# Patient Record
Sex: Male | Born: 1965 | Race: White | Hispanic: No | Marital: Single | Smoking: Never smoker
Health system: Southern US, Community
[De-identification: ages and names within clinical notes are randomized; demographics above are authoritative.]

## PROBLEM LIST (undated history)

## (undated) DIAGNOSIS — L509 Urticaria, unspecified: Secondary | ICD-10-CM

## (undated) DIAGNOSIS — N200 Calculus of kidney: Secondary | ICD-10-CM

## (undated) DIAGNOSIS — G4730 Sleep apnea, unspecified: Secondary | ICD-10-CM

## (undated) DIAGNOSIS — E785 Hyperlipidemia, unspecified: Secondary | ICD-10-CM

## (undated) DIAGNOSIS — K219 Gastro-esophageal reflux disease without esophagitis: Secondary | ICD-10-CM

## (undated) DIAGNOSIS — Z87442 Personal history of urinary calculi: Secondary | ICD-10-CM

## (undated) DIAGNOSIS — T7840XA Allergy, unspecified, initial encounter: Secondary | ICD-10-CM

## (undated) DIAGNOSIS — M1990 Unspecified osteoarthritis, unspecified site: Secondary | ICD-10-CM

## (undated) DIAGNOSIS — I10 Essential (primary) hypertension: Secondary | ICD-10-CM

## (undated) HISTORY — DX: Allergy, unspecified, initial encounter: T78.40XA

## (undated) HISTORY — DX: Gastro-esophageal reflux disease without esophagitis: K21.9

## (undated) HISTORY — DX: Hyperlipidemia, unspecified: E78.5

## (undated) HISTORY — DX: Calculus of kidney: N20.0

## (undated) HISTORY — DX: Urticaria, unspecified: L50.9

## (undated) HISTORY — PX: LAPAROSCOPIC GASTRIC BANDING: SHX1100

## (undated) HISTORY — PX: SPINE SURGERY: SHX786

## (undated) HISTORY — PX: COLONOSCOPY: SHX174

---

## 1987-09-01 HISTORY — PX: GANGLION CYST EXCISION: SHX1691

## 1998-10-11 ENCOUNTER — Emergency Department (HOSPITAL_COMMUNITY): Admission: EM | Admit: 1998-10-11 | Discharge: 1998-10-11 | Payer: Self-pay | Admitting: Emergency Medicine

## 1999-08-25 ENCOUNTER — Emergency Department (HOSPITAL_COMMUNITY): Admission: EM | Admit: 1999-08-25 | Discharge: 1999-08-25 | Payer: Self-pay | Admitting: Emergency Medicine

## 1999-08-25 ENCOUNTER — Encounter: Payer: Self-pay | Admitting: Emergency Medicine

## 2002-10-22 ENCOUNTER — Ambulatory Visit (HOSPITAL_BASED_OUTPATIENT_CLINIC_OR_DEPARTMENT_OTHER): Admission: RE | Admit: 2002-10-22 | Discharge: 2002-10-22 | Payer: Self-pay | Admitting: Family Medicine

## 2002-12-05 ENCOUNTER — Ambulatory Visit (HOSPITAL_BASED_OUTPATIENT_CLINIC_OR_DEPARTMENT_OTHER): Admission: RE | Admit: 2002-12-05 | Discharge: 2002-12-05 | Payer: Self-pay | Admitting: Family Medicine

## 2005-02-27 ENCOUNTER — Ambulatory Visit: Payer: Self-pay | Admitting: Oncology

## 2005-03-31 ENCOUNTER — Ambulatory Visit (HOSPITAL_COMMUNITY): Admission: RE | Admit: 2005-03-31 | Discharge: 2005-03-31 | Payer: Self-pay | Admitting: Oncology

## 2005-04-16 ENCOUNTER — Ambulatory Visit: Payer: Self-pay | Admitting: Internal Medicine

## 2005-05-18 ENCOUNTER — Ambulatory Visit: Payer: Self-pay | Admitting: Internal Medicine

## 2005-05-19 ENCOUNTER — Ambulatory Visit: Payer: Self-pay | Admitting: Internal Medicine

## 2005-06-01 ENCOUNTER — Encounter: Admission: RE | Admit: 2005-06-01 | Discharge: 2005-08-30 | Payer: Self-pay | Admitting: Internal Medicine

## 2005-07-29 ENCOUNTER — Ambulatory Visit: Payer: Self-pay | Admitting: Oncology

## 2005-08-10 ENCOUNTER — Ambulatory Visit: Payer: Self-pay | Admitting: Internal Medicine

## 2005-11-27 ENCOUNTER — Ambulatory Visit: Payer: Self-pay | Admitting: Oncology

## 2005-12-02 LAB — UIFE/LIGHT CHAINS/TP QN, 24-HR UR
Albumin, U: DETECTED
Free Kappa Lt Chains,Ur: 6.03 mg/dL — ABNORMAL HIGH (ref 0.04–1.51)
Free Kappa/Lambda Ratio: 25.13 ratio — ABNORMAL HIGH (ref 0.46–4.00)
Free Lambda Excretion/Day: 5.04 mg/d
Free Lambda Lt Chains,Ur: 0.24 mg/dL (ref 0.08–1.01)
Free Lt Chn Excr Rate: 126.63 mg/d
Time: 24 hours
Total Protein, Urine-Ur/day: 149 mg/d — ABNORMAL HIGH (ref 10–140)
Total Protein, Urine: 7.1 mg/dL
Volume, Urine: 2100 mL

## 2006-04-13 ENCOUNTER — Ambulatory Visit: Payer: Self-pay | Admitting: Internal Medicine

## 2006-04-15 ENCOUNTER — Ambulatory Visit: Payer: Self-pay | Admitting: Internal Medicine

## 2006-04-20 ENCOUNTER — Emergency Department (HOSPITAL_COMMUNITY): Admission: EM | Admit: 2006-04-20 | Discharge: 2006-04-20 | Payer: Self-pay | Admitting: Emergency Medicine

## 2006-04-27 ENCOUNTER — Emergency Department (HOSPITAL_COMMUNITY): Admission: EM | Admit: 2006-04-27 | Discharge: 2006-04-27 | Payer: Self-pay | Admitting: *Deleted

## 2006-05-18 ENCOUNTER — Ambulatory Visit: Payer: Self-pay | Admitting: Internal Medicine

## 2006-05-31 ENCOUNTER — Ambulatory Visit: Payer: Self-pay | Admitting: Internal Medicine

## 2006-06-10 ENCOUNTER — Ambulatory Visit: Payer: Self-pay | Admitting: Oncology

## 2006-09-09 ENCOUNTER — Ambulatory Visit: Payer: Self-pay | Admitting: Pulmonary Disease

## 2006-12-20 ENCOUNTER — Ambulatory Visit: Payer: Self-pay | Admitting: Oncology

## 2006-12-23 LAB — CBC WITH DIFFERENTIAL/PLATELET
Basophils Absolute: 0 10*3/uL (ref 0.0–0.1)
EOS%: 3 % (ref 0.0–7.0)
HCT: 36.7 % — ABNORMAL LOW (ref 38.7–49.9)
HGB: 13.1 g/dL (ref 13.0–17.1)
LYMPH%: 29.9 % (ref 14.0–48.0)
MCH: 30.8 pg (ref 28.0–33.4)
MCHC: 35.8 g/dL (ref 32.0–35.9)
MONO#: 0.5 10*3/uL (ref 0.1–0.9)
NEUT%: 57.3 % (ref 40.0–75.0)
Platelets: 138 10*3/uL — ABNORMAL LOW (ref 145–400)
lymph#: 1.4 10*3/uL (ref 0.9–3.3)

## 2006-12-27 LAB — IRON AND TIBC
%SAT: 24 % (ref 20–55)
Iron: 88 ug/dL (ref 42–165)
TIBC: 372 ug/dL (ref 215–435)

## 2006-12-27 LAB — SPEP & IFE WITH QIG
Albumin ELP: 62 % (ref 55.8–66.1)
Alpha-1-Globulin: 4 % (ref 2.9–4.9)
Beta 2: 5.4 % (ref 3.2–6.5)
Beta Globulin: 6.8 % (ref 4.7–7.2)
Gamma Globulin: 11.4 % (ref 11.1–18.8)

## 2006-12-27 LAB — FERRITIN: Ferritin: 179 ng/mL (ref 22–322)

## 2006-12-27 LAB — LACTATE DEHYDROGENASE: LDH: 76 U/L — ABNORMAL LOW (ref 94–250)

## 2006-12-27 LAB — KAPPA/LAMBDA LIGHT CHAINS: Kappa:Lambda Ratio: 1.52 (ref 0.26–1.65)

## 2007-09-04 ENCOUNTER — Emergency Department (HOSPITAL_COMMUNITY): Admission: EM | Admit: 2007-09-04 | Discharge: 2007-09-04 | Payer: Self-pay | Admitting: Emergency Medicine

## 2008-03-13 ENCOUNTER — Encounter: Payer: Self-pay | Admitting: Internal Medicine

## 2008-09-06 ENCOUNTER — Encounter: Payer: Self-pay | Admitting: Internal Medicine

## 2009-09-18 ENCOUNTER — Encounter: Admission: RE | Admit: 2009-09-18 | Discharge: 2009-10-16 | Payer: Self-pay | Admitting: Diagnostic Neuroimaging

## 2009-12-19 ENCOUNTER — Encounter: Payer: Self-pay | Admitting: Internal Medicine

## 2010-01-09 ENCOUNTER — Encounter: Admission: RE | Admit: 2010-01-09 | Discharge: 2010-01-09 | Payer: Self-pay | Admitting: Emergency Medicine

## 2010-09-30 NOTE — Consult Note (Signed)
Summary: [facility]  [facility]   Imported By: Sherian Rein 01/28/2010 08:00:46  _____________________________________________________________________  External Attachment:    Type:   Image     Comment:   External Document

## 2011-01-16 NOTE — Assessment & Plan Note (Signed)
[facility]                           GASTROENTEROLOGY OFFICE NOTE   [name], [name]                     MRN:          [id number]  DATE:05/18/2006                            DOB:          02-Feb-1966    CHIEF COMPLAINT:  Followup of abnormal LFTs.   [name] had some sensation of his throat closing up after I started  gemfibrozil.  It turns out that was probably allergies.  He went to the  emergency department and was treated and saw Dr. Lucie Leather and has now been  diagnosed with allergies and gastroesophageal reflux disease.  He is on  Nexium.  His previous workup has indicated a high ferritin and elevated  transaminases.  A hemochromatosis gene test was negative to my recollection.  I do not have that record with me right now.  I have explained this to him.  He is back on the gemfibrozil.  His other medications are listed and  reviewed in the chart.   PHYSICAL EXAMINATION:  VITAL SIGNS:  Weight 293 pounds, pulse 80, blood  pressure 120/76.   ASSESSMENT:  1. Fatty liver with abnormal transaminases, question steatohepatitis.  2. Elevated triglycerides/hypertriglyceridemia.  3. Diabetes mellitus.  4. Obesity.  5. Clinical scenario most compatible with metabolic syndrome and insulin      resistance problems.  I am concerned that he is at high risk of      cirrhosis and significant liver disease just based upon his lab      patterns.   PLAN:  1. Continue current medications.  2. Recheck lipids and hepatic function panel in early October.  3. I am going to make a referral to the [facility] to see what they      can do for his fatty liver.                                   Iva Boop, MD,FACG   CEG/MedQ  DD:  05/19/2006  DT:  05/21/2006  Job #:  [id number]   cc:   Harrel Lemon. Merla Riches, M.D.  Pierce Crane, M.D.

## 2011-01-16 NOTE — Assessment & Plan Note (Signed)
[facility]                             PULMONARY OFFICE NOTE   Juan Nelson, Juan Nelson                     MRN:          [id number]  DATE:09/09/2006                            DOB:          26-May-1966    HISTORY OF PRESENT ILLNESS:  The patient is a 45 year old gentleman who  I have been asked to see for management of obstructive sleep apnea.  The  patient was first diagnosed with sleep apnea in 2004 where he was found  to have respiratory disturbance index of 28 events per hour and O2  saturation as low as 79%.  He returned for a titration study on April  2004 where a pressure of 14 cm appeared to be optimal for him.  The  patient was placed on a CPAP at 12 centimeters and uses a nasal mask.  He does have a heated humidifier and knows how to use it.  The patient  has been keeping up with his mask changes.  The patient typically goes  to bed between 11 and 12, and gets up at 5 a.m. to start his day.  He is  fairly rested in the mornings whenever he arises.  He has worn his CPAP  now consistently for 2 years and feels fairly rested during the day.  The patient has no significant inappropriate daytime sleepiness and  teaches piano.  Note his weight is down about 60 pounds over the last 1  year.   PAST MEDICAL HISTORY:  1. History of hypertension.  2. History of dysrhythmias.  3. History of diabetes.  4. History of dyslipidemia.  5. History of allergic rhinitis.  6. History of sleep apnea as stated above.   CURRENT MEDICATIONS:  1. Phentermine 37.5 mg daily.  2. Toprol XL 50 mg daily.  3. Lasix 20 mg daily.  4. Metformin 1000 mg b.i.d.  5. B 12 daily.  6. Nexium 40 mg daily.  7. Nasonex nasal spray daily.  8. Aspirin 81 mg daily.  9. Gemfibrozil 600 mg daily.  10.Various other vitamins.  11.Lipitor 10 mg daily.  12.Zantac 300 mg nightly.   PATIENT HAS NO KNOWN DRUG ALLERGIES.   SOCIAL HISTORY:  He is single, he has never smoked.   FAMILY HISTORY:  Remarkable for no diseases in first degree relatives.   REVIEW OF SYSTEMS:  As per history of present illness.  Also see patient  intake form documented in the chart.   PHYSICAL EXAMINATION:  GENERAL:  He is a morbidly obese male in no acute  distress.  Blood pressure is 128/74, pulse 73, temperature 97.8, weight  is 282 pounds, his saturation on room air is 97%.  HEENT:  Pupils equal, round, reactive to light and accommodation,  extraocular muscles are intact.  Nares shows deviated septum to the left  with turbinate hypertrophy.  Oropharynx shows elongation of the soft  palate and uvula with side wall narrowing.  NECK:  Supple without JVD or lymphadenopathy.  There is no palpable  thyromegaly.  CHEST:  Totally clear.  CARDIAC:  Reveals regular rate and rhythm, no murmur, rubs, or  gallops.  ABDOMEN:  Soft, nontender, with good bowel sounds.  GENITAL/RECTAL: Was not done and not indicated.  LOWER EXTREMITIES:  Were without edema, pulses were intact distally.  NEUROLOGICAL:  Alert and oriented with no obvious motor deficits.   IMPRESSION:  History of moderate obstructive sleep apnea, for which the  patient is on CPAP.  He seems to be doing fairly well with this and  feels that he is adequately rested and has excellent daytime alertness.  The patient has done fairly well with weight loss and I have told him to  continue doing so.  Since he has lost this weight and has not had his  pressure checked in 4 years it may be worthwhile to do an auto set  machine with download for pressure optimization.  The patient is  interested in this.   PLAN:  1. Auto set device for 2 weeks with download and I will let the      patient know his new optimal pressure.  2. Work on weight loss.  3. The patient will follow up in 12 months or sooner if there is      problems.     Barbaraann Share, MD,FCCP  Electronically Signed    [facility]/MedQ  DD: 09/10/2006  DT: 09/11/2006  Job #:  [id number]   cc:   Iva Boop, MD,FACG

## 2011-01-16 NOTE — Assessment & Plan Note (Signed)
[facility]                                   ON-CALL NOTE   Juan Nelson, Juan Nelson                       MRN:          [id number]  DATE:04/23/2006                            DOB:          May 12, 1966    The reason [name] called stating that he believes he is having an  allergic reaction to his Gemfibrozil.  According to the patient, Dr. Leone Payor  placed him on the Gemfibrozil because of high triglycerides.  He apparently  has a liver problem.  He has had throat swelling and this at times has not  been able to swallow.  He claims to have been to the emergency room and to  his family practitioner who did not diagnosis this problem.  He read that  this could be an allergic reaction.  He does not have a skin rash.  I  advised [name] to discontinue the Gemfibrozil in the event that he may  be having an allergic reaction, although I am not certain whether or not  this is so.  I instructed him to contact Dr. Leone Payor next week to discuss  this further.                                   Barbette Hair. Arlyce Dice, MD, [facility]   RDK/MedQ  DD:  04/23/2006  DT:  04/24/2006  Job #:  [id number]   cc:   Iva Boop, MD, [facility]

## 2011-01-20 NOTE — Assessment & Plan Note (Signed)
[facility]                           GASTROENTEROLOGY OFFICE NOTE   Juan Nelson, Juan Nelson                     MRN:          [id number]  DATE:04/13/2006                            DOB:          07-12-1966    CHIEF COMPLAINT:  Abnormal liver chemistries.   Please see my medical history & physical form for full details of this  visit.   ASSESSMENT:  Juan Nelson has recurrent and chronic abnormal  transaminase's.  Etiology is thought to be due to fatty liver problems.  Serologic workup has been unrevealing in the past and they have improved  over time.  On 08/10/2005 he had an ALT of 58, but otherwise normal hepatic  function panel.  On 03/18/2006 with lab work at [facility] he had an AST of 48, ALT 100.  His glucose was 343.  His triglycerides  were 580, HDL 23.  His TSH was normal.  He was started on Metformin and his  blood sugars have come down into the mid 100 range he tells me.  His other  medications include Lisinopril, Toprol XL, Lasix, aspirin, Phentermine,  multivitamin, and now he is on liver for supplementation, fish oil tabs,  chromium supplement, apple cider vinegar, cinnamon 4 supplement, Biotin, and  vitamin B12 p.o.   ALLERGIES:  There are no known drug allergies.   PAST MEDICAL HISTORY:  Include monoclonal gammopathy of unknown significance  and obesity as well as his diagnosis of diabetes.  He has had  thrombocytopenia in the past of unclear etiology.  He has had a liver upper  limits of normal on CT scan and a ferritin level of 928.  Sleep apnea is  possible.  He has had hypertension.   His serologic workup has included negative Ceruloplasmin and negative ANA,  negative hepatitis B surface antigen, hepatitis C antibody, negative  hepatitis B surface antibody.  He has been anemic in the past as well.   I had planned for a repeat ferritin in this patient as his ferritin was over  900 but less than 1,000  previously.  That has not been done as best I can  tell.   PHYSICAL EXAMINATION:  VITAL SIGNS:  Physical exam shows weight 296 pounds,  pulse 88, blood pressure 138/86.  EYES:  Anicteric.   ASSESSMENT:  1. CT scan has suggested fatty liver and he is a set up for metabolic      syndrome.  The elevated ferritin which I have not yet followed up on      does raise the question of hemochromatosis and we need to test for      that.  I have not explained that to him today as I have realized that      after he has left the office, but I will call him and we will set that      up.  2. I started Lopid 600 mg two times daily (generic gemfibrozil) due to his      hypertriglyceridemia which may in part have been due to his diabetes.  3. He really  needs to stop the supplements.   Further plans pending the above results.  Depending upon what the  hemochromatosis testing shows he may or may not need a referral to a  tertiary liver center, I was thinking of referring him to [facility] regarding  possible fatty liver therapy as they have a research center there regarding  that.                                   Iva Boop, MD, [facility]   CEG/MedQ  DD:  04/13/2006  DT:  04/14/2006  Job #:  [id number]   cc:   Ellamae Sia, M.D.  Pierce Crane, MD

## 2011-05-21 LAB — POCT CARDIAC MARKERS
CKMB, poc: 9.4
Myoglobin, poc: 224
Myoglobin, poc: 242
Operator id: 151321

## 2011-05-21 LAB — D-DIMER, QUANTITATIVE: D-Dimer, Quant: <0.22

## 2011-09-13 ENCOUNTER — Ambulatory Visit (INDEPENDENT_AMBULATORY_CARE_PROVIDER_SITE_OTHER): Payer: BC Managed Care – PPO

## 2011-09-13 DIAGNOSIS — G4700 Insomnia, unspecified: Secondary | ICD-10-CM

## 2011-09-13 DIAGNOSIS — M109 Gout, unspecified: Secondary | ICD-10-CM

## 2011-09-13 DIAGNOSIS — I10 Essential (primary) hypertension: Secondary | ICD-10-CM

## 2012-04-20 ENCOUNTER — Emergency Department (HOSPITAL_COMMUNITY)
Admission: EM | Admit: 2012-04-20 | Discharge: 2012-04-20 | Disposition: A | Discharge status: Home / Self Care | Payer: BC Managed Care – PPO | Attending: Emergency Medicine | Admitting: Emergency Medicine

## 2012-04-20 ENCOUNTER — Encounter (HOSPITAL_COMMUNITY): Payer: Self-pay | Admitting: Emergency Medicine

## 2012-04-20 DIAGNOSIS — X58XXXA Exposure to other specified factors, initial encounter: Secondary | ICD-10-CM | POA: Insufficient documentation

## 2012-04-20 DIAGNOSIS — T7840XA Allergy, unspecified, initial encounter: Secondary | ICD-10-CM | POA: Insufficient documentation

## 2012-04-20 HISTORY — DX: Essential (primary) hypertension: I10

## 2012-04-20 NOTE — ED Notes (Signed)
Pt c/o allergic reaction with hives after eating at [facility]; pt sts took 50 mg benadryl 30 min ago; not SOB or resp distress

## 2012-08-29 ENCOUNTER — Other Ambulatory Visit: Payer: Self-pay | Admitting: Family Medicine

## 2012-08-30 NOTE — Telephone Encounter (Signed)
Please pull chart.

## 2012-09-01 NOTE — Telephone Encounter (Signed)
Needs OV /labs

## 2012-09-01 NOTE — Telephone Encounter (Signed)
Chart pulled to PA pool at nurses station [phone]

## 2012-10-08 ENCOUNTER — Ambulatory Visit (INDEPENDENT_AMBULATORY_CARE_PROVIDER_SITE_OTHER): Payer: BC Managed Care – PPO | Admitting: Emergency Medicine

## 2012-10-08 VITALS — BP 143/77 | HR 88 | Temp 98.2°F | Resp 18 | Ht 73.0 in | Wt 230.0 lb

## 2012-10-08 DIAGNOSIS — Z7721 Contact with and (suspected) exposure to potentially hazardous body fluids: Secondary | ICD-10-CM

## 2012-10-08 DIAGNOSIS — E669 Obesity, unspecified: Secondary | ICD-10-CM

## 2012-10-08 DIAGNOSIS — R7309 Other abnormal glucose: Secondary | ICD-10-CM

## 2012-10-08 DIAGNOSIS — R739 Hyperglycemia, unspecified: Secondary | ICD-10-CM

## 2012-10-08 DIAGNOSIS — R7989 Other specified abnormal findings of blood chemistry: Secondary | ICD-10-CM

## 2012-10-08 DIAGNOSIS — I10 Essential (primary) hypertension: Secondary | ICD-10-CM

## 2012-10-08 LAB — POCT CBC
Hemoglobin: 13.6 g/dL — AB (ref 14.1–18.1)
Lymph, poc: 1.7 (ref 0.6–3.4)
MCH, POC: 30 pg (ref 27–31.2)
MCHC: 33.3 g/dL (ref 31.8–35.4)
MCV: 90.3 fL (ref 80–97)
MID (cbc): 0.4 (ref 0–0.9)
MPV: 10.4 fL (ref 0–99.8)
POC MID %: 6.1 %M (ref 0–12)
WBC: 5.9 10*3/uL (ref 4.6–10.2)

## 2012-10-08 LAB — COMPREHENSIVE METABOLIC PANEL
Albumin: 4.7 g/dL (ref 3.5–5.2)
Alkaline Phosphatase: 102 U/L (ref 39–117)
BUN: 19 mg/dL (ref 6–23)
CO2: 28 mEq/L (ref 19–32)
Calcium: 9.4 mg/dL (ref 8.4–10.5)
Glucose, Bld: 92 mg/dL (ref 70–99)
Potassium: 4.2 mEq/L (ref 3.5–5.3)

## 2012-10-08 LAB — RPR

## 2012-10-08 LAB — HIV ANTIBODY (ROUTINE TESTING W REFLEX): HIV: NONREACTIVE

## 2012-10-08 MED ORDER — LISINOPRIL 5 MG PO TABS: 5.0000 mg | ORAL_TABLET | Freq: Every day | ORAL | Status: DC

## 2012-10-08 NOTE — Progress Notes (Signed)
  Subjective:    Patient ID: Juan Nelson, male    DOB: 06-Aug-1966, 47 y.o.   MRN: [id number]  HPI patient here for blood pressure medicine refill. He is followed by a GI specialist at [facility]. He is having difficulty with significant elevation in his LFTs. He also was diabetic. Had weight loss surgery and since then he has lost over 200 pounds of weight. His medical problems have significantly improved since he had the surgery. Patient had LAP-BAND surgery. He is doing very well. He's had no side effects from his blood pressure medications he did have an allergic reaction since he was last here pumpkin seeds but otherwise has done exceedingly well    Review of Systems     Objective:   Physical Exam HEENT exam is unremarkable. Neck supple. Chest is clear to auscultation and percussion. Heart regular rate no murmurs. Abdomen soft liver spleen not large there no tenderness .  Results for orders placed in visit on 10/08/12  POCT CBC      Result Value Range   WBC 5.9  4.6 - 10.2 K/uL   Lymph, poc 1.7  0.6 - 3.4   POC LYMPH PERCENT 28.6  10 - 50 %L   MID (cbc) 0.4  0 - 0.9   POC MID % 6.1  0 - 12 %M   POC Granulocyte 3.9  2 - 6.9   Granulocyte percent 65.3  37 - 80 %G   RBC 4.53 (*) 4.69 - 6.13 M/uL   Hemoglobin 13.6 (*) 14.1 - 18.1 g/dL   HCT, POC 40.9 (*) 81.1 - 53.7 %   MCV 90.3  80 - 97 fL   MCH, POC 30.0  27 - 31.2 pg   MCHC 33.3  31.8 - 35.4 g/dL   RDW, POC 91.4     Platelet Count, POC 171  142 - 424 K/uL   MPV 10.4  0 - 99.8 fL  GLUCOSE, POCT (MANUAL RESULT ENTRY)      Result Value Range   POC Glucose 88  70 - 99 mg/dl  POCT GLYCOSYLATED HEMOGLOBIN (HGB A1C)      Result Value Range   Hemoglobin A1C 4.7          Assessment & Plan:  Patient has not had STD testing for a number of years. He is requesting these be done now. He has a history of hyperglycemia when he was overweight but this has resolved since LAP-BAND treatment. Routine labs were done today.

## 2013-02-15 ENCOUNTER — Ambulatory Visit (INDEPENDENT_AMBULATORY_CARE_PROVIDER_SITE_OTHER): Payer: BC Managed Care – PPO | Admitting: Physician Assistant

## 2013-02-15 VITALS — BP 128/90 | HR 78 | Temp 98.0°F | Resp 16 | Ht 73.0 in | Wt 232.0 lb

## 2013-02-15 DIAGNOSIS — H109 Unspecified conjunctivitis: Secondary | ICD-10-CM

## 2013-02-15 DIAGNOSIS — H1089 Other conjunctivitis: Secondary | ICD-10-CM

## 2013-02-15 MED ORDER — OFLOXACIN 0.3 % OP SOLN: 1.0000 [drp] | Freq: Four times a day (QID) | OPHTHALMIC | Status: DC

## 2013-02-15 NOTE — Progress Notes (Signed)
  Subjective:    Patient ID: Juan Nelson, male    DOB: 1966-06-10, 47 y.o.   MRN: [id number]  HPI 47 year old male presents with 3 day history or right eye erythema, irritation, FB sensation, and drainage. States symptoms started on 02/13/13 with slight eye redness and has progressively worsened. Admits to a lot of tearing and some purulent drainage with crusting.  No globe pain, vision changes, headache, periorbital pain, or dizziness. He is a contact wearer but he alternates between glasses and contacts - has not worn his contacts in 2-3 weeks.  Patient otherwise healthy with no other concerns today    Review of Systems  Constitutional: Negative for fever and chills.  HENT: Positive for rhinorrhea. Negative for congestion, sore throat and postnasal drip.   Eyes: Positive for discharge, redness and itching. Negative for photophobia, pain and visual disturbance.  Gastrointestinal: Negative for nausea and vomiting.  Neurological: Negative for dizziness and headaches.       Objective:   Physical Exam  Constitutional: He is oriented to person, place, and time. He appears well-developed and well-nourished.  HENT:  Head: Normocephalic and atraumatic.  Right Ear: Hearing, tympanic membrane, external ear and ear canal normal.  Left Ear: Hearing, tympanic membrane, external ear and ear canal normal.  Mouth/Throat: Uvula is midline, oropharynx is clear and moist and mucous membranes are normal.  Eyes: EOM are normal. Pupils are equal, round, and reactive to light. Right conjunctiva is injected. Right conjunctiva has no hemorrhage. Left conjunctiva is not injected. Left conjunctiva has no hemorrhage.  Right eyelid slightly swollen with evidence of crusting and drainage  Neck: Normal range of motion.  Cardiovascular: Normal rate.   Pulmonary/Chest: Effort normal.  Neurological: He is alert and oriented to person, place, and time.  Psychiatric: He has a normal mood and affect. His behavior is  normal. Judgment and thought content normal.          Assessment & Plan:  Bacterial conjunctivitis - Plan: ofloxacin (OCUFLOX) 0.3 % ophthalmic solution  Start Ocuflox qid x 7 days. Warm compresses as needed to help with drainage RTC if symptoms worsen or fail to improve - especially if any vision changes, eye pain, or dizziness occurs.

## 2013-02-15 NOTE — Patient Instructions (Addendum)

## 2013-05-06 ENCOUNTER — Ambulatory Visit (INDEPENDENT_AMBULATORY_CARE_PROVIDER_SITE_OTHER): Payer: BC Managed Care – PPO | Admitting: Family Medicine

## 2013-05-06 ENCOUNTER — Ambulatory Visit: Payer: BC Managed Care – PPO

## 2013-05-06 VITALS — BP 142/90 | HR 86 | Temp 98.4°F | Resp 18 | Ht 72.75 in | Wt 237.0 lb

## 2013-05-06 DIAGNOSIS — M25562 Pain in left knee: Secondary | ICD-10-CM

## 2013-05-06 DIAGNOSIS — M25569 Pain in unspecified knee: Secondary | ICD-10-CM

## 2013-05-06 MED ORDER — DICLOFENAC SODIUM 75 MG PO TBEC: 75.0000 mg | DELAYED_RELEASE_TABLET | Freq: Two times a day (BID) | ORAL | Status: DC

## 2013-05-06 NOTE — Patient Instructions (Signed)
Take the diclofenac twice daily with food  If not improving in a few weeks return and we can inject with cortisone.  Return sooner if needed.

## 2013-05-06 NOTE — Progress Notes (Signed)
Subjective: Worked out vigorously several weeks ago, and has persisting knee pain since when on it or sitting too long.  Miuscles were tight both legs , but right okay now.  No major hx injuries.  Lots of crepitance.  Objective: Crepitance both knees.  No swelling or effusion. Bruise medial left knee ? Etiology.  Good ROM and strength.  Assesment: Knee pain  Plan: Xray [facility] reading (PRIMARY) by  Dr. Alwyn Ren Normal knee  Diclofenac 50 bid.

## 2013-05-07 ENCOUNTER — Telehealth: Payer: Self-pay | Admitting: Family Medicine

## 2013-05-07 NOTE — Telephone Encounter (Signed)
I tried to call the patient back regarding his x-ray report. I left a message on his answering machine for him. The patella is mildly subluxated laterally, but to me that did not seem to be the problem. I don't think that that is a major issue really but told him that if he keeps having problems we'll send him to an orthopedist. He is coming back if needed.

## 2013-05-08 ENCOUNTER — Encounter: Payer: Self-pay | Admitting: Family Medicine

## 2013-05-09 ENCOUNTER — Telehealth: Payer: Self-pay

## 2013-05-09 DIAGNOSIS — M25569 Pain in unspecified knee: Secondary | ICD-10-CM

## 2013-05-09 NOTE — Telephone Encounter (Signed)
Sent referral, to you [facility]

## 2013-05-09 NOTE — Telephone Encounter (Signed)
Pt is calling to let dr hopper know that yes he does want the referral to [facility] dr Despina Hick or [place]   Best number [phone]

## 2013-10-06 ENCOUNTER — Other Ambulatory Visit: Payer: Self-pay | Admitting: Emergency Medicine

## 2013-10-09 ENCOUNTER — Other Ambulatory Visit: Payer: Self-pay | Admitting: Emergency Medicine

## 2013-10-13 ENCOUNTER — Ambulatory Visit: Payer: BC Managed Care – PPO | Admitting: Physician Assistant

## 2013-10-13 VITALS — BP 118/72 | HR 77 | Temp 98.3°F | Resp 16 | Ht 72.75 in | Wt 237.0 lb

## 2013-10-13 DIAGNOSIS — M1710 Unilateral primary osteoarthritis, unspecified knee: Secondary | ICD-10-CM

## 2013-10-13 DIAGNOSIS — IMO0002 Reserved for concepts with insufficient information to code with codable children: Secondary | ICD-10-CM

## 2013-10-13 DIAGNOSIS — I10 Essential (primary) hypertension: Secondary | ICD-10-CM

## 2013-10-13 DIAGNOSIS — Z87442 Personal history of urinary calculi: Secondary | ICD-10-CM

## 2013-10-13 DIAGNOSIS — M170 Bilateral primary osteoarthritis of knee: Secondary | ICD-10-CM

## 2013-10-13 DIAGNOSIS — J309 Allergic rhinitis, unspecified: Secondary | ICD-10-CM

## 2013-10-13 MED ORDER — DICLOFENAC SODIUM 75 MG PO TBEC: 75.0000 mg | DELAYED_RELEASE_TABLET | Freq: Two times a day (BID) | ORAL | Status: DC | PRN

## 2013-10-13 MED ORDER — TAMSULOSIN HCL 0.4 MG PO CAPS: 0.4000 mg | ORAL_CAPSULE | Freq: Every day | ORAL | Status: DC | PRN

## 2013-10-13 MED ORDER — FLUTICASONE PROPIONATE 50 MCG/ACT NA SUSP: 2.0000 | Freq: Every day | NASAL | Status: DC

## 2013-10-13 MED ORDER — LISINOPRIL 5 MG PO TABS: 5.0000 mg | ORAL_TABLET | Freq: Every day | ORAL | Status: DC

## 2013-10-13 NOTE — Patient Instructions (Signed)
Continue lisinopril as directed.  Use the diclofenac if needed for knee pain  Try the fluticasone (Flonase) nasal spray - 2 sprays each nostril once daily.  This will help control your allergies with consistent daily use.  Let us know how this is going - there are other nasal sprays that we could try if not getting good response with this.  You can continue loratadine daily or as needed

## 2013-10-13 NOTE — Progress Notes (Signed)
Subjective:    Patient ID: Juan Nelson, male    DOB: [birth year], 48 y.o.   MRN: [id number]  HPI   Juan Nelson is a very pleasant 48 yr old male here for medication refills.  (1) Lisinopril - has taken for several years.  Tolerates medication well.  Does not check home BPs.  BP is WNL today.  He denies CP, palpitations, SOB, edema.  Recent labs viewable in Epic from [facility] - normal electrolytes, Cr  (2)  Flomax - history of renal stones, has had 4-5 episodes of this in the past.  Would like to have some of this on hand if needed.  Denies urinary symptoms currently.  Is noticing that he is having to get up 1-2 times per night to urinate which is bothersome.  He is thinking of trying saw palmetto for this.  (3)  Diclofenac - significant OA of bilateral knees.  Has seen ortho for this.  Limits exercises to low impact activities.  Has taken Diclofenac in the past with good results, would like RF for prn use.  (4)  Has had worsening allergic rhinitis symptoms recently.  Takes loratadine prn but without complete relief.  Open to trying a nasal steroid  Identifies [facility] as PCP.  No recent CPE in Epic, but notes viewable from [facility] where he has had extensive lab work up in last 6 months    Review of Systems  Constitutional: Negative for fever and chills.  HENT: Positive for congestion and rhinorrhea.   Respiratory: Negative.   Cardiovascular: Negative for chest pain, palpitations and leg swelling.  Gastrointestinal: Negative.   Musculoskeletal: Positive for arthralgias (bilat knees).       Objective:   Physical Exam  Vitals reviewed. Constitutional: He is oriented to person, place, and time. He appears well-developed and well-nourished. No distress.  HENT:  Head: Normocephalic and atraumatic.  Eyes: Conjunctivae are normal. No scleral icterus.  Cardiovascular: Normal rate, regular rhythm, normal heart sounds and intact distal pulses.   Pulmonary/Chest: Effort normal and  breath sounds normal.  Abdominal: Soft. There is no tenderness.  Neurological: He is alert and oriented to person, place, and time.  Skin: Skin is warm and dry.  Psychiatric: He has a normal mood and affect. His behavior is normal.       Assessment & Plan:   1. Hypertension Well controlled on current medication.  With normal metabolic panel at [facility] within the last 6 months will not repeat labs today.   - lisinopril (PRINIVIL,ZESTRIL) 5 MG tablet; Take 1 tablet (5 mg total) by mouth daily.  Dispense: 90 tablet; Refill: 1  2. Osteoarthritis of both knees Ongoing bilateral knee pain.  Improved with avoidance of high impact exercise.  May use diclofenac prn. - diclofenac (VOLTAREN) 75 MG EC tablet; Take 1 tablet (75 mg total) by mouth 2 (two) times daily as needed.  Dispense: 30 tablet; Refill: 1  3. History of kidney stones Will refill Flomax per pt request. - tamsulosin (FLOMAX) 0.4 MG CAPS capsule; Take 1 capsule (0.4 mg total) by mouth daily as needed.  Dispense: 30 capsule; Refill: 0  4. Allergic rhinitis Uncontrolled allergic symptoms.  Continue loratadine daily.  Add Flonase daily.  Pt to report back on Flonase efficacy in the next few weeks - can always try a different nasal steroid +/- azelastine if incomplete relief of symptoms - fluticasone (FLONASE) 50 MCG/ACT nasal spray; Place 2 sprays into both nostrils daily.  Dispense: 16 g; Refill:  5  Pt to call or RTC if worsening or not improving  E. Frances FurbishElizabeth Kihanna Kamiya MHS, PA-C [facility] 2/14/20151:27 PM

## 2013-12-02 ENCOUNTER — Other Ambulatory Visit: Payer: Self-pay | Admitting: Physician Assistant

## 2013-12-13 ENCOUNTER — Other Ambulatory Visit: Payer: Self-pay | Admitting: Physician Assistant

## 2014-01-11 ENCOUNTER — Other Ambulatory Visit: Payer: Self-pay | Admitting: Physician Assistant

## 2014-04-18 ENCOUNTER — Ambulatory Visit (INDEPENDENT_AMBULATORY_CARE_PROVIDER_SITE_OTHER): Payer: BC Managed Care – PPO | Admitting: Family Medicine

## 2014-04-18 VITALS — BP 126/84 | HR 72 | Temp 98.6°F | Resp 16 | Ht 77.0 in | Wt 234.0 lb

## 2014-04-18 DIAGNOSIS — Z87442 Personal history of urinary calculi: Secondary | ICD-10-CM

## 2014-04-18 DIAGNOSIS — I10 Essential (primary) hypertension: Secondary | ICD-10-CM | POA: Insufficient documentation

## 2014-04-18 MED ORDER — TAMSULOSIN HCL 0.4 MG PO CAPS: ORAL_CAPSULE | ORAL | Status: DC

## 2014-04-18 MED ORDER — LISINOPRIL 5 MG PO TABS: ORAL_TABLET | ORAL | Status: DC

## 2014-04-18 NOTE — Progress Notes (Signed)
  Juan Nelson - 48 y.o. male MRN [id number]  Date of birth: 1966-02-23  SUBJECTIVE:  Including CC & ROS.  patient C/O: Med Refills  HPI Juan Nelson is a very pleasant 48 yr old male here for medication refills. History of lag band surgery 4 years ago and [facility] Roney MarionAbdel Malik. [facility] unremarkable with normal LFTs, Normal A1c, and Normal Cr.  (1) Lisinopril - has taken for several years. Tolerates medication well. Does not check home BPs. BP is WNL today. He denies CP, palpitations, SOB, edema.  (2) Flomax - history of renal stones, has had 4-5 episodes of this in the past. Only uses for emergency (3) Diclofenac - history OA of bilateral knees. Has seen ortho for this. Limits exercises to low impact activities. Has taken Diclofenac in the past with good results, would like RF for prn use.    ROS:  Constitutional:  No fever, chills, or fatigue.  Respiratory:  No shortness of breath, cough, or wheezing Cardiovascular:  No palpitations, chest pain or syncope Gastrointestinal:  No nausea, no abdominal pain Review of systems otherwise negative except for what is stated in HPI  HISTORY: Past Medical, Surgical, Social, and Family History Reviewed & Updated per EMR. Pertinent Historical Findings include: History of obesity, HTN well controlled, DM resolved, kidney stones, and bilateral knee OA. No tobacco abuse  PHYSICAL EXAM:  VS: BP:126/84 mmHg  HR:72bpm  TEMP:98.6 F (37 C)(Oral)  RESP:97 %  HT:6\' 5"  (195.6 cm)   WT:234 lb (106.142 kg)  BMI:27.8 PHYSICAL EXAM: General:  Alert and oriented, No acute distress.   HENT:  Normocephalic, Oral mucosa is moist.   Respiratory:  Lungs are Nelson to auscultation, Respirations are non-labored, Symmetrical chest wall expansion.   Cardiovascular:  Normal rate, Regular rhythm, No murmur, Good pulses equal in all extremities, No edema.   Gastrointestinal:  Soft,  Non-tender, Non-distended, Normal bowel sounds, No organomegaly.   Integumentary:  Warm, Dry, No rash.   Neurologic:  Alert, Oriented, No focal defects Psychiatric:  Cooperative, Appropriate mood & affect.    ASSESSMENT & PLAN:  1. Hypertension: very well controlled has been on low dose for years. Has normal BP at home and in the office. Recommended stopping Lisinopril 5mg  for a month check BP 2-3 times a week. If BP remains <130/ <80 he can stop medication completely. He has no DM or renal disease indicated needed of ACE-I otherwise.  Continue 81mg  ASA daily for CAD protection, may started fish oil 1-2g daily for heart health if desired based on patient's request 2. Osteoarthritis of both knees: Intermittent bilateral knee pain. Improved with avoidance of high impact exercise. May use diclofenac prn.  3. History of kidney stones Will refill Flomax per pt request.

## 2014-04-20 NOTE — Progress Notes (Signed)
Patient discussed with Dr. Didiano. Agree with assessment and plan of care per her note.   

## 2014-10-14 ENCOUNTER — Other Ambulatory Visit: Payer: Self-pay | Admitting: Sports Medicine

## 2015-04-08 ENCOUNTER — Other Ambulatory Visit: Payer: Self-pay | Admitting: Internal Medicine

## 2015-04-10 ENCOUNTER — Ambulatory Visit (INDEPENDENT_AMBULATORY_CARE_PROVIDER_SITE_OTHER): Payer: BLUE CROSS/BLUE SHIELD | Admitting: Urgent Care

## 2015-04-10 VITALS — BP 136/92 | HR 77 | Temp 98.3°F | Resp 16 | Ht 77.0 in | Wt 230.0 lb

## 2015-04-10 DIAGNOSIS — Z113 Encounter for screening for infections with a predominantly sexual mode of transmission: Secondary | ICD-10-CM

## 2015-04-10 DIAGNOSIS — I10 Essential (primary) hypertension: Secondary | ICD-10-CM | POA: Diagnosis not present

## 2015-04-10 DIAGNOSIS — N200 Calculus of kidney: Secondary | ICD-10-CM

## 2015-04-10 LAB — COMPLETE METABOLIC PANEL WITH GFR
ALBUMIN: 4.5 g/dL (ref 3.6–5.1)
ALT: 42 U/L (ref 9–46)
AST: 30 U/L (ref 10–35)
Alkaline Phosphatase: 84 U/L (ref 40–115)
BILIRUBIN TOTAL: 0.8 mg/dL (ref 0.2–1.2)
BUN: 13 mg/dL (ref 7–25)
CO2: 26 mmol/L (ref 20–31)
Calcium: 9.7 mg/dL (ref 8.6–10.3)
Chloride: 101 mmol/L (ref 98–110)
Creat: 1.04 mg/dL (ref 0.70–1.33)
GFR, EST NON AFRICAN AMERICAN: 83 mL/min (ref >=60)
GLUCOSE: 77 mg/dL (ref 65–99)
Potassium: 4 mmol/L (ref 3.5–5.3)
SODIUM: 141 mmol/L (ref 135–146)
TOTAL PROTEIN: 6.8 g/dL (ref 6.1–8.1)

## 2015-04-10 MED ORDER — LISINOPRIL 10 MG PO TABS: 10.0000 mg | ORAL_TABLET | Freq: Every day | ORAL | Status: DC

## 2015-04-10 NOTE — Patient Instructions (Signed)
Hypertension Hypertension, commonly called high blood pressure, is when the force of blood pumping through your arteries is too strong. Your arteries are the blood vessels that carry blood from your heart throughout your body. A blood pressure reading consists of a higher number over a lower number, such as 110/72. The higher number (systolic) is the pressure inside your arteries when your heart pumps. The lower number (diastolic) is the pressure inside your arteries when your heart relaxes. Ideally you want your blood pressure below 120/80. Hypertension forces your heart to work harder to pump blood. Your arteries may become narrow or stiff. Having hypertension puts you at risk for heart disease, stroke, and other problems.  RISK FACTORS Some risk factors for high blood pressure are controllable. Others are not.  Risk factors you cannot control include:   Race. You may be at higher risk if you are African American.  Age. Risk increases with age.  Gender. Men are at higher risk than women before age 45 years. After age 65, women are at higher risk than men. Risk factors you can control include:  Not getting enough exercise or physical activity.  Being overweight.  Getting too much fat, sugar, calories, or salt in your diet.  Drinking too much alcohol. SIGNS AND SYMPTOMS Hypertension does not usually cause signs or symptoms. Extremely high blood pressure (hypertensive crisis) may cause headache, anxiety, shortness of breath, and nosebleed. DIAGNOSIS  To check if you have hypertension, your health care provider will measure your blood pressure while you are seated, with your arm held at the level of your heart. It should be measured at least twice using the same arm. Certain conditions can cause a difference in blood pressure between your right and left arms. A blood pressure reading that is higher than normal on one occasion does not mean that you need treatment. If one blood pressure reading  is high, ask your health care provider about having it checked again. TREATMENT  Treating high blood pressure includes making lifestyle changes and possibly taking medicine. Living a healthy lifestyle can help lower high blood pressure. You may need to change some of your habits. Lifestyle changes may include:  Following the DASH diet. This diet is high in fruits, vegetables, and whole grains. It is low in salt, red meat, and added sugars.  Getting at least 2 hours of brisk physical activity every week.  Losing weight if necessary.  Not smoking.  Limiting alcoholic beverages.  Learning ways to reduce stress. If lifestyle changes are not enough to get your blood pressure under control, your health care provider may prescribe medicine. You may need to take more than one. Work closely with your health care provider to understand the risks and benefits. HOME CARE INSTRUCTIONS  Have your blood pressure rechecked as directed by your health care provider.   Take medicines only as directed by your health care provider. Follow the directions carefully. Blood pressure medicines must be taken as prescribed. The medicine does not work as well when you skip doses. Skipping doses also puts you at risk for problems.   Do not smoke.   Monitor your blood pressure at home as directed by your health care provider. SEEK MEDICAL CARE IF:   You think you are having a reaction to medicines taken.  You have recurrent headaches or feel dizzy.  You have swelling in your ankles.  You have trouble with your vision. SEEK IMMEDIATE MEDICAL CARE IF:  You develop a severe headache or confusion.    You have unusual weakness, numbness, or feel faint.  You have severe chest or abdominal pain.  You vomit repeatedly.  You have trouble breathing. MAKE SURE YOU:   Understand these instructions.  Will watch your condition.  Will get help right away if you are not doing well or get worse. Document  Released: 08/17/2005 Document Revised: 01/01/2014 Document Reviewed: 06/09/2013 ExitCare Patient Information 2015 [employer]. This information is not intended to replace advice given to you by your health care provider. Make sure you discuss any questions you have with your health care provider. Kidney Stones Kidney stones (urolithiasis) are deposits that form inside your kidneys. The intense pain is caused by the stone moving through the urinary tract. When the stone moves, the ureter goes into spasm around the stone. The stone is usually passed in the urine.  CAUSES   A disorder that makes certain neck glands produce too much parathyroid hormone (primary hyperparathyroidism).  A buildup of uric acid crystals, similar to gout in your joints.  Narrowing (stricture) of the ureter.  A kidney obstruction present at birth (congenital obstruction).  Previous surgery on the kidney or ureters.  Numerous kidney infections. SYMPTOMS   Feeling sick to your stomach (nauseous).  Throwing up (vomiting).  Blood in the urine (hematuria).  Pain that usually spreads (radiates) to the groin.  Frequency or urgency of urination. DIAGNOSIS   Taking a history and physical exam.  Blood or urine tests.  CT scan.  Occasionally, an examination of the inside of the urinary bladder (cystoscopy) is performed. TREATMENT   Observation.  Increasing your fluid intake.  Extracorporeal shock wave lithotripsy--This is a noninvasive procedure that uses shock waves to break up kidney stones.  Surgery may be needed if you have severe pain or persistent obstruction. There are various surgical procedures. Most of the procedures are performed with the use of small instruments. Only small incisions are needed to accommodate these instruments, so recovery time is minimized. The size, location, and chemical composition are all important variables that will determine the proper choice of action for you. Talk to  your health care provider to better understand your situation so that you will minimize the risk of injury to yourself and your kidney.  HOME CARE INSTRUCTIONS   Drink enough water and fluids to keep your urine clear or pale yellow. This will help you to pass the stone or stone fragments.  Strain all urine through the provided strainer. Keep all particulate matter and stones for your health care provider to see. The stone causing the pain may be as small as a grain of salt. It is very important to use the strainer each and every time you pass your urine. The collection of your stone will allow your health care provider to analyze it and verify that a stone has actually passed. The stone analysis will often identify what you can do to reduce the incidence of recurrences.  Only take over-the-counter or prescription medicines for pain, discomfort, or fever as directed by your health care provider.  Make a follow-up appointment with your health care provider as directed.  Get follow-up X-rays if required. The absence of pain does not always mean that the stone has passed. It may have only stopped moving. If the urine remains completely obstructed, it can cause loss of kidney function or even complete destruction of the kidney. It is your responsibility to make sure X-rays and follow-ups are completed. Ultrasounds of the kidney can show blockages and the status   of the kidney. Ultrasounds are not associated with any radiation and can be performed easily in a matter of minutes. SEEK MEDICAL CARE IF:  You experience pain that is progressive and unresponsive to any pain medicine you have been prescribed. SEEK IMMEDIATE MEDICAL CARE IF:   Pain cannot be controlled with the prescribed medicine.  You have a fever or shaking chills.  The severity or intensity of pain increases over 18 hours and is not relieved by pain medicine.  You develop a new onset of abdominal pain.  You feel faint or pass  out.  You are unable to urinate. MAKE SURE YOU:   Understand these instructions.  Will watch your condition.  Will get help right away if you are not doing well or get worse. Document Released: 08/17/2005 Document Revised: 04/19/2013 Document Reviewed: 01/18/2013 ExitCare Patient Information 2015 [employer]. This information is not intended to replace advice given to you by your health care provider. Make sure you discuss any questions you have with your health care provider.  

## 2015-04-10 NOTE — Progress Notes (Signed)
    MRN: [id number] DOB: May 04, 1966  Subjective:   Juan Nelson is a 49 y.o. male presenting for follow up on Hypertension, STI testing and kidney stone.   HTN - Currently managed with lisinopril. Patient is checking blood pressure at home, range is 130's-140's systolic. Denies lightheadedness, dizziness, chronic headache, double vision, chest pain, shortness of breath, heart racing, palpitations, nausea, vomiting, abdominal pain, hematuria, lower leg swelling.   Stone - has history of kidney stones. Reports 4 day history of flank pain, significantly improved since this morning when he passed a stone which he collected and brought in.  STI - would like to have routine STI testing today. Plans on providing first catch urine sample and bring this at a later time.  Denies any other aggravating or relieving factors, no other questions or concerns.  Juan Nelson has a current medication list which includes the following prescription(s): aspirin ec, cholecalciferol, diclofenac, lisinopril, multivitamin with minerals, and tamsulosin. Also is allergic to pumpkin seed.  Juan Nelson  has a past medical history of Hypertension; Allergy; GERD (gastroesophageal reflux disease); Diabetes mellitus without complication; and Kidney stone. Also  has past surgical history that includes Spine surgery and Laparoscopic gastric banding.  Objective:   Vitals: BP 136/92 mmHg  Pulse 77  Temp(Src) 98.3 F (36.8 C) (Oral)  Resp 16  Ht  (1.956 m)  Wt 230 lb (104.327 kg)  BMI 27.27 kg/m2  SpO2 98%  BP Readings from Last 3 Encounters:  04/10/15 136/92  04/18/14 126/84  10/13/13 118/72   Physical Exam  Constitutional: He is oriented to person, place, and time. He appears well-developed and well-nourished.  HENT:  Mouth/Throat: Oropharynx is clear and moist.  Eyes: Pupils are equal, round, and reactive to light.  Neck: Normal range of motion. Neck supple. No thyromegaly present.  Cardiovascular: Normal rate,  regular rhythm and intact distal pulses.  Exam reveals no gallop and no friction rub.   No murmur heard. Pulmonary/Chest: No respiratory distress. He has no wheezes. He has no rales.  Abdominal: He exhibits no distension and no mass. There is no tenderness (no CVA tenderness).  Musculoskeletal: Normal range of motion. He exhibits no edema or tenderness.  Neurological: He is alert and oriented to person, place, and time.  Skin: Skin is warm and dry. No rash noted. No erythema. No pallor.   Assessment and Plan :   1. Essential hypertension - Will increase from  to  lisinopril. Continue healthy diet, exercise. Follow up in 6 months. - COMPLETE METABOLIC PANEL WITH GFR - lisinopril (PRINIVIL,ZESTRIL) 10 MG tablet; Take 1 tablet (10 mg total) by mouth daily.  Dispense: 90 tablet; Refill: 3  2. Routine screening for STI (sexually transmitted infection) - Labs pending - HIV antibody - RPR - GC/Chlamydia Probe Amp; Future  3. Kidney stone - Stone analysis pending  Wallis Bamberg, PA-C [facility] [phone] 04/10/2015 3:58 PM

## 2015-04-11 LAB — HIV ANTIBODY (ROUTINE TESTING W REFLEX): HIV 1&2 Ab, 4th Generation: NONREACTIVE

## 2015-04-11 LAB — RPR

## 2015-04-13 LAB — STONE ANALYSIS: STONE WEIGHT KSTONE: 0.037 g

## 2015-04-13 NOTE — Addendum Note (Signed)
Addended by: Bronson Curb on: 04/13/2015 11:56 AM   Modules accepted: Orders

## 2015-04-16 LAB — GC/CHLAMYDIA PROBE AMP
CT Probe RNA: NEGATIVE
GC Probe RNA: NEGATIVE

## 2015-07-02 ENCOUNTER — Other Ambulatory Visit: Payer: Self-pay

## 2015-07-02 MED ORDER — DICLOFENAC SODIUM 75 MG PO TBEC: DELAYED_RELEASE_TABLET | ORAL | Status: DC

## 2015-07-02 NOTE — Telephone Encounter (Signed)
That's fine. This is a progressive condition. I'll do a 30 day refill and please let patient know that he should plan to have f/u to make sure this isn't getting worse, needs a referral, etc. Thank you!

## 2015-07-02 NOTE — Telephone Encounter (Signed)
Pharm reqs RF of diclofenac tabs. 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 Kathlene NovemberMike, you recently saw pt for check up but don't see this discussed. Do you want to RF?

## 2015-07-05 NOTE — Telephone Encounter (Signed)
Advised pt on VM of need for f/up.

## 2015-07-29 ENCOUNTER — Encounter: Payer: Self-pay | Admitting: Internal Medicine

## 2015-10-11 ENCOUNTER — Ambulatory Visit (INDEPENDENT_AMBULATORY_CARE_PROVIDER_SITE_OTHER): Payer: BLUE CROSS/BLUE SHIELD | Admitting: Family Medicine

## 2015-10-11 VITALS — BP 126/84 | HR 77 | Temp 98.7°F | Resp 16 | Ht 77.0 in | Wt 224.0 lb

## 2015-10-11 DIAGNOSIS — J209 Acute bronchitis, unspecified: Secondary | ICD-10-CM | POA: Diagnosis not present

## 2015-10-11 MED ORDER — AZITHROMYCIN 250 MG PO TABS: ORAL_TABLET | ORAL | Status: DC

## 2015-10-11 NOTE — Progress Notes (Signed)
 @  By signing my name below, I, Raven Small, attest that this documentation has been prepared under the direction and in the presence of Elvina Sidle, MD.  Electronically Signed: Andrew Au, ED Scribe. 10/11/2015. 12:49 PM.  Patient ID: Juan Nelson MRN: [id number], DOB: 1966-01-10, 50 y.o. Date of Encounter: 10/11/2015, 12:49 PM  Primary Physician: Tonye Pearson, MD  Chief Complaint:  Chief Complaint  Patient presents with  . Medication Refill    diclofenac  . Nasal Congestion    began tuesday, feels congested in chest   . Cough    HPI: 50 y.o. year old male with history below presents with cough and nasal congestion that began 3 days ago. Pt reports symptoms started 4 days ago with hx. He reports associated chest congestion with cough, consisting of brown mucous. Cough does not keep him awake at night.   Pt is a Warden/ranger at [employer].   Past Medical History  Diagnosis Date  . Hypertension   . Allergy   . GERD (gastroesophageal reflux disease)   . Diabetes mellitus without complication (HCC)   . Kidney stone      Home Meds: Prior to Admission medications   Medication Sig Start Date End Date Taking? Authorizing Provider  aspirin EC 81 MG tablet Take 81 mg by mouth daily.   Yes Historical Provider, MD  cholecalciferol (VITAMIN D) 1000 UNITS tablet Take 1,000 Units by mouth daily.   Yes Historical Provider, MD  diclofenac (VOLTAREN) 75 MG EC tablet TAKE 1 TABLET (75 MG TOTAL) BY MOUTH 2 (TWO) TIMES DAILY AS NEEDED. 07/02/15  Yes Wallis Bamberg, PA-C  lisinopril (PRINIVIL,ZESTRIL) 10 MG tablet Take 1 tablet (10 mg total) by mouth daily. 04/10/15  Yes Wallis Bamberg, PA-C  Multiple Vitamin (MULTIVITAMIN WITH MINERALS) TABS Take 1 tablet by mouth daily.   Yes Historical Provider, MD  tamsulosin (FLOMAX) 0.4 MG CAPS capsule Take 1 tablet daily as needed for symptoms of kidney stone 04/18/14  Yes Deanna M Didiano, DO    Allergies:  Allergies  Allergen  Reactions  . Pumpkin Seed Hives and Swelling    Swelling of throat, tongue and lips.    Social History   Social History  . Marital Status: Single    Spouse Name: N/A  . Number of Children: N/A  . Years of Education: N/A   Occupational History  . Not on file.   Social History Main Topics  . Smoking status: Never Smoker   . Smokeless tobacco: Not on file  . Alcohol Use: No  . Drug Use: No  . Sexual Activity: Yes   Other Topics Concern  . Not on file   Social History Narrative     Review of Systems: Constitutional: negative for chills, fever, night sweats, weight changes, or fatigue  HEENT: negative for vision changes, hearing loss, congestion, rhinorrhea, ST, epistaxis, or sinus pressure Cardiovascular: negative for chest pain or palpitations Respiratory: negative for hemoptysis, wheezing, shortness of breath, or cough Abdominal: negative for abdominal pain, nausea, vomiting, diarrhea, or constipation Dermatological: negative for rash Neurologic: negative for headache, dizziness, or syncope All other systems reviewed and are otherwise negative with the exception to those above and in the HPI.   Physical Exam: Blood pressure 126/84, pulse 77, temperature 98.7 F (37.1 C), temperature source Oral, resp. rate 16, height  (1.956 m), weight 224 lb (101.606 kg), SpO2 98 %., Body mass index is 26.56 kg/(m^2). General: Well developed, well nourished, in no acute distress. Head:  Normocephalic, atraumatic, eyes without discharge, sclera non-icteric, nares are without discharge. Bilateral auditory canals clear, TM's are without perforation, pearly grey and translucent with reflective cone of light bilaterally. Oral cavity moist, posterior pharynx without exudate, erythema, peritonsillar abscess, or post nasal drip.  Neck: Supple. No thyromegaly. Full ROM. No lymphadenopathy. Lungs: Clear bilaterally to auscultation without wheezes. Breathing is unlabored.  Few bilateral rhonchi.   Heart: RRR with S1 S2. No murmurs, rubs, or gallops appreciated. Msk:  Strength and tone normal for age. Extremities/Skin: Warm and dry. No clubbing or cyanosis. No edema. No rashes or suspicious lesions. Neuro: Alert and oriented X 3. Moves all extremities spontaneously. Gait is normal. CNII-XII grossly in tact. Psych:  Responds to questions appropriately with a normal affect.    ASSESSMENT AND PLAN:  50 y.o. year old male with bronchitis This chart was scribed in my presence and reviewed by me personally.    ICD-9-CM ICD-10-CM   1. Acute bronchitis, unspecified organism 466.0 J20.9 azithromycin (ZITHROMAX) 250 MG tablet     Signed, Elvina Sidle, MD    Signed, Elvina Sidle, MD 10/11/2015 12:49 PM

## 2015-10-11 NOTE — Patient Instructions (Signed)
Great to see you again, Juan Nelson    Acute Bronchitis Bronchitis is inflammation of the airways that extend from the windpipe into the lungs (bronchi). The inflammation often causes mucus to develop. This leads to a cough, which is the most common symptom of bronchitis.  In acute bronchitis, the condition usually develops suddenly and goes away over time, usually in a couple weeks. Smoking, allergies, and asthma can make bronchitis worse. Repeated episodes of bronchitis may cause further lung problems.  CAUSES Acute bronchitis is most often caused by the same virus that causes a cold. The virus can spread from person to person (contagious) through coughing, sneezing, and touching contaminated objects. SIGNS AND SYMPTOMS   Cough.   Fever.   Coughing up mucus.   Body aches.   Chest congestion.   Chills.   Shortness of breath.   Sore throat.  DIAGNOSIS  Acute bronchitis is usually diagnosed through a physical exam. Your health care provider will also ask you questions about your medical history. Tests, such as chest X-rays, are sometimes done to rule out other conditions.  TREATMENT  Acute bronchitis usually goes away in a couple weeks. Oftentimes, no medical treatment is necessary. Medicines are sometimes given for relief of fever or cough. Antibiotic medicines are usually not needed but may be prescribed in certain situations. In some cases, an inhaler may be recommended to help reduce shortness of breath and control the cough. A cool mist vaporizer may also be used to help thin bronchial secretions and make it easier to clear the chest.  HOME CARE INSTRUCTIONS  Get plenty of rest.   Drink enough fluids to keep your urine clear or pale yellow (unless you have a medical condition that requires fluid restriction). Increasing fluids may help thin your respiratory secretions (sputum) and reduce chest congestion, and it will prevent dehydration.   Take medicines only as directed by  your health care provider.  If you were prescribed an antibiotic medicine, finish it all even if you start to feel better.  Avoid smoking and secondhand smoke. Exposure to cigarette smoke or irritating chemicals will make bronchitis worse. If you are a smoker, consider using nicotine gum or skin patches to help control withdrawal symptoms. Quitting smoking will help your lungs heal faster.   Reduce the chances of another bout of acute bronchitis by washing your hands frequently, avoiding people with cold symptoms, and trying not to touch your hands to your mouth, nose, or eyes.   Keep all follow-up visits as directed by your health care provider.  SEEK MEDICAL CARE IF: Your symptoms do not improve after 1 week of treatment.  SEEK IMMEDIATE MEDICAL CARE IF:  You develop an increased fever or chills.   You have chest pain.   You have severe shortness of breath.  You have bloody sputum.   You develop dehydration.  You faint or repeatedly feel like you are going to pass out.  You develop repeated vomiting.  You develop a severe headache. MAKE SURE YOU:   Understand these instructions.  Will watch your condition.  Will get help right away if you are not doing well or get worse.   This information is not intended to replace advice given to you by your health care provider. Make sure you discuss any questions you have with your health care provider.   Document Released: 09/24/2004 Document Revised: 09/07/2014 Document Reviewed: 02/07/2013 Else[employer] Interactive Patient Education [employer]

## 2016-01-24 ENCOUNTER — Ambulatory Visit (INDEPENDENT_AMBULATORY_CARE_PROVIDER_SITE_OTHER): Payer: BLUE CROSS/BLUE SHIELD

## 2016-01-24 ENCOUNTER — Ambulatory Visit (INDEPENDENT_AMBULATORY_CARE_PROVIDER_SITE_OTHER): Payer: BLUE CROSS/BLUE SHIELD | Admitting: Emergency Medicine

## 2016-01-24 VITALS — BP 128/50 | HR 75 | Temp 98.2°F | Resp 16 | Ht 77.0 in | Wt 228.2 lb

## 2016-01-24 DIAGNOSIS — Z125 Encounter for screening for malignant neoplasm of prostate: Secondary | ICD-10-CM

## 2016-01-24 DIAGNOSIS — Z87442 Personal history of urinary calculi: Secondary | ICD-10-CM

## 2016-01-24 DIAGNOSIS — I10 Essential (primary) hypertension: Secondary | ICD-10-CM | POA: Diagnosis not present

## 2016-01-24 DIAGNOSIS — Z1159 Encounter for screening for other viral diseases: Secondary | ICD-10-CM | POA: Diagnosis not present

## 2016-01-24 DIAGNOSIS — R350 Frequency of micturition: Secondary | ICD-10-CM | POA: Diagnosis not present

## 2016-01-24 LAB — POCT URINALYSIS DIP (MANUAL ENTRY)
Bilirubin, UA: NEGATIVE
Blood, UA: NEGATIVE
GLUCOSE UA: NEGATIVE
Ketones, POC UA: NEGATIVE
LEUKOCYTES UA: NEGATIVE
NITRITE UA: NEGATIVE
PROTEIN UA: NEGATIVE
Spec Grav, UA: 1.02
UROBILINOGEN UA: 0.2
pH, UA: 7

## 2016-01-24 LAB — POCT CBC
GRANULOCYTE PERCENT: 63.7 % (ref 37–80)
HEMATOCRIT: 38.1 % — AB (ref 43.5–53.7)
HEMOGLOBIN: 13.6 g/dL — AB (ref 14.1–18.1)
LYMPH, POC: 1.3 (ref 0.6–3.4)
MCH: 30.9 pg (ref 27–31.2)
MCHC: 35.8 g/dL — AB (ref 31.8–35.4)
MCV: 86.3 fL (ref 80–97)
MID (cbc): 0.5 (ref 0–0.9)
MPV: 7.9 fL (ref 0–99.8)
POC GRANULOCYTE: 3.2 (ref 2–6.9)
POC LYMPH PERCENT: 26.3 %L (ref 10–50)
POC MID %: 10 % (ref 0–12)
Platelet Count, POC: 139 10*3/uL — AB (ref 142–424)
RBC: 4.42 M/uL — AB (ref 4.69–6.13)
RDW, POC: 13.8 %
WBC: 5.1 10*3/uL (ref 4.6–10.2)

## 2016-01-24 LAB — COMPLETE METABOLIC PANEL WITH GFR
ALT: 32 U/L (ref 9–46)
AST: 25 U/L (ref 10–35)
Albumin: 4.6 g/dL (ref 3.6–5.1)
Alkaline Phosphatase: 81 U/L (ref 40–115)
BUN: 14 mg/dL (ref 7–25)
CHLORIDE: 104 mmol/L (ref 98–110)
CO2: 26 mmol/L (ref 20–31)
CREATININE: 1.05 mg/dL (ref 0.70–1.33)
Calcium: 9.2 mg/dL (ref 8.6–10.3)
GFR, Est African American: >89 mL/min (ref >=60)
GFR, Est Non African American: 82 mL/min (ref >=60)
Glucose, Bld: 80 mg/dL (ref 65–99)
Potassium: 4.4 mmol/L (ref 3.5–5.3)
SODIUM: 141 mmol/L (ref 135–146)
TOTAL PROTEIN: 6.7 g/dL (ref 6.1–8.1)
Total Bilirubin: 0.9 mg/dL (ref 0.2–1.2)

## 2016-01-24 LAB — POC MICROSCOPIC URINALYSIS (UMFC): Mucus: ABSENT

## 2016-01-24 LAB — GLUCOSE, POCT (MANUAL RESULT ENTRY): POC GLUCOSE: 71 mg/dL (ref 70–99)

## 2016-01-24 MED ORDER — LISINOPRIL 10 MG PO TABS: 10.0000 mg | ORAL_TABLET | Freq: Every day | ORAL | Status: DC

## 2016-01-24 MED ORDER — DICLOFENAC SODIUM 75 MG PO TBEC: DELAYED_RELEASE_TABLET | ORAL | Status: DC

## 2016-01-24 MED ORDER — TAMSULOSIN HCL 0.4 MG PO CAPS: ORAL_CAPSULE | ORAL | Status: DC

## 2016-01-24 NOTE — Patient Instructions (Addendum)
IF you received an x-ray today, you will receive an invoice from [facility]. Please contact [facility] at [phone] with questions or concerns regarding your invoice.   IF you received labwork today, you will receive an invoice from [employer]. Please contact [facility] at [phone] with questions or concerns regarding your invoice.   Our billing staff will not be able to assist you with questions regarding bills from these companies.  You will be contacted with the lab results as soon as they are available. The fastest way to get your results is to activate your My Chart account. Instructions are located on the last page of this paperwork. If you have not heard from [place] regarding the results in 2 weeks, please contact this office.     Kidney Stones Kidney stones (urolithiasis) are deposits that form inside your kidneys. The intense pain is caused by the stone moving through the urinary tract. When the stone moves, the ureter goes into spasm around the stone. The stone is usually passed in the urine.  CAUSES   A disorder that makes certain neck glands produce too much parathyroid hormone (primary hyperparathyroidism).  A buildup of uric acid crystals, similar to gout in your joints.  Narrowing (stricture) of the ureter.  A kidney obstruction present at birth (congenital obstruction).  Previous surgery on the kidney or ureters.  Numerous kidney infections. SYMPTOMS   Feeling sick to your stomach (nauseous).  Throwing up (vomiting).  Blood in the urine (hematuria).  Pain that usually spreads (radiates) to the groin.  Frequency or urgency of urination. DIAGNOSIS   Taking a history and physical exam.  Blood or urine tests.  CT scan.  Occasionally, an examination of the inside of the urinary bladder (cystoscopy) is performed. TREATMENT   Observation.  Increasing your fluid intake.  Extracorporeal shock wave  lithotripsy--This is a noninvasive procedure that uses shock waves to break up kidney stones.  Surgery may be needed if you have severe pain or persistent obstruction. There are various surgical procedures. Most of the procedures are performed with the use of small instruments. Only small incisions are needed to accommodate these instruments, so recovery time is minimized. The size, location, and chemical composition are all important variables that will determine the proper choice of action for you. Talk to your health care provider to better understand your situation so that you will minimize the risk of injury to yourself and your kidney.  HOME CARE INSTRUCTIONS   Drink enough water and fluids to keep your urine clear or pale yellow. This will help you to pass the stone or stone fragments.  Strain all urine through the provided strainer. Keep all particulate matter and stones for your health care provider to see. The stone causing the pain may be as small as a grain of salt. It is very important to use the strainer each and every time you pass your urine. The collection of your stone will allow your health care provider to analyze it and verify that a stone has actually passed. The stone analysis will often identify what you can do to reduce the incidence of recurrences.  Only take over-the-counter or prescription medicines for pain, discomfort, or fever as directed by your health care provider.  Keep all follow-up visits as told by your health care provider. This is important.  Get follow-up X-rays if required. The absence of pain does not always mean that the stone has passed. It may have only stopped  If the urine remains completely obstructed, it can cause loss of kidney function or even complete destruction of the kidney. It is your responsibility to make sure X-rays and follow-ups are completed. Ultrasounds of the kidney can show blockages and the status of the kidney. Ultrasounds are  not associated with any radiation and can be performed easily in a matter of minutes.  Make changes to your daily diet as told by your health care provider. You may be told to:  Limit the amount of salt that you eat.  Eat 5 or more servings of fruits and vegetables each day.  Limit the amount of meat, poultry, fish, and eggs that you eat.  Collect a 24-hour urine sample as told by your health care provider.You may need to collect another urine sample every 6-12 months. SEEK MEDICAL CARE IF:  You experience pain that is progressive and unresponsive to any pain medicine you have been prescribed. SEEK IMMEDIATE MEDICAL CARE IF:   Pain cannot be controlled with the prescribed medicine.  You have a fever or shaking chills.  The severity or intensity of pain increases over 18 hours and is not relieved by pain medicine.  You develop a new onset of abdominal pain.  You feel faint or pass out.  You are unable to urinate.   This information is not intended to replace advice given to you by your health care provider. Make sure you discuss any questions you have with your health care provider.   Document Released: 08/17/2005 Document Revised: 05/08/2015 Document Reviewed: 01/18/2013 Elsevier Interactive Patient Education 2016 [employer]vier Inc.   

## 2016-01-24 NOTE — Progress Notes (Addendum)
By signing my name below, I, Raven Small, attest that this documentation has been prepared under the direction and in the presence of Lesle Chris, MD.  Electronically Signed: Andrew Au, ED Scribe. 01/24/2016. 1:22 PM.  Chief Complaint:  Chief Complaint  Patient presents with  . Medication Refill    diclofenac sodium  . Flank Pain    pt has hx of kidney infections.    HPI: Juan Nelson is a 50 y.o. male who reports to [facility] today complaining of urinary frequency. He wakes up 2-3 times during the night to urinate. Pt believes symptoms are due to developing a kidney stone. He reports hx of kidney stones with a total of 8-9 stones in his life time. Pt usually takes flomax when having flank pain due to kidney stone but has not needed medication. He has never been seen by urology.  He denies having a stone removed in the past.   Pt has a lap band. Pt states he used to weight 400 lbs.   Pt would like a   Past Medical History  Diagnosis Date  . Hypertension   . Allergy   . GERD (gastroesophageal reflux disease)   . Diabetes mellitus without complication (HCC)   . Kidney stone    Past Surgical History  Procedure Laterality Date  . Spine surgery    . Laparoscopic gastric banding     Social History   Social History  . Marital Status: Single    Spouse Name: N/A  . Number of Children: N/A  . Years of Education: N/A   Social History Main Topics  . Smoking status: Never Smoker   . Smokeless tobacco: None  . Alcohol Use: No  . Drug Use: No  . Sexual Activity: Yes   Other Topics Concern  . None   Social History Narrative   Family History  Problem Relation Age of Onset  . Hypertension Mother   . Diabetes Father   . Cancer Maternal Grandmother   . Depression Maternal Grandfather   . Alzheimer's disease Paternal Grandmother   . Cancer Paternal Grandfather    Allergies  Allergen Reactions  . Pumpkin Seed Hives and Swelling    Swelling of throat, tongue and lips.     Prior to Admission medications   Medication Sig Start Date End Date Taking? Authorizing Provider  aspirin EC 81 MG tablet Take 81 mg by mouth daily.   Yes Historical Provider, MD  cholecalciferol (VITAMIN D) 1000 UNITS tablet Take 1,000 Units by mouth daily.   Yes Historical Provider, MD  diclofenac (VOLTAREN) 75 MG EC tablet TAKE 1 TABLET (75 MG TOTAL) BY MOUTH 2 (TWO) TIMES DAILY AS NEEDED. 07/02/15  Yes Wallis Bamberg, PA-C  lisinopril (PRINIVIL,ZESTRIL) 10 MG tablet Take 1 tablet (10 mg total) by mouth daily. 04/10/15  Yes Wallis Bamberg, PA-C  Multiple Vitamin (MULTIVITAMIN WITH MINERALS) TABS Take 1 tablet by mouth daily.   Yes Historical Provider, MD  tamsulosin (FLOMAX) 0.4 MG CAPS capsule Take 1 tablet daily as needed for symptoms of kidney stone 04/18/14  Yes Deanna M Didiano, DO  azithromycin (ZITHROMAX) 250 MG tablet Take 2 tabs PO x 1 dose, then 1 tab PO QD x 4 days Patient not taking: Reported on 01/24/2016 10/11/15   Elvina Sidle, MD     ROS: The patient denies fevers, chills, night sweats, unintentional weight loss, chest pain, palpitations, wheezing, dyspnea on exertion, nausea, vomiting, abdominal pain, dysuria, hematuria, melena, numbness, weakness, or tingling.  All other systems have been reviewed and were otherwise negative with the exception of those mentioned in the HPI and as above.    PHYSICAL EXAM: Filed Vitals:   01/24/16 1208  BP: 128/50  Pulse: 75  Temp: 98.2 F (36.8 C)  Resp: 16   Body mass index is 27.06 kg/(m^2).   General: Alert, no acute distress HEENT:  Normocephalic, atraumatic, oropharynx patent. Eye: Nonie HoyerOMI, [facility] Cardiovascular:  Regular rate and rhythm, no rubs murmurs or gallops.  No Carotid bruits, radial pulse intact. No pedal edema.  Respiratory: Clear to auscultation bilaterally.  No wheezes, rales, or rhonchi.  No cyanosis, no use of accessory musculature Abdominal: No organomegaly, abdomen is soft and non-tender, positive bowel sounds.  No  masses. Pt has had significant weight loss due to redundant skin present.  Musculoskeletal: Gait intact. No edema, tenderness Skin: No rashes. Neurologic: Facial musculature symmetric. Psychiatric: Patient acts appropriately throughout our interaction. Lymphatic: No cervical or submandibular lymphadenopathy   Results for orders placed or performed in visit on 01/24/16  POCT CBC  Result Value Ref Range   WBC 5.1 4.6 - 10.2 K/uL   Lymph, poc 1.3 0.6 - 3.4   POC LYMPH PERCENT 26.3 10 - 50 %L   MID (cbc) 0.5 0 - 0.9   POC MID % 10.0 0 - 12 %M   POC Granulocyte 3.2 2 - 6.9   Granulocyte percent 63.7 37 - 80 %G   RBC 4.42 (A) 4.69 - 6.13 M/uL   Hemoglobin 13.6 (A) 14.1 - 18.1 g/dL   HCT, POC 16.138.1 (A) 09.643.5 - 53.7 %   MCV 86.3 80 - 97 fL   MCH, POC 30.9 27 - 31.2 pg   MCHC 35.8 (A) 31.8 - 35.4 g/dL   RDW, POC 04.513.8 %   Platelet Count, POC 139 (A) 142 - 424 K/uL   MPV 7.9 0 - 99.8 fL  POCT glucose (manual entry)  Result Value Ref Range   POC Glucose 71 70 - 99 mg/dl  POCT Microscopic Urinalysis ([facility])  Result Value Ref Range   WBC,UR,HPF,POC None None WBC/hpf   RBC,UR,HPF,POC None None RBC/hpf   Bacteria None None, Too numerous to count   Mucus Absent Absent   Epithelial Cells, UR Per Microscopy None None, Too numerous to count cells/hpf  POCT urinalysis dipstick  Result Value Ref Range   Color, UA yellow yellow   Clarity, UA clear clear   Glucose, UA negative negative   Bilirubin, UA negative negative   Ketones, POC UA negative negative   Spec Grav, UA 1.020    Blood, UA negative negative   pH, UA 7.0    Protein Ur, POC negative negative   Urobilinogen, UA 0.2    Nitrite, UA Negative Negative   Leukocytes, UA Negative Negative   Dg Abd 1 View  01/24/2016  CLINICAL DATA:  History of kidney stones. EXAM: ABDOMEN - 1 VIEW COMPARISON:  CT 01/09/2010 FINDINGS: Bowel gas pattern is nonobstructive. Lap band apparatus is present and incompletely visualized. There is an 8 mm  calcification projected over the region of the proximal right ureter between the L2 and 3 seed transverse processes. This may represent a ureteral stone. There are moderate degenerate changes of the hips bone island over the left iliac bone. IMPRESSION: Nonobstructive bowel gas pattern. 8 mm calcification over the right abdomen in the expected region of the proximal ureter as cannot exclude a ureteral stone. Gastric lap band apparatus incompletely visualized. Electronically Signed   By:  Elberta Fortis M.D.   On: 01/24/2016 13:45     ASSESSMENT/PLAN: Patient does have an 8 mm  stone right side of the abdomen. I do think urology referral is indicated. I have placed that order.I personally performed the services described in this documentation, which was scribed in my presence. The recorded information has been reviewed and is accurate.   Gross sideeffects, risk and benefits, and alternatives of medications d/w patient. Patient is aware that all medications have potential sideeffects and we are unable to predict every sideeffect or drug-drug interaction that may occur.  Lesle Chris MD 01/24/2016 1:19 PM

## 2016-01-25 ENCOUNTER — Encounter: Payer: Self-pay | Admitting: *Deleted

## 2016-01-25 LAB — HEPATITIS C ANTIBODY: HCV Ab: NEGATIVE

## 2016-01-25 LAB — PSA: PSA: 0.66 ng/mL (ref <=4.00)

## 2016-03-24 DIAGNOSIS — D2261 Melanocytic nevi of right upper limb, including shoulder: Secondary | ICD-10-CM | POA: Diagnosis not present

## 2016-03-24 DIAGNOSIS — D224 Melanocytic nevi of scalp and neck: Secondary | ICD-10-CM | POA: Diagnosis not present

## 2016-03-24 DIAGNOSIS — D2210 Melanocytic nevi of unspecified eyelid, including canthus: Secondary | ICD-10-CM | POA: Diagnosis not present

## 2016-03-24 DIAGNOSIS — D2211 Melanocytic nevi of right eyelid, including canthus: Secondary | ICD-10-CM | POA: Diagnosis not present

## 2016-03-24 DIAGNOSIS — D225 Melanocytic nevi of trunk: Secondary | ICD-10-CM | POA: Diagnosis not present

## 2016-03-24 DIAGNOSIS — D2262 Melanocytic nevi of left upper limb, including shoulder: Secondary | ICD-10-CM | POA: Diagnosis not present

## 2016-03-30 ENCOUNTER — Other Ambulatory Visit: Payer: Self-pay | Admitting: Emergency Medicine

## 2016-05-07 DIAGNOSIS — N200 Calculus of kidney: Secondary | ICD-10-CM | POA: Diagnosis not present

## 2016-05-26 DIAGNOSIS — K760 Fatty (change of) liver, not elsewhere classified: Secondary | ICD-10-CM | POA: Diagnosis not present

## 2016-06-18 ENCOUNTER — Ambulatory Visit (INDEPENDENT_AMBULATORY_CARE_PROVIDER_SITE_OTHER): Payer: BLUE CROSS/BLUE SHIELD | Admitting: Family Medicine

## 2016-06-18 ENCOUNTER — Ambulatory Visit (INDEPENDENT_AMBULATORY_CARE_PROVIDER_SITE_OTHER): Payer: BLUE CROSS/BLUE SHIELD

## 2016-06-18 VITALS — BP 130/84 | HR 80 | Temp 98.2°F | Resp 17 | Ht 74.0 in | Wt 222.0 lb

## 2016-06-18 DIAGNOSIS — R300 Dysuria: Secondary | ICD-10-CM | POA: Diagnosis not present

## 2016-06-18 DIAGNOSIS — N200 Calculus of kidney: Secondary | ICD-10-CM | POA: Diagnosis not present

## 2016-06-18 DIAGNOSIS — N201 Calculus of ureter: Secondary | ICD-10-CM | POA: Diagnosis not present

## 2016-06-18 DIAGNOSIS — R109 Unspecified abdominal pain: Secondary | ICD-10-CM

## 2016-06-18 LAB — POC MICROSCOPIC URINALYSIS (UMFC): MUCUS RE: ABSENT

## 2016-06-18 LAB — POCT URINALYSIS DIP (MANUAL ENTRY)
Glucose, UA: NEGATIVE
Leukocytes, UA: NEGATIVE
NITRITE UA: NEGATIVE
PH UA: 5
Spec Grav, UA: 1.025
Urobilinogen, UA: 0.2

## 2016-06-18 MED ORDER — HYDROCODONE-ACETAMINOPHEN 5-325 MG PO TABS: 1.0000 | ORAL_TABLET | Freq: Four times a day (QID) | ORAL | 0 refills | Status: DC | PRN

## 2016-06-18 NOTE — Patient Instructions (Addendum)
You are scheduled to see Dr. Hollice EspyGibson today at [facility]  Your appointment is @ 145pm    IF you received an x-ray today, you will receive an invoice from [facility]. Please contact [facility] at [phone] with questions or concerns regarding your invoice.   IF you received labwork today, you will receive an invoice from [facility]. Please contact [facility] at [phone] with questions or concerns regarding your invoice.   Our billing staff will not be able to assist you with questions regarding bills from these companies.  You will be contacted with the lab results as soon as they are available. The fastest way to get your results is to activate your My Chart account. Instructions are located on the last page of this paperwork. If you have not heard from us regarding the results in 2 weeks, please contact this office.

## 2016-06-18 NOTE — Progress Notes (Signed)
Patient ID: Juan Nelson, male    DOB: 12/18/1965, 50 y.o.   MRN: [id number]  PCP: Tonye Pearson, MD  Chief Complaint  Patient presents with  . Nephrolithiasis    Pain started yesterday. hx of kidney stones.   . Medication Refill    voltaren    Subjective:   HPI 50 year old presents for evaluation of right flank pain times one day. He has a history of renal stones and is followed by [facility]. His last visit was in September in which a total of three stones were identified on ultrasound. He presents today with a complaint of radiating persistent right flank pain om severity that he almost went the emergency room last night. He reports sweating, right sided flank pain, he felt  feverish, and nausea.Reports recent onset of dark urine, denies gross blood. Dark urine, no gross blood.  At present he reports more of a dull aching right flank pain that radiates from his back toward the right upper abdomen.    Social History   Social History  . Marital status: Single    Spouse name: N/A  . Number of children: N/A  . Years of education: N/A   Occupational History  . Not on file.   Social History Main Topics  . Smoking status: Never Smoker  . Smokeless tobacco: Not on file  . Alcohol use No  . Drug use: No  . Sexual activity: Yes   Other Topics Concern  . Not on file   Social History Narrative  . No narrative on file   family history includes Alzheimer's disease in his paternal grandmother; Cancer in his maternal grandmother and paternal grandfather; Depression in his maternal grandfather; Diabetes in his father; Hypertension in his mother.   Review of Systems See HPI  Patient Active Problem List   Diagnosis Date Noted  . History of kidney stones 04/18/2014  . Essential hypertension, benign 04/18/2014  . Essential hypertension 04/18/2014  . LFT elevation 10/08/2012  . Hyperglycemia 10/08/2012     Prior to Admission medications   Medication Sig  Start Date End Date Taking? Authorizing Provider  aspirin EC 81 MG tablet Take 81 mg by mouth daily.   Yes Historical Provider, MD  cholecalciferol (VITAMIN D) 1000 UNITS tablet Take 1,000 Units by mouth daily.   Yes Historical Provider, MD  diclofenac (VOLTAREN) 75 MG EC tablet TAKE 1 TABLET (75 MG TOTAL) BY MOUTH 2 (TWO) TIMES DAILY AS NEEDED. 01/24/16  Yes Collene Gobble, MD  lisinopril (PRINIVIL,ZESTRIL) 10 MG tablet Take 1 tablet (10 mg total) by mouth daily. 01/24/16  Yes Collene Gobble, MD  Multiple Vitamin (MULTIVITAMIN WITH MINERALS) TABS Take 1 tablet by mouth daily.   Yes Historical Provider, MD  tamsulosin (FLOMAX) 0.4 MG CAPS capsule Take 1 tablet daily as needed for symptoms of kidney stone 01/24/16  Yes Collene Gobble, MD     Allergies  Allergen Reactions  . Pumpkin Seed Hives and Swelling    Swelling of throat, tongue and lips.   Vitals:   06/18/16 0848  BP: 130/84  Pulse: 80  Resp: 17  Temp: 98.2 F (36.8 C)      Objective:  Physical Exam  Constitutional: He is oriented to person, place, and time. He appears well-developed and well-nourished.  HENT:  Head: Normocephalic and atraumatic.  Right Ear: External ear normal.  Left Ear: External ear normal.  Nose: Nose normal.  Eyes: Conjunctivae are normal. Pupils are equal, round,  and reactive to light.  Neck: Normal range of motion. Neck supple.  Cardiovascular: Normal rate, regular rhythm, normal heart sounds and intact distal pulses.   Pulmonary/Chest: Effort normal and breath sounds normal.  Abdominal: Soft. Bowel sounds are normal. He exhibits no mass. There is tenderness in the suprapubic area. There is CVA tenderness.  Musculoskeletal: Normal range of motion.  Neurological: He is alert and oriented to person, place, and time.  Skin: Skin is warm and dry.  Psychiatric: He has a normal mood and affect. His behavior is normal. Judgment and thought content normal.      Dg Abd 1 View  Result Date:  06/18/2016 CLINICAL DATA:  Hx  of kidney stone. Rt side pain. EXAM: ABDOMEN - 1 VIEW COMPARISON:  05/07/2016 and previous FINDINGS: Normal bowel gas pattern. 9 mm calcification projects in the expected region of the right UPJ. 2 mm calcification projects over the mid right renal collecting system. 5 mm calcification projects over the left lower renal collecting system. Gastric lap band apparatus incompletely visualized. Stable bone island in the left iliac bone. Degenerative changes in bilateral hips right worse than left. IMPRESSION: 1. Bilateral nephrolithiasis with possible 9 mm right UPJ calculus. Electronically Signed   By: Corlis Leak  Hassell M.D.   On: 06/18/2016 10:34   Assessment & Plan:  1. Kidney stone - POCT Microscopic Urinalysis ([facility]) - POCT urinalysis dipstick - DG Abd 1 View; Future  2. Dysuria  50 year old male, presents with a complaint of kidney stones. He was seen and evaluated at [facility] and was later found to have a total of 3 kidney stones. Today he presents symptomatic, in acute pain related to fright sided flank pain with dysuria. KUB in office today shows bilateral stones with a possible Utereropelvic junction obstruction.  Plan: Pain management with Tramadol 50 mg every 8 hours as needed for pain. Emergent referral to [facility] today for further evaluation and treatment.   Godfrey PickKimberly S. Tiburcio PeaHarris, MSN, FNP-C [facility]

## 2016-06-25 DIAGNOSIS — N201 Calculus of ureter: Secondary | ICD-10-CM | POA: Diagnosis not present

## 2016-08-06 DIAGNOSIS — N200 Calculus of kidney: Secondary | ICD-10-CM | POA: Diagnosis not present

## 2016-10-06 DIAGNOSIS — N200 Calculus of kidney: Secondary | ICD-10-CM | POA: Diagnosis not present

## 2016-11-02 ENCOUNTER — Other Ambulatory Visit: Payer: Self-pay | Admitting: Urology

## 2016-11-02 DIAGNOSIS — N200 Calculus of kidney: Secondary | ICD-10-CM | POA: Diagnosis not present

## 2016-11-03 ENCOUNTER — Other Ambulatory Visit: Payer: Self-pay | Admitting: Urology

## 2016-11-03 ENCOUNTER — Encounter (HOSPITAL_COMMUNITY): Payer: Self-pay

## 2016-11-05 ENCOUNTER — Encounter (HOSPITAL_COMMUNITY): Payer: Self-pay | Admitting: *Deleted

## 2016-11-05 ENCOUNTER — Encounter (HOSPITAL_COMMUNITY)
Admission: RE | Disposition: A | Discharge status: Home / Self Care | Payer: Self-pay | Source: Ambulatory Visit | Attending: Urology

## 2016-11-05 ENCOUNTER — Ambulatory Visit (HOSPITAL_COMMUNITY)
Admission: RE | Admit: 2016-11-05 | Discharge: 2016-11-05 | Disposition: A | Discharge status: Home / Self Care | Payer: BLUE CROSS/BLUE SHIELD | Source: Ambulatory Visit | Attending: Urology | Admitting: Urology

## 2016-11-05 ENCOUNTER — Ambulatory Visit (HOSPITAL_COMMUNITY): Payer: BLUE CROSS/BLUE SHIELD

## 2016-11-05 DIAGNOSIS — I10 Essential (primary) hypertension: Secondary | ICD-10-CM | POA: Diagnosis not present

## 2016-11-05 DIAGNOSIS — Z9884 Bariatric surgery status: Secondary | ICD-10-CM | POA: Insufficient documentation

## 2016-11-05 DIAGNOSIS — N201 Calculus of ureter: Secondary | ICD-10-CM | POA: Diagnosis not present

## 2016-11-05 DIAGNOSIS — G4730 Sleep apnea, unspecified: Secondary | ICD-10-CM | POA: Insufficient documentation

## 2016-11-05 DIAGNOSIS — Z01818 Encounter for other preprocedural examination: Secondary | ICD-10-CM | POA: Diagnosis not present

## 2016-11-05 HISTORY — DX: Unspecified osteoarthritis, unspecified site: M19.90

## 2016-11-05 HISTORY — PX: EXTRACORPOREAL SHOCK WAVE LITHOTRIPSY: SHX1557

## 2016-11-05 HISTORY — DX: Sleep apnea, unspecified: G47.30

## 2016-11-05 HISTORY — DX: Personal history of urinary calculi: Z87.442

## 2016-11-05 SURGERY — LITHOTRIPSY, ESWL
Anesthesia: LOCAL | Laterality: Right

## 2016-11-05 MED ORDER — DIAZEPAM 5 MG PO TABS
10.0000 mg | ORAL_TABLET | ORAL | Status: AC
  Administered 2016-11-05: 10 mg via ORAL
  Filled 2016-11-05: qty 2

## 2016-11-05 MED ORDER — CIPROFLOXACIN HCL 500 MG PO TABS
500.0000 mg | ORAL_TABLET | ORAL | Status: AC
  Administered 2016-11-05: 500 mg via ORAL
  Filled 2016-11-05: qty 1

## 2016-11-05 MED ORDER — SODIUM CHLORIDE 0.9 % IV SOLN
INTRAVENOUS | Status: DC
  Administered 2016-11-05: 07:00:00 via INTRAVENOUS

## 2016-11-05 MED ORDER — DIPHENHYDRAMINE HCL 25 MG PO CAPS
25.0000 mg | ORAL_CAPSULE | ORAL | Status: AC
  Administered 2016-11-05: 25 mg via ORAL
  Filled 2016-11-05: qty 1

## 2016-11-05 NOTE — Discharge Instructions (Signed)
Lithotripsy, Care After °This sheet gives you information about how to care for yourself after your procedure. Your health care provider may also give you more specific instructions. If you have problems or questions, contact your health care provider. °What can I expect after the procedure? °After the procedure, it is common to have: °· Some blood in your urine. This should only last for a few days. °· Soreness in your back, sides, or upper abdomen for a few days. °· Blotches or bruises on your back where the pressure wave entered the skin. °· Pain, discomfort, or nausea when pieces (fragments) of the kidney stone move through the tube that carries urine from the kidney to the bladder (ureter). Stone fragments may pass soon after the procedure, but they may continue to pass for up to 4-8 weeks. °¨ If you have severe pain or nausea, contact your health care provider. This may be caused by a large stone that was not broken up, and this may mean that you need more treatment. °· Some pain or discomfort during urination. °· Some pain or discomfort in the lower abdomen or (in men) at the base of the penis. °Follow these instructions at home: °Medicines  °· Take over-the-counter and prescription medicines only as told by your health care provider. °· If you were prescribed an antibiotic medicine, take it as told by your health care provider. Do not stop taking the antibiotic even if you start to feel better. °· Do not drive for 24 hours if you were given a medicine to help you relax (sedative). °· Do not drive or use heavy machinery while taking prescription pain medicine. °Eating and drinking  °· Drink enough water and fluids to keep your urine clear or pale yellow. This helps any remaining pieces of the stone to pass. It can also help prevent new stones from forming. °· Eat plenty of fresh fruits and vegetables. °· Follow instructions from your health care provider about eating and drinking restrictions. You may be  instructed: °¨ To reduce how much salt (sodium) you eat or drink. Check ingredients and nutrition facts on packaged foods and beverages. °¨ To reduce how much meat you eat. °· Eat the recommended amount of calcium for your age and gender. Ask your health care provider how much calcium you should have. °General instructions  °· Get plenty of rest. °· Most people can resume normal activities 1-2 days after the procedure. Ask your health care provider what activities are safe for you. °· If directed, strain all urine through the strainer that was provided by your health care provider. °¨ Keep all fragments for your health care provider to see. Any stones that are found may be sent to a medical lab for examination. The stone may be as small as a grain of salt. °· Keep all follow-up visits as told by your health care provider. This is important. °Contact a health care provider if: °· You have pain that is severe or does not get better with medicine. °· You have nausea that is severe or does not go away. °· You have blood in your urine longer than your health care provider told you to expect. °· You have more blood in your urine. °· You have pain during urination that does not go away. °· You urinate more frequently than usual and this does not go away. °· You develop a rash or any other possible signs of an allergic reaction. °Get help right away if: °· You have severe   pain in your back, sides, or upper abdomen. °· You have severe pain while urinating. °· Your urine is very dark red. °· You have blood in your stool (feces). °· You cannot pass any urine at all. °· You feel a strong urge to urinate after emptying your bladder. °· You have a fever or chills. °· You develop shortness of breath, difficulty breathing, or chest pain. °· You have severe nausea that leads to persistent vomiting. °· You faint. °Summary °· After this procedure, it is common to have some pain, discomfort, or nausea when pieces (fragments) of the  kidney stone move through the tube that carries urine from the kidney to the bladder (ureter). If this pain or nausea is severe, however, you should contact your health care provider. °· Most people can resume normal activities 1-2 days after the procedure. Ask your health care provider what activities are safe for you. °· Drink enough water and fluids to keep your urine clear or pale yellow. This helps any remaining pieces of the stone to pass, and it can help prevent new stones from forming. °· If directed, strain your urine and keep all fragments for your health care provider to see. Fragments or stones may be as small as a grain of salt. °· Get help right away if you have severe pain in your back, sides, or upper abdomen or have severe pain while urinating. °This information is not intended to replace advice given to you by your health care provider. Make sure you discuss any questions you have with your health care provider. °Document Released: 09/06/2007 Document Revised: 07/08/2016 Document Reviewed: 07/08/2016 °Elsevier Interactive Patient Education © 2017 [employer] ° ° °Moderate Conscious Sedation, Adult, Care After °These instructions provide you with information about caring for yourself after your procedure. Your health care provider may also give you more specific instructions. Your treatment has been planned according to current medical practices, but problems sometimes occur. Call your health care provider if you have any problems or questions after your procedure. °What can I expect after the procedure? °After your procedure, it is common: °· To feel sleepy for several hours. °· To feel clumsy and have poor balance for several hours. °· To have poor judgment for several hours. °· To vomit if you eat too soon. °Follow these instructions at home: °For at least 24 hours after the procedure:  ° °· Do not: °¨ Participate in activities where you could fall or become injured. °¨ Drive. °¨ Use heavy  machinery. °¨ Drink alcohol. °¨ Take sleeping pills or medicines that cause drowsiness. °¨ Make important decisions or sign legal documents. °¨ Take care of children on your own. °· Rest. °Eating and drinking  °· Follow the diet recommended by your health care provider. °· If you vomit: °¨ Drink water, juice, or soup when you can drink without vomiting. °¨ Make sure you have little or no nausea before eating solid foods. °General instructions  °· Have a responsible adult stay with you until you are awake and alert. °· Take over-the-counter and prescription medicines only as told by your health care provider. °· If you smoke, do not smoke without supervision. °· Keep all follow-up visits as told by your health care provider. This is important. °Contact a health care provider if: °· You keep feeling nauseous or you keep vomiting. °· You feel light-headed. °· You develop a rash. °· You have a fever. °Get help right away if: °· You have trouble breathing. °This information   is not intended to replace advice given to you by your health care provider. Make sure you discuss any questions you have with your health care provider. °Document Released: 06/07/2013 Document Revised: 01/20/2016 Document Reviewed: 12/07/2015 °Elsevier Interactive Patient Education © 2017 [employer] ° ° °

## 2016-11-12 DIAGNOSIS — N200 Calculus of kidney: Secondary | ICD-10-CM | POA: Diagnosis not present

## 2016-11-15 ENCOUNTER — Emergency Department (HOSPITAL_COMMUNITY): Payer: BLUE CROSS/BLUE SHIELD

## 2016-11-15 ENCOUNTER — Encounter (HOSPITAL_COMMUNITY): Payer: Self-pay

## 2016-11-15 ENCOUNTER — Emergency Department (HOSPITAL_COMMUNITY)
Admission: EM | Admit: 2016-11-15 | Discharge: 2016-11-15 | Disposition: A | Discharge status: Home / Self Care | Payer: BLUE CROSS/BLUE SHIELD | Attending: Emergency Medicine | Admitting: Emergency Medicine

## 2016-11-15 DIAGNOSIS — D649 Anemia, unspecified: Secondary | ICD-10-CM | POA: Diagnosis not present

## 2016-11-15 DIAGNOSIS — N201 Calculus of ureter: Secondary | ICD-10-CM | POA: Insufficient documentation

## 2016-11-15 DIAGNOSIS — R109 Unspecified abdominal pain: Secondary | ICD-10-CM | POA: Diagnosis not present

## 2016-11-15 DIAGNOSIS — I10 Essential (primary) hypertension: Secondary | ICD-10-CM | POA: Insufficient documentation

## 2016-11-15 DIAGNOSIS — Z7982 Long term (current) use of aspirin: Secondary | ICD-10-CM | POA: Diagnosis not present

## 2016-11-15 DIAGNOSIS — Z79899 Other long term (current) drug therapy: Secondary | ICD-10-CM | POA: Insufficient documentation

## 2016-11-15 LAB — CBC WITH DIFFERENTIAL/PLATELET
BASOS ABS: 0 10*3/uL (ref 0.0–0.1)
BASOS PCT: 0 %
EOS ABS: 0 10*3/uL (ref 0.0–0.7)
Eosinophils Relative: 1 %
HEMATOCRIT: 29.4 % — AB (ref 39.0–52.0)
Hemoglobin: 10.2 g/dL — ABNORMAL LOW (ref 13.0–17.0)
Lymphocytes Relative: 8 %
Lymphs Abs: 0.4 10*3/uL — ABNORMAL LOW (ref 0.7–4.0)
MCH: 30 pg (ref 26.0–34.0)
MCHC: 34.7 g/dL (ref 30.0–36.0)
MCV: 86.5 fL (ref 78.0–100.0)
MONO ABS: 0.2 10*3/uL (ref 0.1–1.0)
MONOS PCT: 4 %
Neutro Abs: 4.5 10*3/uL (ref 1.7–7.7)
Neutrophils Relative %: 87 %
Platelets: 186 10*3/uL (ref 150–400)
RBC: 3.4 MIL/uL — ABNORMAL LOW (ref 4.22–5.81)
RDW: 13.4 % (ref 11.5–15.5)
WBC: 5.2 10*3/uL (ref 4.0–10.5)

## 2016-11-15 LAB — URINALYSIS, ROUTINE W REFLEX MICROSCOPIC
BACTERIA UA: NONE SEEN
BILIRUBIN URINE: NEGATIVE
GLUCOSE, UA: NEGATIVE mg/dL
Ketones, ur: NEGATIVE mg/dL
LEUKOCYTES UA: NEGATIVE
NITRITE: NEGATIVE
PH: 5 (ref 5.0–8.0)
Protein, ur: 30 mg/dL — AB
SPECIFIC GRAVITY, URINE: 1.019 (ref 1.005–1.030)
Squamous Epithelial / HPF: NONE SEEN
WBC, UA: NONE SEEN WBC/hpf (ref 0–5)

## 2016-11-15 LAB — BASIC METABOLIC PANEL
Anion gap: 9 (ref 5–15)
BUN: 18 mg/dL (ref 6–20)
CHLORIDE: 106 mmol/L (ref 101–111)
CO2: 26 mmol/L (ref 22–32)
CREATININE: 1.41 mg/dL — AB (ref 0.61–1.24)
Calcium: 9.2 mg/dL (ref 8.9–10.3)
GFR calc Af Amer: >60 mL/min (ref >60)
GFR calc non Af Amer: 56 mL/min — ABNORMAL LOW (ref >60)
Glucose, Bld: 115 mg/dL — ABNORMAL HIGH (ref 65–99)
Potassium: 3.6 mmol/L (ref 3.5–5.1)
SODIUM: 141 mmol/L (ref 135–145)

## 2016-11-15 MED ORDER — ONDANSETRON HCL 4 MG/2ML IJ SOLN
4.0000 mg | Freq: Once | INTRAMUSCULAR | Status: AC
  Administered 2016-11-15: 4 mg via INTRAVENOUS
  Filled 2016-11-15: qty 2

## 2016-11-15 MED ORDER — OXYCODONE-ACETAMINOPHEN 5-325 MG PO TABS: 1.0000 | ORAL_TABLET | ORAL | 0 refills | Status: DC | PRN

## 2016-11-15 MED ORDER — SODIUM CHLORIDE 0.9 % IV BOLUS (SEPSIS)
1000.0000 mL | Freq: Once | INTRAVENOUS | Status: AC
  Administered 2016-11-15: 1000 mL via INTRAVENOUS

## 2016-11-15 MED ORDER — KETOROLAC TROMETHAMINE 30 MG/ML IJ SOLN
30.0000 mg | Freq: Once | INTRAMUSCULAR | Status: AC
  Administered 2016-11-15: 30 mg via INTRAVENOUS
  Filled 2016-11-15: qty 1

## 2016-11-15 MED ORDER — TAMSULOSIN HCL 0.4 MG PO CAPS: ORAL_CAPSULE | ORAL | 1 refills | Status: DC

## 2016-11-15 MED ORDER — ONDANSETRON HCL 4 MG PO TABS: 4.0000 mg | ORAL_TABLET | Freq: Four times a day (QID) | ORAL | 0 refills | Status: DC | PRN

## 2016-11-15 MED ORDER — MORPHINE SULFATE (PF) 4 MG/ML IV SOLN
4.0000 mg | Freq: Once | INTRAVENOUS | Status: AC
  Administered 2016-11-15: 4 mg via INTRAVENOUS
  Filled 2016-11-15: qty 1

## 2016-11-15 NOTE — ED Provider Notes (Signed)
[facility]-EMERGENCY DEPT Provider Note   CSN: [id number] Arrival date & time: 11/15/16  [id number]     History   Chief Complaint Chief Complaint  Patient presents with  . Flank Pain    HPI Juan Nelson is a 51 y.o. male.  He has a history of kidney stones and is 10 days post lithotripsy of stone on the right side. Yesterday morning, he had left flank pain which resolved. 2:30 AM, he had recurrence of left flank pain with associated nausea and vomiting. Pain is typical of what he has had with kidney stones, but he has never vomited before. He rates pain at 9/10. He denies fever, chills, sweats. He has not taken anything for his pain.    Flank Pain     Past Medical History:  Diagnosis Date  . Allergy   . Arthritis    right knee  . GERD (gastroesophageal reflux disease)   . History of kidney stones    multople stones   . Hypertension   . Kidney stone   . Sleep apnea     Patient Active Problem List   Diagnosis Date Noted  . History of kidney stones 04/18/2014  . Essential hypertension, benign 04/18/2014  . Essential hypertension 04/18/2014  . LFT elevation 10/08/2012  . Hyperglycemia 10/08/2012    Past Surgical History:  Procedure Laterality Date  . GANGLION CYST EXCISION  1989  . LAPAROSCOPIC GASTRIC BANDING    . SPINE SURGERY         Home Medications    Prior to Admission medications   Medication Sig Start Date End Date Taking? Authorizing Provider  aspirin EC 81 MG tablet Take 81 mg by mouth daily.    Historical Provider, MD  cholecalciferol (VITAMIN D) 1000 UNITS tablet Take 1,000 Units by mouth daily.    Historical Provider, MD  diclofenac (VOLTAREN) 75 MG EC tablet TAKE 1 TABLET (75 MG TOTAL) BY MOUTH 2 (TWO) TIMES DAILY AS NEEDED. 01/24/16   Collene GobbleSteven A Daub, MD  HYDROcodone-acetaminophen (NORCO) 5-325 MG tablet Take 1-2 tablets by mouth every 6 (six) hours as needed. 06/18/16   Doyle AskewKimberly Stephenia Harris, FNP  lisinopril (PRINIVIL,ZESTRIL) 10 MG tablet Take 1  tablet (10 mg total) by mouth daily. 01/24/16   Collene GobbleSteven A Daub, MD  Multiple Vitamin (MULTIVITAMIN WITH MINERALS) TABS Take 1 tablet by mouth daily.    Historical Provider, MD  tamsulosin (FLOMAX) 0.4 MG CAPS capsule Take 1 tablet daily as needed for symptoms of kidney stone 01/24/16   Collene GobbleSteven A Daub, MD    Family History Family History  Problem Relation Age of Onset  . Hypertension Mother   . Diabetes Father   . Cancer Maternal Grandmother   . Depression Maternal Grandfather   . Alzheimer's disease Paternal Grandmother   . Cancer Paternal Grandfather     Social History Social History  Substance Use Topics  . Smoking status: Never Smoker  . Smokeless tobacco: Never Used  . Alcohol use No     Allergies   Pumpkin seed   Review of Systems Review of Systems  Genitourinary: Positive for flank pain.  All other systems reviewed and are negative.    Physical Exam Updated Vital Signs BP (!) 150/88 (BP Location: Left Arm)   Pulse 80   Temp 98.3 F (36.8 C) (Oral)   Resp 20   Ht 6\' 2"  (1.88 m)   Wt 210 lb (95.3 kg)   SpO2 100%   BMI 26.96 kg/m   Physical Exam  Nursing note and vitals reviewed.  51 year old male, Who appears uncomfortable, but is in no acute distress. Vital signs are significant for systolic hypertension. Oxygen saturation is 100%, which is normal. Head is normocephalic and atraumatic. PERRLA, EOMI. Oropharynx is clear. Neck is nontender and supple without adenopathy or JVD. Back is nontender in the midline. There is mild left CVA tenderness. Lungs are clear without rales, wheezes, or rhonchi. Chest is nontender. Heart has regular rate and rhythm without murmur. Abdomen is soft, flat, nontender without masses or hepatosplenomegaly and peristalsis is hypoactive. Extremities have no cyanosis or edema, full range of motion is present. Skin is pale, warm, and dry without rash. Neurologic: Mental status is normal, cranial nerves are intact, there are no motor  or sensory deficits.  ED Treatments / Results  Labs (all labs ordered are listed, but only abnormal results are displayed) Labs Reviewed  BASIC METABOLIC PANEL - Abnormal; Notable for the following:       Result Value   Glucose, Bld 115 (*)    Creatinine, Ser 1.41 (*)    GFR calc non Af Amer 56 (*)    All other components within normal limits  CBC WITH DIFFERENTIAL/PLATELET - Abnormal; Notable for the following:    RBC 3.40 (*)    Hemoglobin 10.2 (*)    HCT 29.4 (*)    Lymphs Abs 0.4 (*)    All other components within normal limits  URINALYSIS, ROUTINE W REFLEX MICROSCOPIC - Abnormal; Notable for the following:    APPearance HAZY (*)    Hgb urine dipstick LARGE (*)    Protein, ur 30 (*)    All other components within normal limits    Radiology Ct Renal Stone Study  Result Date: 11/15/2016 CLINICAL DATA:  Left flank pain beginning 6 hours ago. Hematuria. History of stone disease with lithotripsy. EXAM: CT ABDOMEN AND PELVIS WITHOUT CONTRAST TECHNIQUE: Multidetector CT imaging of the abdomen and pelvis was performed following the standard protocol without IV contrast. COMPARISON:  Radiography 11/05/2016.  CT 01/09/2010. FINDINGS: Lower chest: Lung bases are clear. No pleural or pericardial fluid. Coronary artery calcification is noted. Hepatobiliary: Liver parenchyma is normal.  No calcified gallstones. Pancreas: Pancreas is normal. Spleen: Normal Adrenals/Urinary Tract: Left adrenal gland is normal. Right adrenal gland is normal. Left kidney contains several small nonobstructing stones, the largest measuring 4 mm in the lower pole. There is a 3 mm stone in the proximal ureter on the left at the level of L3-4. No stones seen below that. No stone in the bladder. On the right, the kidney is markedly swollen and the anatomy is disrupted. This is consistent with intrarenal hemorrhage as well as hemorrhage in the perirenal space. There is a 1 mm stone in the right kidney. No stones seen in the  right ureter. Stomach/Bowel: Previous lap band. No acute or significant bowel finding. Vascular/Lymphatic: Aortic atherosclerosis. No aneurysm. IVC is normal. No retroperitoneal adenopathy. Reproductive: Normal Other: No free fluid or air. Musculoskeletal: Ordinary lumbar degenerative changes. IMPRESSION: 3 mm stone in the left proximal ureter at the level of L3-4. Mild fullness of the left renal collecting system. Several other small nonobstructing left renal calculi, the largest measuring 4 mm in the lower pole. Recent lithotripsy on the right. Renal parenchymal hematoma and perirenal hematoma. Tiny residual right renal calculus. No sign of renal obstruction on the right. Electronically Signed   By: Paulina Fusi M.D.   On: 11/15/2016 07:44    Procedures Procedures (including critical  care time)  Medications Ordered in ED Medications  ondansetron (ZOFRAN) injection 4 mg (not administered)  ketorolac (TORADOL) 30 MG/ML injection 30 mg (not administered)  morphine 4 MG/ML injection 4 mg (not administered)  sodium chloride 0.9 % bolus 1,000 mL (not administered)     Initial Impression / Assessment and Plan / ED Course  I have reviewed the triage vital signs and the nursing notes.  Pertinent labs & imaging results that were available during my care of the patient were reviewed by me and considered in my medical decision making (see chart for details).  Left flank pain consistent with ureteral colic. Old records reviewed confirming recent lithotripsy for right ureteral calculus. He will be sent for renal stone protocol CT scan. He'll be given IV fluids, morphine, ketorolac, ondansetron.  He had good relief of pain with above noted treatment. CT shows evidence of right renal hematoma from recent lithotripsy. There is a 3 mm mid ureteral calculus on the left with hydronephrosis. He already has follow-up arranged with urology because of his lithotripsy. He is discharged with prescriptions for  oxycodone have acetaminophen, ondansetron, and tamsulosin.  Laboratory workup does show anemia which has worsened. This is presumably secondary to blood loss related to his lithotripsy and perinephric hematoma.  Final Clinical Impressions(s) / ED Diagnoses   Final diagnoses:  Left flank pain  Ureterolithiasis  Normochromic normocytic anemia    New Prescriptions New Prescriptions   No medications on file     Dione Booze, MD 11/15/16 [phone]

## 2016-11-15 NOTE — ED Triage Notes (Signed)
States pain in left flank hx of kidney stones with hematuria voiced with nausea and vomiting voiced.

## 2016-11-17 DIAGNOSIS — N200 Calculus of kidney: Secondary | ICD-10-CM | POA: Diagnosis not present

## 2016-11-19 ENCOUNTER — Encounter (HOSPITAL_COMMUNITY): Payer: Self-pay | Admitting: Urology

## 2016-12-01 ENCOUNTER — Ambulatory Visit (INDEPENDENT_AMBULATORY_CARE_PROVIDER_SITE_OTHER): Payer: BLUE CROSS/BLUE SHIELD | Admitting: Family Medicine

## 2016-12-01 VITALS — BP 134/80 | HR 78 | Temp 98.0°F | Resp 17 | Ht 72.5 in | Wt 206.0 lb

## 2016-12-01 DIAGNOSIS — I10 Essential (primary) hypertension: Secondary | ICD-10-CM | POA: Diagnosis not present

## 2016-12-01 DIAGNOSIS — Z13220 Encounter for screening for lipoid disorders: Secondary | ICD-10-CM

## 2016-12-01 DIAGNOSIS — D649 Anemia, unspecified: Secondary | ICD-10-CM | POA: Diagnosis not present

## 2016-12-01 DIAGNOSIS — R739 Hyperglycemia, unspecified: Secondary | ICD-10-CM

## 2016-12-01 DIAGNOSIS — R7989 Other specified abnormal findings of blood chemistry: Secondary | ICD-10-CM

## 2016-12-01 DIAGNOSIS — N202 Calculus of kidney with calculus of ureter: Secondary | ICD-10-CM | POA: Diagnosis not present

## 2016-12-01 MED ORDER — LISINOPRIL 10 MG PO TABS: 10.0000 mg | ORAL_TABLET | Freq: Every day | ORAL | 1 refills | Status: DC

## 2016-12-01 NOTE — Patient Instructions (Addendum)
No change in blood pressure medications today. I will check cholesterol, kidney, liver tests, three-month diabetes test, blood counts. I suspect your kidney function will be better now than what it was a few weeks ago. I will also check your blood count as that was slightly low in the ER. I suspect that will also be improved. Follow-up within 6 months for a physical, sooner if you have new concerns or questions.    IF you received an x-ray today, you will receive an invoice from [facility]. Please contact [facility] at [phone] with questions or concerns regarding your invoice.   IF you received labwork today, you will receive an invoice from [place]. Please contact [facility] at [phone] with questions or concerns regarding your invoice.   Our billing staff will not be able to assist you with questions regarding bills from these companies.  You will be contacted with the lab results as soon as they are available. The fastest way to get your results is to activate your My Chart account. Instructions are located on the last page of this paperwork. If you have not heard from [place] regarding the results in 2 weeks, please contact this office.

## 2016-12-01 NOTE — Progress Notes (Signed)
Subjective:  By signing my name below, I, [name], attest that this documentation has been prepared under the direction and in the presence of Shade Flood, MD Electronically Signed: Charline Bills, ED Scribe 12/01/2016 at 11:58 AM.   Patient ID: Juan Nelson, male    DOB: 1966/01/16, 51 y.o.   MRN: [id number]  Chief Complaint  Patient presents with  . Follow-up    patient wants blood work per patient    HPI Juan Nelson is a 51 y.o. male who presents to Primary Care at [facility] for follow-up and lab work. Pt was last seen by me August 2015. He saw Jerrilyn Cairo oct 2017 for kid stones. H/o HTN, hyperglycemia and elevated LFTs. Pt is fasting at this visit.   HTN Lab Results  Component Value Date   CREATININE 1.41 (H) 11/15/2016  Previous range around 1.05. Most recent draw at time of kidney stone. Pt takes lisinopril 10 mg qd and aspirin. He denies any side effects.    Kidney Stones Last seen in ER 11/15/16. He is followed by urology including prior lithotripsy. Of note, his anemia has worsened at ER visit but thought to be related to lithotripsy and perinephric hematoma. Pt states that he was able to pass a kidney stone, however, he has an upcoming appointment on 4/16 with a nephrologist. Pt states that he passed different materials other than kidney stones.   H/o Elevated LFTs No results found for: CHOL, HDL, LDLCALC, LDLDIRECT, TRIG, CHOLHDL 05/26/16: total was 161, trig 105, LDL 109, HDL 31.   Lab Results  Component Value Date   ALT 32 01/24/2016   AST 25 01/24/2016   ALKPHOS 81 01/24/2016   BILITOT 0.9 01/24/2016   Lab Results  Component Value Date   WBC 5.2 11/15/2016   HGB 10.2 (L) 11/15/2016   HCT 29.4 (L) 11/15/2016   MCV 86.5 11/15/2016   PLT 186 11/15/2016  Pt reports ~200 lb weight loss; states he used to weigh close to 400 lbs. He has changed his diet and is exercising 5 days/week. Pt denies chest pain, chest tightness or sob with exercising.    Hyperglycemia Normal over the past year but elevated at ER visit a few weeks ago at 115.    Patient Active Problem List   Diagnosis Date Noted  . History of kidney stones 04/18/2014  . Essential hypertension, benign 04/18/2014  . Essential hypertension 04/18/2014  . LFT elevation 10/08/2012  . Hyperglycemia 10/08/2012   Past Medical History:  Diagnosis Date  . Allergy   . Arthritis    right knee  . GERD (gastroesophageal reflux disease)   . History of kidney stones    multople stones   . Hypertension   . Kidney stone   . Sleep apnea    Past Surgical History:  Procedure Laterality Date  . EXTRACORPOREAL SHOCK WAVE LITHOTRIPSY Right 11/05/2016   Procedure: RIGHT EXTRACORPOREAL SHOCK WAVE LITHOTRIPSY (ESWL);  Surgeon: Malen Gauze, MD;  Location: [facility];  Service: Urology;  Laterality: Right;  . GANGLION CYST EXCISION  1989  . LAPAROSCOPIC GASTRIC BANDING    . SPINE SURGERY     Allergies  Allergen Reactions  . Pumpkin Seed Hives and Swelling    Swelling of throat, tongue and lips.   Prior to Admission medications   Medication Sig Start Date End Date Taking? Authorizing Provider  aspirin EC 81 MG tablet Take 81 mg by mouth daily.   Yes Historical Provider, MD  cholecalciferol (VITAMIN  D) 1000 UNITS tablet Take 1,000 Units by mouth daily.   Yes Historical Provider, MD  diclofenac (VOLTAREN) 75 MG EC tablet TAKE 1 TABLET (75 MG TOTAL) BY MOUTH 2 (TWO) TIMES DAILY AS NEEDED. Patient taking differently: Take 75 mg by mouth 2 (two) times daily as needed for mild pain or moderate pain.  01/24/16  Yes Collene Gobble, MD  HYDROcodone-acetaminophen (NORCO) 5-325 MG tablet Take 1-2 tablets by mouth every 6 (six) hours as needed. 06/18/16  Yes Doyle Askew, FNP  lisinopril (PRINIVIL,ZESTRIL) 10 MG tablet Take 1 tablet (10 mg total) by mouth daily. 01/24/16  Yes Collene Gobble, MD  Multiple Vitamin (MULTIVITAMIN WITH MINERALS) TABS Take 1 tablet by mouth daily.   Yes  Historical Provider, MD  ondansetron (ZOFRAN) 4 MG tablet Take 1 tablet (4 mg total) by mouth every 6 (six) hours as needed for nausea or vomiting. 11/15/16  Yes Dione Booze, MD  oxyCODONE-acetaminophen (PERCOCET) 5-325 MG tablet Take 1 tablet by mouth every 4 (four) hours as needed for moderate pain. 11/15/16  Yes Dione Booze, MD  tamsulosin [facility]) 0.4 MG CAPS capsule Take 1 tablet daily as needed for symptoms of kidney stone 11/15/16  Yes Dione Booze, MD   Social History   Social History  . Marital status: Single    Spouse name: N/A  . Number of children: N/A  . Years of education: N/A   Occupational History  . Not on file.   Social History Main Topics  . Smoking status: Never Smoker  . Smokeless tobacco: Never Used  . Alcohol use No  . Drug use: No  . Sexual activity: Yes   Other Topics Concern  . Not on file   Social History Narrative  . No narrative on file   Review of Systems  Constitutional: Negative for fatigue and unexpected weight change.  Eyes: Negative for visual disturbance.  Respiratory: Negative for cough, chest tightness and shortness of breath.   Cardiovascular: Negative for chest pain, palpitations and leg swelling.  Gastrointestinal: Negative for abdominal pain and blood in stool.  Neurological: Negative for dizziness, light-headedness and headaches.      Objective:   Physical Exam  Constitutional: He is oriented to person, place, and time. He appears well-developed and well-nourished.  HENT:  Head: Normocephalic and atraumatic.  Eyes: EOM are normal. Pupils are equal, round, and reactive to light.  Neck: No JVD present. Carotid bruit is not present.  Cardiovascular: Normal rate, regular rhythm and normal heart sounds.   No murmur heard. Pulmonary/Chest: Effort normal and breath sounds normal. He has no rales.  Musculoskeletal: He exhibits no edema.  Neurological: He is alert and oriented to person, place, and time.  Skin: Skin is warm and dry.    Psychiatric: He has a normal mood and affect.  Vitals reviewed.  Vitals:   12/01/16 1121  BP: 134/80  Pulse: 78  Resp: 17  Temp: 98 F (36.7 C)  TempSrc: Oral  SpO2: 97%  Weight: 206 lb (93.4 kg)  Height: 6' 0.5" (1.842 m)      Assessment & Plan:   FIONN STRACKE is a 51 y.o. male Elevated serum creatinine - Plan: Comprehensive metabolic panel  -Likely elevated from recent nephrolith. Repeat testing.  Essential hypertension - Plan: lisinopril (PRINIVIL,ZESTRIL) 10 MG tablet  - Stable. Continue lisinopril same dose, CMP pending.  Hyperglycemia - Plan: Hemoglobin A1c  - Check A1c.  Screening for hyperlipidemia - Plan: Lipid panel  Anemia, unspecified type -  Plan: CBC  - Repeat CBC since hospital eval as hematoma likely cause of previous anemia.  Meds ordered this encounter  Medications  . aspirin EC 81 MG tablet    Sig: Take 81 mg by mouth daily.  Marland Kitchen lisinopril (PRINIVIL,ZESTRIL) 10 MG tablet    Sig: Take 1 tablet (10 mg total) by mouth daily.    Dispense:  90 tablet    Refill:  1   Patient Instructions   No change in blood pressure medications today. I will check cholesterol, kidney, liver tests, three-month diabetes test, blood counts. I suspect your kidney function will be better now than what it was a few weeks ago. I will also check your blood count as that was slightly low in the ER. I suspect that will also be improved. Follow-up within 6 months for a physical, sooner if you have new concerns or questions.    IF you received an x-ray today, you will receive an invoice from [facility]. Please contact [facility] at [phone] with questions or concerns regarding your invoice.   IF you received labwork today, you will receive an invoice from [place]. Please contact [facility] at [phone] with questions or concerns regarding your invoice.   Our billing staff will not be able to assist you with questions regarding bills from these  companies.  You will be contacted with the lab results as soon as they are available. The fastest way to get your results is to activate your My Chart account. Instructions are located on the last page of this paperwork. If you have not heard from [place] regarding the results in 2 weeks, please contact this office.       I personally performed the services described in this documentation, which was scribed in my presence. The recorded information has been reviewed and considered for accuracy and completeness, addended by me as needed, and agree with information above.  Signed,   Meredith Staggers, MD Primary Care at [facility].  12/04/16 1:53 PM

## 2016-12-02 LAB — LIPID PANEL
CHOL/HDL RATIO: 3.6 ratio (ref 0.0–5.0)
Cholesterol, Total: 136 mg/dL (ref 100–199)
HDL: 38 mg/dL — ABNORMAL LOW (ref >39)
LDL CALC: 84 mg/dL (ref 0–99)
TRIGLYCERIDES: 70 mg/dL (ref 0–149)
VLDL Cholesterol Cal: 14 mg/dL (ref 5–40)

## 2016-12-02 LAB — COMPREHENSIVE METABOLIC PANEL
ALBUMIN: 4.2 g/dL (ref 3.5–5.5)
ALT: 14 IU/L (ref 0–44)
AST: 19 IU/L (ref 0–40)
Albumin/Globulin Ratio: 1.9 (ref 1.2–2.2)
Alkaline Phosphatase: 102 IU/L (ref 39–117)
BUN/Creatinine Ratio: 12 (ref 9–20)
BUN: 11 mg/dL (ref 6–24)
Bilirubin Total: 0.4 mg/dL (ref 0.0–1.2)
CALCIUM: 8.8 mg/dL (ref 8.7–10.2)
CO2: 23 mmol/L (ref 18–29)
CREATININE: 0.89 mg/dL (ref 0.76–1.27)
Chloride: 101 mmol/L (ref 96–106)
GFR calc Af Amer: 114 mL/min/{1.73_m2} (ref >59)
GFR, EST NON AFRICAN AMERICAN: 98 mL/min/{1.73_m2} (ref >59)
GLOBULIN, TOTAL: 2.2 g/dL (ref 1.5–4.5)
Glucose: 67 mg/dL (ref 65–99)
Potassium: 4.3 mmol/L (ref 3.5–5.2)
SODIUM: 141 mmol/L (ref 134–144)
Total Protein: 6.4 g/dL (ref 6.0–8.5)

## 2016-12-02 LAB — CBC
Hematocrit: 33.2 % — ABNORMAL LOW (ref 37.5–51.0)
Hemoglobin: 10.9 g/dL — ABNORMAL LOW (ref 13.0–17.7)
MCH: 28.8 pg (ref 26.6–33.0)
MCHC: 32.8 g/dL (ref 31.5–35.7)
MCV: 88 fL (ref 79–97)
PLATELETS: 197 10*3/uL (ref 150–379)
RBC: 3.78 x10E6/uL — ABNORMAL LOW (ref 4.14–5.80)
RDW: 14.4 % (ref 12.3–15.4)
WBC: 4.1 10*3/uL (ref 3.4–10.8)

## 2016-12-02 LAB — HEMOGLOBIN A1C
Est. average glucose Bld gHb Est-mCnc: 91 mg/dL
Hgb A1c MFr Bld: 4.8 % (ref 4.8–5.6)

## 2016-12-14 DIAGNOSIS — D649 Anemia, unspecified: Secondary | ICD-10-CM | POA: Diagnosis not present

## 2016-12-14 DIAGNOSIS — E119 Type 2 diabetes mellitus without complications: Secondary | ICD-10-CM | POA: Diagnosis not present

## 2016-12-14 DIAGNOSIS — N200 Calculus of kidney: Secondary | ICD-10-CM | POA: Diagnosis not present

## 2016-12-14 DIAGNOSIS — I10 Essential (primary) hypertension: Secondary | ICD-10-CM | POA: Diagnosis not present

## 2016-12-14 DIAGNOSIS — M109 Gout, unspecified: Secondary | ICD-10-CM | POA: Diagnosis not present

## 2016-12-30 ENCOUNTER — Ambulatory Visit (INDEPENDENT_AMBULATORY_CARE_PROVIDER_SITE_OTHER): Payer: BLUE CROSS/BLUE SHIELD | Admitting: Family Medicine

## 2016-12-30 ENCOUNTER — Encounter: Payer: Self-pay | Admitting: Family Medicine

## 2016-12-30 VITALS — BP 134/86 | HR 64 | Temp 98.7°F | Resp 16 | Ht 72.5 in | Wt 203.0 lb

## 2016-12-30 DIAGNOSIS — Z91018 Allergy to other foods: Secondary | ICD-10-CM | POA: Diagnosis not present

## 2016-12-30 DIAGNOSIS — D649 Anemia, unspecified: Secondary | ICD-10-CM

## 2016-12-30 NOTE — Patient Instructions (Addendum)
  I will refer you to allergist.    If still anemic, can discuss if further evaluation is needed.   IF you received an x-ray today, you will receive an invoice from [facility]. Please contact [facility] at [phone] with questions or concerns regarding your invoice.   IF you received labwork today, you will receive an invoice from [place]. Please contact [facility] at [phone] with questions or concerns regarding your invoice.   Our billing staff will not be able to assist you with questions regarding bills from these companies.  You will be contacted with the lab results as soon as they are available. The fastest way to get your results is to activate your My Chart account. Instructions are located on the last page of this paperwork. If you have not heard from [place] regarding the results in 2 weeks, please contact this office.

## 2016-12-30 NOTE — Progress Notes (Signed)
Subjective:  By signing my name below, I, Juan Nelson, attest that this documentation has been prepared under the direction and in the presence of Juan Flood, MD Electronically Signed: Charline Bills, ED Scribe 12/30/2016 at 8:20 AM.   Patient ID: Juan Nelson, male    DOB: [birth year], 51 y.o.   MRN: [id number]  Chief Complaint  Patient presents with  . Follow-up    labs, needs referral to allergist   HPI  Juan Nelson is a 51 y.o. male who presents to Primary Care at [facility] for follow-up. Last seen 4/3. Noted to have elevated creatinine from previous ER visit. Thought to be due to recent kidney stone and anemia thought to be due to hematoma. Creatinine normalized to 0.89. Other electrolytes were okay. Pt was still anemic with hemoglobin 10.9. Here for recheck cbc and referral to allergist.  Lab Results  Component Value Date   WBC 4.1 12/01/2016   HGB 10.2 (L) 11/15/2016   HCT 33.2 (L) 12/01/2016   MCV 88 12/01/2016   PLT 197 12/01/2016  He reports a h/o anemia in the 1990s with unknown etiology. Pt states that his mother has a h/o chronic anemia as well. Pt denies melena, nosebleeds, hematuria, bleeding gums.   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 He was last seen by an allergist ~10 years ago. Pt avoids consuming other seeds due to unknown reactions but has noticed sneezing after consuming certain foods. He denies hives, swelling, tightening of the throat with other foods. He does not use any seasonal allergy treatments.   Patient Active Problem List   Diagnosis Date Noted  . History of kidney stones 04/18/2014  . Essential hypertension, benign 04/18/2014  . Essential hypertension 04/18/2014  . LFT elevation 10/08/2012  . Hyperglycemia 10/08/2012   Past Medical History:  Diagnosis Date  . Allergy   . Arthritis    right knee  . GERD (gastroesophageal reflux disease)   .  History of kidney stones    multople stones   . Hypertension   . Kidney stone   . Sleep apnea    Past Surgical History:  Procedure Laterality Date  . EXTRACORPOREAL SHOCK WAVE LITHOTRIPSY Right 11/05/2016   Procedure: RIGHT EXTRACORPOREAL SHOCK WAVE LITHOTRIPSY (ESWL);  Surgeon: Malen Gauze, MD;  Location: [facility];  Service: Urology;  Laterality: Right;  . GANGLION CYST EXCISION  1989  . LAPAROSCOPIC GASTRIC BANDING    . SPINE SURGERY     Allergies  Allergen Reactions  . Pumpkin Seed Hives and Swelling    Swelling of throat, tongue and lips.   Prior to Admission medications   Medication Sig Start Date End Date Taking? Authorizing Provider  aspirin EC 81 MG tablet Take 81 mg by mouth daily.    Historical Provider, MD  cholecalciferol (VITAMIN D) 1000 UNITS tablet Take 1,000 Units by mouth daily.    Historical Provider, MD  diclofenac (VOLTAREN) 75 MG EC tablet TAKE 1 TABLET (75 MG TOTAL) BY MOUTH 2 (TWO) TIMES DAILY AS NEEDED. Patient taking differently: Take 75 mg by mouth 2 (two) times daily as needed for mild pain or moderate pain.  01/24/16   Collene Gobble, MD  HYDROcodone-acetaminophen (NORCO) 5-325 MG tablet Take 1-2 tablets by mouth every 6 (six) hours as needed. 06/18/16   Bing Neighbors, FNP  lisinopril (PRINIVIL,ZESTRIL) 10 MG tablet Take 1 tablet (10 mg total) by  mouth daily. 12/01/16   Juan Flood, MD  Multiple Vitamin (MULTIVITAMIN WITH MINERALS) TABS Take 1 tablet by mouth daily.    Historical Provider, MD  ondansetron (ZOFRAN) 4 MG tablet Take 1 tablet (4 mg total) by mouth every 6 (six) hours as needed for nausea or vomiting. 11/15/16   Dione Booze, MD  oxyCODONE-acetaminophen (PERCOCET) 5-325 MG tablet Take 1 tablet by mouth every 4 (four) hours as needed for moderate pain. 11/15/16   Dione Booze, MD  tamsulosin [facility]) 0.4 MG CAPS capsule Take 1 tablet daily as needed for symptoms of kidney stone 11/15/16   Dione Booze, MD   Social History   Social History    . Marital status: Single    Spouse name: N/A  . Number of children: N/A  . Years of education: N/A   Occupational History  . Not on file.   Social History Main Topics  . Smoking status: Never Smoker  . Smokeless tobacco: Never Used  . Alcohol use No  . Drug use: No  . Sexual activity: Yes   Other Topics Concern  . Not on file   Social History Narrative  . No narrative on file   Review of Systems  HENT: Negative for nosebleeds.   Gastrointestinal: Negative for blood in stool.  Genitourinary: Negative for hematuria.  Skin: Negative for rash.      Objective:   Physical Exam  Constitutional: He is oriented to person, place, and time. He appears well-developed and well-nourished.  HENT:  Head: Normocephalic and atraumatic.  Right Ear: Tympanic membrane, external ear and ear canal normal.  Left Ear: Tympanic membrane, external ear and ear canal normal.  Nose: No rhinorrhea. Right sinus exhibits no maxillary sinus tenderness and no frontal sinus tenderness. Left sinus exhibits no maxillary sinus tenderness and no frontal sinus tenderness.  Mouth/Throat: Oropharynx is clear and moist and mucous membranes are normal. No oropharyngeal exudate or posterior oropharyngeal erythema.  Eyes: Conjunctivae are normal. Pupils are equal, round, and reactive to light.  Neck: Neck supple.  Cardiovascular: Normal rate, regular rhythm, normal heart sounds and intact distal pulses.   No murmur heard. Pulmonary/Chest: Effort normal and breath sounds normal. He has no wheezes. He has no rhonchi. He has no rales.  Abdominal: Soft. There is no tenderness.  Lymphadenopathy:    He has no cervical adenopathy.  Neurological: He is alert and oriented to person, place, and time.  Skin: Skin is warm and dry. No rash noted.  Psychiatric: He has a normal mood and affect. His behavior is normal.  Vitals reviewed.  Vitals:   12/30/16 0810  BP: 134/86  Pulse: 64  Resp: 16  Temp: 98.7 F (37.1 C)   TempSrc: Oral  SpO2: 98%  Weight: 203 lb (92.1 kg)  Height: 6' 0.5" (1.842 m)      Assessment & Plan:  Juan Nelson is a 51 y.o. male Anemia, unspecified type - Plan: CBC with Differential/Platelet  - History of same by report. Repeat CBC to assess for improvement from previous visit. If still low, consider adding iron panel or repeat eval with hematology if needed.  Food allergy - Plan: Ambulatory referral to Allergy  - Refer to allergist to determine if other testing needed specifically for other nuts or seeds. Avoidance of these for now.  No orders of the defined types were placed in this encounter.  Patient Instructions    I will refer you to allergist.    If still anemic, can discuss  if further evaluation is needed.   IF you received an x-ray today, you will receive an invoice from [facility]. Please contact [facility] at [phone] with questions or concerns regarding your invoice.   IF you received labwork today, you will receive an invoice from [place]. Please contact [facility] at [phone] with questions or concerns regarding your invoice.   Our billing staff will not be able to assist you with questions regarding bills from these companies.  You will be contacted with the lab results as soon as they are available. The fastest way to get your results is to activate your My Chart account. Instructions are located on the last page of this paperwork. If you have not heard from [place] regarding the results in 2 weeks, please contact this office.       I personally performed the services described in this documentation, which was scribed in my presence. The recorded information has been reviewed and considered for accuracy and completeness, addended by me as needed, and agree with information above.  Signed,   Meredith Staggers, MD Primary Care at [facility].  12/30/16 8:36 AM

## 2016-12-31 LAB — CBC WITH DIFFERENTIAL/PLATELET
BASOS ABS: 0 10*3/uL (ref 0.0–0.2)
Basos: 0 %
EOS (ABSOLUTE): 0.2 10*3/uL (ref 0.0–0.4)
Eos: 5 %
HEMOGLOBIN: 12.4 g/dL — AB (ref 13.0–17.7)
Hematocrit: 37.1 % — ABNORMAL LOW (ref 37.5–51.0)
IMMATURE GRANS (ABS): 0 10*3/uL (ref 0.0–0.1)
IMMATURE GRANULOCYTES: 0 %
LYMPHS: 30 %
Lymphocytes Absolute: 1.1 10*3/uL (ref 0.7–3.1)
MCH: 29.5 pg (ref 26.6–33.0)
MCHC: 33.4 g/dL (ref 31.5–35.7)
MCV: 88 fL (ref 79–97)
MONOCYTES: 7 %
Monocytes Absolute: 0.3 10*3/uL (ref 0.1–0.9)
NEUTROS ABS: 2.2 10*3/uL (ref 1.4–7.0)
NEUTROS PCT: 58 %
PLATELETS: 172 10*3/uL (ref 150–379)
RBC: 4.21 x10E6/uL (ref 4.14–5.80)
RDW: 15.1 % (ref 12.3–15.4)
WBC: 3.8 10*3/uL (ref 3.4–10.8)

## 2017-02-08 DIAGNOSIS — Z9884 Bariatric surgery status: Secondary | ICD-10-CM | POA: Insufficient documentation

## 2017-02-08 DIAGNOSIS — Z4651 Encounter for fitting and adjustment of gastric lap band: Secondary | ICD-10-CM | POA: Diagnosis not present

## 2017-02-08 DIAGNOSIS — Z713 Dietary counseling and surveillance: Secondary | ICD-10-CM | POA: Diagnosis not present

## 2017-02-08 DIAGNOSIS — Z48815 Encounter for surgical aftercare following surgery on the digestive system: Secondary | ICD-10-CM | POA: Diagnosis not present

## 2017-02-08 DIAGNOSIS — Z6826 Body mass index (BMI) 26.0-26.9, adult: Secondary | ICD-10-CM | POA: Diagnosis not present

## 2017-02-18 DIAGNOSIS — N200 Calculus of kidney: Secondary | ICD-10-CM | POA: Diagnosis not present

## 2017-02-26 DIAGNOSIS — N200 Calculus of kidney: Secondary | ICD-10-CM | POA: Diagnosis not present

## 2017-02-26 DIAGNOSIS — D649 Anemia, unspecified: Secondary | ICD-10-CM | POA: Diagnosis not present

## 2017-03-11 DIAGNOSIS — N200 Calculus of kidney: Secondary | ICD-10-CM | POA: Diagnosis not present

## 2017-03-11 DIAGNOSIS — I10 Essential (primary) hypertension: Secondary | ICD-10-CM | POA: Diagnosis not present

## 2017-03-11 DIAGNOSIS — D649 Anemia, unspecified: Secondary | ICD-10-CM | POA: Diagnosis not present

## 2017-03-11 DIAGNOSIS — E119 Type 2 diabetes mellitus without complications: Secondary | ICD-10-CM | POA: Diagnosis not present

## 2017-03-12 ENCOUNTER — Other Ambulatory Visit: Payer: Self-pay | Admitting: Nephrology

## 2017-03-12 DIAGNOSIS — N200 Calculus of kidney: Secondary | ICD-10-CM

## 2017-03-15 ENCOUNTER — Ambulatory Visit
Admission: RE | Admit: 2017-03-15 | Discharge: 2017-03-15 | Disposition: A | Discharge status: Home / Self Care | Payer: BLUE CROSS/BLUE SHIELD | Source: Ambulatory Visit | Attending: Nephrology | Admitting: Nephrology

## 2017-03-15 DIAGNOSIS — N200 Calculus of kidney: Secondary | ICD-10-CM | POA: Diagnosis not present

## 2017-03-17 ENCOUNTER — Ambulatory Visit (INDEPENDENT_AMBULATORY_CARE_PROVIDER_SITE_OTHER): Payer: BLUE CROSS/BLUE SHIELD | Admitting: Allergy & Immunology

## 2017-03-17 ENCOUNTER — Encounter: Payer: Self-pay | Admitting: Allergy & Immunology

## 2017-03-17 VITALS — BP 120/78 | HR 68 | Temp 97.7°F | Resp 18 | Ht 72.5 in | Wt 213.8 lb

## 2017-03-17 DIAGNOSIS — T7800XA Anaphylactic reaction due to unspecified food, initial encounter: Secondary | ICD-10-CM | POA: Insufficient documentation

## 2017-03-17 DIAGNOSIS — J302 Other seasonal allergic rhinitis: Secondary | ICD-10-CM | POA: Diagnosis not present

## 2017-03-17 DIAGNOSIS — T7800XD Anaphylactic reaction due to unspecified food, subsequent encounter: Secondary | ICD-10-CM

## 2017-03-17 MED ORDER — EPINEPHRINE 0.3 MG/0.3ML IJ SOAJ: INTRAMUSCULAR | 1 refills | Status: DC

## 2017-03-17 NOTE — Patient Instructions (Addendum)
1. Anaphylactic shock due to food - We will get testing to: flaxseed, pine nut, sesame seed, pumpkin, and sunflower seed. Audry Riles- AuviQ script sent in. - We will call with results.  - We can do in office testing to the others (food challenge in the office setting)   2. Seasonal allergic rhinitis - Testing today showed: trees and cat - Avoidance measures provided. - Use antihistamines as needed.   3. Return in about 1 year (around 03/17/2018).   Please inform us of any Emergency Department visits, hospitalizations, or changes in symptoms. Call us before going to the ED for breathing or allergy symptoms since we might be able to fit you in for a sick visit. Feel free to contact us anytime with any questions, problems, or concerns.  It was a pleasure to meet you today! Happy summer!   Websites that have reliable patient information: 1. American Academy of Asthma, Allergy, and Immunology: www.aaaai.org 2. Food Allergy Research and Education (FARE): foodallergy.org 3. Mothers of Asthmatics: http://www.asthmacommunitynetwork.org 4. American College of Allergy, Asthma, and Immunology: www.acaai.org

## 2017-03-17 NOTE — Progress Notes (Addendum)
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 Anaphylactic shock due to food - We will get testing to: flaxseed, pine nut, sesame seed, pumpkin, and sunflower seed. Juan Nelson- AuviQ script sent in. - We will call with results.  - We can do in office testing to the others (food challenge in the office setting). - Chia IgE is not available, therefore this is one seed that we might have to evaluate.  - Seeds could be an integral part of his diet since he is vegan.   2. Seasonal allergic rhinitis - Testing today showed: trees and cat - Avoidance measures provided. - Use antihistamines as needed.   3. Return in about 1 year (around 03/17/2018).   Subjective:   Juan Nelson is a 51 y.o. male presenting today for evaluation of  Chief Complaint  Patient presents with  . Allergy Testing    Juan Nelson has a history of the following: Patient Active Problem List   Diagnosis Date Noted  . History of kidney stones 04/18/2014  . Essential hypertension, benign 04/18/2014  . Essential hypertension 04/18/2014  . LFT elevation 10/08/2012  . Hyperglycemia 10/08/2012    History obtained from: chart review and the patient.  Juan Nelson was referred by [name], Jeffrey R, MD.     Juan Nelson is a 51 y.o. male presenting for evaluation of food allergy. The first episode occurred twenty years ago when the patient ate bread with seeds on it, which resulted in an emergency department visit. The time from seed consumption to when he experiences his first symptom is within minutes.  He describes that he will first experience a tingling and itching sensation in various parts of his body. It can be his head, arms, hands, or legs, but will vary. He will get hives and his skin will become blotchy and red. He does not have  any pictures of these reactions. His throat will also swell and he will have difficulty breathing. If he does not take a benadryl he will have facial swelling and his face will become "deformed." He denies nausea, vomiting, or diarrhea with these episodes.   His most recent episode was in January 2018. He ate hummus and immediately felt that something was wrong. He began to have the tingling and itching sensation, and immediately took three Benadryl tablets. After the episode he read the ingredients of the hummus and noted that it had seeds in it.  It takes approximately 75 minutes for the Benadryl to relieve his symptoms. He typically is successful at avoiding seeds, but these episodes will happen approximately twice a year. He had an EpiPen many years ago, but never used it.   The patient is a vegan (7 months) and denies any adverse reactions with tree nuts. He can eat almonds, peanuts, walnuts, cashews, and pecans without any adverse reactions. He does not eat dairy. He denies any adverse reactions to fish, shellfish, wheat, breads, or crackers, but he will avoid bread with seeds on it. He eats all different types of fruits and vegetables with no adverse reaction. He has no problems with spices.    The patient states he spends a lot of time outdoors and will occasionally experience mild allergic rhinitis symptoms. He states he is very allergic to cats and will get itchy/watery eyes, sneezing, and  runny nose. He tries his best to avoid cats. Four years ago, he had allergy testing that was positive for dust mites and mold.  He does not take any medication for allergies. He has taken Zyrtec in the past but does not routinely take it.   Otherwise, there is no history of other atopic diseases, including asthma, drug allergies, stinging insect allergies, or urticaria. There is no significant infectious history. Vaccinations are up to date.    Past Medical History: Patient Active Problem List   Diagnosis  Date Noted  . History of kidney stones 04/18/2014  . Essential hypertension, benign 04/18/2014  . Essential hypertension 04/18/2014  . LFT elevation 10/08/2012  . Hyperglycemia 10/08/2012    Medication List:  Allergies as of 03/17/2017      Reactions   Pumpkin Seed Hives, Swelling   Swelling of throat, tongue and lips. Swelling of throat, tongue and lips.      Medication List       Accurate as of 03/17/17 10:44 AM. Always use your most recent med list.          aspirin EC 81 MG tablet Take 81 mg by mouth daily.   B-12 DUAL SPECTRUM 5000 MCG Tbcr Generic drug:  Cyanocobalamin ER   cholecalciferol 1000 units tablet Commonly known as:  VITAMIN D Take 1,000 Units by mouth daily.   diclofenac 75 MG EC tablet Commonly known as:  VOLTAREN TAKE 1 TABLET (75 MG TOTAL) BY MOUTH 2 (TWO) TIMES DAILY AS NEEDED.   HYDROcodone-acetaminophen 5-325 MG tablet Commonly known as:  NORCO Take 1-2 tablets by mouth every 6 (six) hours as needed.   lisinopril 10 MG tablet Commonly known as:  PRINIVIL,ZESTRIL Take 1 tablet (10 mg total) by mouth daily.   multivitamin with minerals Tabs tablet Take 1 tablet by mouth daily.   ondansetron 4 MG tablet Commonly known as:  ZOFRAN Take 1 tablet (4 mg total) by mouth every 6 (six) hours as needed for nausea or vomiting.   oxyCODONE-acetaminophen 5-325 MG tablet Commonly known as:  PERCOCET Take 1 tablet by mouth every 4 (four) hours as needed for moderate pain.   tamsulosin 0.4 MG Caps capsule Commonly known as:  FLOMAX Take 1 tablet daily as needed for symptoms of kidney stone       Birth History: non-contributory.   Developmental History: non-contributory.   Past Surgical History: Past Surgical History:  Procedure Laterality Date  . EXTRACORPOREAL SHOCK WAVE LITHOTRIPSY Right 11/05/2016   Procedure: RIGHT EXTRACORPOREAL SHOCK WAVE LITHOTRIPSY (ESWL);  Surgeon: Malen Gauze, MD;  Location: [facility];  Service: Urology;   Laterality: Right;  . GANGLION CYST EXCISION  1989  . LAPAROSCOPIC GASTRIC BANDING    . SPINE SURGERY       Family History: Family History  Problem Relation Age of Onset  . Hypertension Mother   . Diabetes Father   . Cancer Maternal Grandmother   . Depression Maternal Grandfather   . Alzheimer's disease Paternal Grandmother   . Cancer Paternal Grandfather      Social History: Korben lives in a 53 year old house. There is no water damage or mildew. There is carpet throughout the house. There are no animals located inside the home. There are no roaches in the home. There is no exposure to tobacco smoke. 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   Review of Systems: a 14-point review of systems is  pertinent for what is mentioned in HPI.  Otherwise, all other systems were negative. Constitutional: negative other than that listed in the HPI Eyes: negative other than that listed in the HPI Ears, nose, mouth, throat, and face: negative other than that listed in the HPI Respiratory: negative other than that listed in the HPI Cardiovascular: negative other than that listed in the HPI Gastrointestinal: negative other than that listed in the HPI Genitourinary: negative other than that listed in the HPI Integument: negative other than that listed in the HPI Hematologic: negative other than that listed in the HPI Musculoskeletal: negative other than that listed in the HPI Neurological: negative other than that listed in the HPI Allergy/Immunologic: negative other than that listed in the HPI    Objective:   Blood pressure 120/78, pulse 68, temperature 97.7 F (36.5 C), resp. rate 18, height 6' 0.5" (1.842 m), weight 213 lb 12.8 oz (97 kg), SpO2 98 %. Body mass index is 28.6 kg/m.   Physical Exam:  General: Alert, interactive, in no acute distress. Very personable.  Eyes: No conjunctival injection present  on the right, No conjunctival injection present on the left, PERRL bilaterally, No discharge on the right and No discharge on the left Ears: Right TM pearly gray with normal light reflex and Left TM pearly gray with normal light reflex.  Nose/Throat: External nose within normal limits and septum midline, turbinates non-edematous without discharge, post-pharynx unremarkable without cobblestoning in the posterior oropharynx. Tonsils unremarklable without exudates Neck: Supple without thyromegaly.  Adenopathy: No enlarged lymph nodes appreciated in the anterior cervical, occipital, axillary, epitrochlear, inguinal, or popliteal regions. Lungs: Clear to auscultation without wheezing, rhonchi or rales. No increased work of breathing. CV: Normal S1/S2, no murmurs. Capillary refill <2 seconds.  Abdomen: Nondistended, nontender. No guarding or rebound tenderness. Bowel sounds present in all fields  Skin: Warm and dry, without lesions or rashes. Extremities:  No clubbing, cyanosis or edema. Neuro:   Grossly intact. No focal deficits appreciated. Responsive to questions.  Diagnostic studies:   Allergy Studies:   Indoor/Outdoor Percutaneous Adult Environmental Panel: positive to Box elder, [place] cottonwood and Medical laboratory scientific officer. Otherwise negative with adequate controls.     Landis Martins, MD Internal Medicine PGY1    I performed a history and physical examination of the patient and discussed his management with the resident. I reviewed the resident's note and agree with the documented findings and plan of care. The note in its entirety was edited by myself, including the physical exam, assessment, and plan.   Malachi Bonds, MD FAAAAI [facility]

## 2017-03-18 LAB — ALLERGEN, PUMPKIN (F225) IGE: Allergen, Pumpkin (f225) IgE: <0.1 kU/L

## 2017-03-18 LAB — ALLERGEN SUNFLOWER SEED K84: Sunflower Seed k84: <0.1 kU/L

## 2017-03-18 LAB — ALLERGEN SESAME F10

## 2017-03-18 LAB — ALLERGEN PINE NUT F253: Pine Nut f253: <0.1 kU/L

## 2017-03-19 LAB — TRYPTASE: TRYPTASE: 5 ug/L (ref <11)

## 2017-03-20 LAB — ALLERGEN FLAXSEED F333 IGE: Conventional Class: 0

## 2017-03-24 DIAGNOSIS — D485 Neoplasm of uncertain behavior of skin: Secondary | ICD-10-CM | POA: Diagnosis not present

## 2017-03-24 DIAGNOSIS — D2271 Melanocytic nevi of right lower limb, including hip: Secondary | ICD-10-CM | POA: Diagnosis not present

## 2017-03-24 DIAGNOSIS — D2272 Melanocytic nevi of left lower limb, including hip: Secondary | ICD-10-CM | POA: Diagnosis not present

## 2017-03-24 DIAGNOSIS — D225 Melanocytic nevi of trunk: Secondary | ICD-10-CM | POA: Diagnosis not present

## 2017-03-25 DIAGNOSIS — D2261 Melanocytic nevi of right upper limb, including shoulder: Secondary | ICD-10-CM | POA: Diagnosis not present

## 2017-03-25 DIAGNOSIS — D485 Neoplasm of uncertain behavior of skin: Secondary | ICD-10-CM | POA: Diagnosis not present

## 2017-04-26 ENCOUNTER — Encounter: Payer: Self-pay | Admitting: Allergy & Immunology

## 2017-04-26 DIAGNOSIS — T782XXD Anaphylactic shock, unspecified, subsequent encounter: Secondary | ICD-10-CM

## 2017-04-26 DIAGNOSIS — R232 Flushing: Secondary | ICD-10-CM

## 2017-04-28 DIAGNOSIS — D2261 Melanocytic nevi of right upper limb, including shoulder: Secondary | ICD-10-CM | POA: Diagnosis not present

## 2017-04-28 DIAGNOSIS — D485 Neoplasm of uncertain behavior of skin: Secondary | ICD-10-CM | POA: Diagnosis not present

## 2017-04-28 HISTORY — PX: NEVUS EXCISION: SHX2090

## 2017-04-28 NOTE — Telephone Encounter (Signed)
We received a message from [name] discussing some new symptoms he was having. Briefly, he had a reaction at a restaurant including flushing, erythema, urticaria, and abdominal distress. There were no new foods aside from an onion same and a sesame ginger dressing. He previously had testing that was negative to sesame, therefore we will get an IgE to onion and ginger. We will also get a 24 hour 5-HIAA level to rule out carcinoid syndrome, which I feel is rather unlikely. Patient agrees with the plan.  Malachi BondsJoel Gallagher, MD [facility]

## 2017-05-03 LAB — ALLERGEN, GINGER, RF270

## 2017-05-03 LAB — ALLERGEN, ONION, F48

## 2017-05-04 ENCOUNTER — Other Ambulatory Visit: Payer: Self-pay | Admitting: Allergy & Immunology

## 2017-05-06 ENCOUNTER — Ambulatory Visit (INDEPENDENT_AMBULATORY_CARE_PROVIDER_SITE_OTHER): Payer: BLUE CROSS/BLUE SHIELD | Admitting: Allergy & Immunology

## 2017-05-06 ENCOUNTER — Encounter: Payer: Self-pay | Admitting: Allergy & Immunology

## 2017-05-06 VITALS — BP 122/80 | HR 72 | Resp 16

## 2017-05-06 DIAGNOSIS — T782XXD Anaphylactic shock, unspecified, subsequent encounter: Secondary | ICD-10-CM

## 2017-05-06 DIAGNOSIS — L501 Idiopathic urticaria: Secondary | ICD-10-CM

## 2017-05-06 DIAGNOSIS — T7800XD Anaphylactic reaction due to unspecified food, subsequent encounter: Secondary | ICD-10-CM

## 2017-05-06 NOTE — Patient Instructions (Addendum)
1. Anaphylactic shock due to unknown trigger - Testing was negative to the entire food panel today.  - Our panel includes: Peanut, Soy, Wheat, Corn, Milk, Egg, Casein, Shellfish Mix, Fish Mix, Cashew, Tomato, Orange, Rice, Reliant EnergyPork, Malawiurkey, Par[place]Beef, NicolletLamb, Chicken, E. I. du PontWhite Potato, Banana, Apple, Peach, Sweet Potato, Green Pea, Strawberry, Onion, Cabbage, Carrots, Celery, Karaya Gum, Acacia (Arabic Gum), Cottonseed, Flounder, Trout, Shrimp, [place]rab, [place]una, [place] Villageantaloupe, [place] NorthWatermelon, ReightownPecan, KirbyWalnut, Highland Heightshocolate, ParksdaleGrape, Cucumber, [address] AvenueSaccharomycese Cerevisiae, [place] CityOyster, BeattyvilleLobster, FairplainsScallop, SalisburySalmon, HemlockAlmond, AlexandriaHazelnut, EstoniaBrazil nut, Kevinoconut, Cinnamon, Nutmeg, Ginger, Garlic, Black Pepper, Mustard, Barley, Oat, Rye, Sesame and Pineapple  - There is a the low positive predictive value of food allergy testing and hence the high possibility of false positives. - In contrast, food allergy testing has a high negative predictive value, therefore if testing is negative we can be relatively assured that they are indeed negative.  - We will talk to Tammy about any expenses associated with Xolair. - Consent signed today.  2. Seasonal allergic rhinitis (trees and cats) - Continue with antihistamines as needed.   3. Return in about 6 months (around 11/03/2017).   Please inform us of any Emergency Department visits, hospitalizations, or changes in symptoms. Call us before going to the ED for breathing or allergy symptoms since we might be able to fit you in for a sick visit. Feel free to contact us anytime with any questions, problems, or concerns.  It was a pleasure to see you again today! Enjoy the new school year!  Websites that have reliable patient information: 1. American Academy of Asthma, Allergy, and Immunology: www.aaaai.org 2. Food Allergy Research and Education (FARE): foodallergy.org 3. Mothers of Asthmatics: http://www.asthmacommunitynetwork.org 4. American College of Allergy, Asthma, and Immunology: www.acaai.org   Election Day  is coming up on Tuesday, November 6th! Make your voice heard! Register to vote at vote.org!

## 2017-05-06 NOTE — Progress Notes (Addendum)
FOLLOW UP  Date of Service/Encounter:  05/06/17   Assessment:   History concerning for idiopathic anaphylaxis   Plan/Recommendations:   1. Anaphylactic shock due to unknown trigger (with a strong urticarial component) - Testing was negative to the entire food panel today.  - Our panel includes: Peanut, Soy, Wheat, Corn, Milk, Egg, Casein, Shellfish Mix, Fish Mix, Cashew, Tomato, Orange, Rice, Reliant EnergyPork, Malawiurkey, Oak GroveBeef, DriscollLamb, Chicken, E. I. du PontWhite Potato, Banana, Apple, Peach, Sweet Potato, Green Pea, Strawberry, Onion, Cabbage, Carrots, Celery, Karaya Gum, Acacia (Arabic Gum), Cottonseed, Flounder, Trout, Shrimp, Brilliantrab, Chokoloskeeuna, Empireantaloupe, HuetterWatermelon, GreenvillePecan, Mount PleasantWalnut, Atlantic Beachhocolate, Nun[place] IquaGrape, Cucumber, [address]Saccharomycese Cerevisiae, Sandy OaksOyster, ChilchinbitoLobster, Mount VernonScallop, BrownellSalmon, ArgusvilleAlmond, MercerHazelnut, EstoniaBrazil nut, West Sand Lakeoconut, Cinnamon, Nutmeg, Ginger, Garlic, Black Pepper, Mustard, Barley, Oat, Rye, Sesame and Pineapple  - There is a the low positive predictive value of food allergy testing and hence the high possibility of false positives. - In contrast, food allergy testing has a high negative predictive value, therefore if testing is negative we can be relatively assured that they are indeed negative.  - We will talk to Tammy about any expenses associated with Xolair. - Consent signed today and we will submit for approval for chronic idiopathic urticaria. - Urine studies pending to rule out carcinoid syndrome.  - We did consider the diagnosis of pheochromocytoma, but without the hypertension and tachycardia we felt this was less likely.  2. Seasonal allergic rhinitis (trees and cats) - Continue with antihistamines as needed.   3. Return in about 6 months (around 11/03/2017).     Subjective:   Juan Nelson is a 51 y.o. male presenting today for follow up of  Chief Complaint  Patient presents with  . Allergy Testing    Juan Nelson has a history of the following: Patient Active Problem List   Diagnosis Date Noted  .  Chronic idiopathic urticaria 05/06/2017  . Anaphylactic shock due to adverse food reaction 03/17/2017  . Other seasonal allergic rhinitis 03/17/2017  . History of kidney stones 04/18/2014  . Essential hypertension, benign 04/18/2014  . Essential hypertension 04/18/2014  . LFT elevation 10/08/2012  . Hyperglycemia 10/08/2012    History obtained from: chart review and patient.  Juan Nelson's Primary Care Provider is Juan Nelson.     Juan Nelson is a 51 y.o. male presenting for a skin testing. Briefly, I initially saw him in July for episodes of anaphylaxis which he felt were related to see. We did get blood testing to look for IgE to multiple seeds, which were all negative. He continued to have these episodes and was concerned that onion, ginger, or sesame were triggering them; however subsequent blood testing was negative. His reactions include acute onset flushing, erythema, or urticaria, and abdominal distress. Typically these occur outside of his home when he is at a restaurant or in public. He will often develop urticaria that start around his rectum, and then spread to include his entire body. He will take Benadryl, which will resolve the symptoms within 30-45 minutes. He does have an EpiPen, but has not needed to use it at this point. Typically does not have shortness of breath, throat swelling, or wheezing. We have considered the diagnosis of carcinoid syndrome, and we have ordered a 24-hour urine 5-HIAA level to rule this out. We wanted to bring him in for additional food testing to rule out unknown food triggers.  Since the last visit, he has done well. He has not had any further episodes. He did bring his urine sample in Tuesday,  but we are still awaiting results area and he did be given information on Xolair that we sent to him and is interested in giving this a try. Of note, he will be out of the country in November for 10 days when he goes to the [place]. Therefore he will  need some papers so that he can travel with his epinephrine. It should be noted that he never has any problems with tachycardia or hypertension episodes.  Otherwise, there have been no changes to his past medical history, surgical history, family history, or social history.  Hives from the last reaction:      Review of Systems: a 14-point review of systems is pertinent for what is mentioned in HPI.  Otherwise, all other systems were negative. Constitutional: negative other than that listed in the HPI Eyes: negative other than that listed in the HPI Ears, nose, mouth, throat, and face: negative other than that listed in the HPI Respiratory: negative other than that listed in the HPI Cardiovascular: negative other than that listed in the HPI Gastrointestinal: negative other than that listed in the HPI Genitourinary: negative other than that listed in the HPI Integument: negative other than that listed in the HPI Hematologic: negative other than that listed in the HPI Musculoskeletal: negative other than that listed in the HPI Neurological: negative other than that listed in the HPI Allergy/Immunologic: negative other than that listed in the HPI    Objective:    Physical Exam: deferred since this was just a skin testing visit   Diagnostic studies:   Full Food Panel: negative to Peanut, Soy, Wheat, Corn, Milk, Egg, Casein, Shellfish Mix, Fish Mix, Cashew, Tomato, Orange, Rice, [place], [place], [place], [place], Chicken, [place] Potato, Banana, Apple, Peach, Sweet Potato, Green Pea, Strawberry, Onion, Cabbage, Carrots, Celery, Karaya Gum, Acacia (Arabic Gum), Cottonseed, Flounder, Trout, Shrimp, [place], [place], [place], [place], [place], [place], [place], [place], Cucumber, [address], [place], [place], [place], [place], [place], [place], Eston[place] nut, [place], [place], [place], Ginger, Garlic, Black Pepper, Mustard, Barley, Oat, Rye, Sesame and Pineapple       Malachi Bonds, Nelson  FAAAAI [facility]

## 2017-05-07 LAB — 5 HIAA, QUANTITATIVE, URINE, 24 HOUR
5-HIAA, Ur: 1.4 mg/L
5-HIAA,Quant.,24 Hr Urine: 4.2 mg/24 hr (ref 0.0–14.9)

## 2017-05-25 DIAGNOSIS — K760 Fatty (change of) liver, not elsewhere classified: Secondary | ICD-10-CM | POA: Diagnosis not present

## 2017-05-28 ENCOUNTER — Telehealth: Payer: Self-pay | Admitting: Allergy & Immunology

## 2017-05-28 ENCOUNTER — Ambulatory Visit (INDEPENDENT_AMBULATORY_CARE_PROVIDER_SITE_OTHER): Payer: BLUE CROSS/BLUE SHIELD

## 2017-05-28 DIAGNOSIS — E6609 Other obesity due to excess calories: Secondary | ICD-10-CM | POA: Diagnosis not present

## 2017-05-28 DIAGNOSIS — L500 Allergic urticaria: Secondary | ICD-10-CM | POA: Diagnosis not present

## 2017-05-28 DIAGNOSIS — I10 Essential (primary) hypertension: Secondary | ICD-10-CM | POA: Diagnosis not present

## 2017-05-28 DIAGNOSIS — Z4651 Encounter for fitting and adjustment of gastric lap band: Secondary | ICD-10-CM | POA: Diagnosis not present

## 2017-05-28 DIAGNOSIS — M1710 Unilateral primary osteoarthritis, unspecified knee: Secondary | ICD-10-CM | POA: Diagnosis not present

## 2017-05-28 DIAGNOSIS — F341 Dysthymic disorder: Secondary | ICD-10-CM | POA: Diagnosis not present

## 2017-05-28 DIAGNOSIS — Z9884 Bariatric surgery status: Secondary | ICD-10-CM | POA: Diagnosis not present

## 2017-05-28 MED ORDER — OMALIZUMAB 150 MG ~~LOC~~ SOLR
300.0000 mg | SUBCUTANEOUS | Status: AC
  Administered 2017-05-28 – 2024-09-13 (×77): 300 mg via SUBCUTANEOUS

## 2017-05-28 NOTE — Telephone Encounter (Signed)
Left detailed message advising on reply from dr. Dellis Anes. I did let him know to call back to [place] or [place] with any questions.

## 2017-05-28 NOTE — Progress Notes (Signed)
Immunotherapy   Patient Details  Name: Juan Nelson MRN: [id number] Date of Birth: 10-30-1965  05/28/2017  Kallie Edward Galbreath started injections for  Xolair  Frequency:Every 4 weeks Epi-Pen:Epi-Pen Available  Consent signed and patient instructions given.   Riley Lam 05/28/2017, 11:13 AM

## 2017-05-28 NOTE — Telephone Encounter (Signed)
The note was not entirely clear. If he did the chia challenge at home and passed, he can certainly cancel the appointment. I would like to see him in three months for a regular follow up appointment.  Malachi Bonds, MD [facility]

## 2017-05-28 NOTE — Telephone Encounter (Signed)
Pt came in and thanks he has already had the food challenge on chia seeds but wanted to find out first before cancel his apoointment. [phone].Marland Kitchen

## 2017-06-03 ENCOUNTER — Encounter: Payer: Self-pay | Admitting: Family Medicine

## 2017-06-03 ENCOUNTER — Ambulatory Visit (INDEPENDENT_AMBULATORY_CARE_PROVIDER_SITE_OTHER): Payer: BLUE CROSS/BLUE SHIELD | Admitting: Family Medicine

## 2017-06-03 VITALS — BP 114/72 | HR 64 | Temp 98.4°F | Resp 16 | Ht 72.5 in | Wt 221.0 lb

## 2017-06-03 DIAGNOSIS — I10 Essential (primary) hypertension: Secondary | ICD-10-CM

## 2017-06-03 DIAGNOSIS — Z0000 Encounter for general adult medical examination without abnormal findings: Secondary | ICD-10-CM | POA: Diagnosis not present

## 2017-06-03 DIAGNOSIS — Z23 Encounter for immunization: Secondary | ICD-10-CM | POA: Diagnosis not present

## 2017-06-03 DIAGNOSIS — N200 Calculus of kidney: Secondary | ICD-10-CM | POA: Diagnosis not present

## 2017-06-03 MED ORDER — LISINOPRIL 10 MG PO TABS: 10.0000 mg | ORAL_TABLET | Freq: Every day | ORAL | 1 refills | Status: DC

## 2017-06-03 NOTE — Progress Notes (Signed)
Subjective:  By signing my name below, I, Stann Ore, attest that this documentation has been prepared under the direction and in the presence of Meredith Staggers, MD. Electronically Signed: Stann Ore, Scribe. 06/03/2017 , 9:24 AM .  Patient was seen in Room 10 .   Patient ID: Juan Nelson, male    DOB: Jul 31, 1966, 51 y.o.   MRN: [id number] Chief Complaint  Patient presents with  . Annual Exam   HPI  Juan Nelson is a 51 y.o. male Here for annual physical. 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 He also has history of OSA on Cpap, and nephrolithiasis.   Non-alcoholic steatohepatitis with stage 2 fibrosis He is followed by Dr. Alric Ran at [facility]. He had liver biopsy on Dec 2017, resolution of hepatitis and fibrosis. He was informed there are no concerns.   Allergies He was referred to allergist when seen in May. He was seen by Dr. Dellis Anes at [facility]. Apparently, he had negative food panel and planned on Xolair for idiopathic urticaria. He was started on Sept 28th with injections every 4 weeks.   Nephrolithiasis He had kidney stone in March. He is followed by urologist, Dr. Ronne Binning. He had a lithotripsy in March.   Low Vitamin D He takes Vitamin D 1000 units QD. His Vitamin D level was 35 on Sept 25th.   HTN He takes Lisinopril  QD.   Elevated Creatinine Creatinine was improved in April, may have been related to kidney stone. CMP was also normal with creatinine 0.9.   Lipid panel screening His total cholesterol 162, trig 89, LDL 108, and HDL 36.   Anemia Lab Results  Component Value Date   WBC 3.8 12/30/2016   HGB 12.4 (L) 12/30/2016   HCT 37.1 (L) 12/30/2016   MCV 88 12/30/2016   PLT 172 12/30/2016   He was improved at last visit. He reports anemia since 1990 and family history of  chronic anemia. On Sept 25th, his hemoglobin was 14.0 and platelets 134. He stopped his aspirin since his surgeries over this summer.   OSA on Cpap He is still on Cpap usage.   Hyperglycemia He had normal testing recently.   Lab Results  Component Value Date   HGBA1C 4.8 12/01/2016    Cancer Screening Colonoscopy: Last done in Jan 2016; plan for repeat in 7-10 years.  Prostate cancer screening:  Lab Results  Component Value Date   PSA 0.66 01/24/2016   Will defer PSA testing to urologist.   Immunizations Immunization History  Administered Date(s) Administered  . Influenza-Unspecified 05/27/2015, 05/01/2016   Tdap: agrees to update today.  Flu shot: received today.   HIV/Hep C screening: performed in 2016, 2017.   Depression Depression screen [facility] 2/9 06/03/2017 12/30/2016 12/01/2016 06/18/2016 01/24/2016  Decreased Interest 0 0 0 0 0  Down, Depressed, Hopeless 0 0 0 0 0  PHQ - 2 Score 0 0 0 0 0     Vision  Visual Acuity Screening   Right eye Left eye Both eyes  Without correction:  With correction:      [facility].   Dentist He is seen every 6 months.   Exercise He reports 5-6 days of exercise. He mentions having a lap band issue. He was able to eat more calories this summer. He had  his bands reset and adjusted at [facility] 6 days ago.   History of lap band surgery 2009 Wt Readings from Last 3 Encounters:  06/03/17 221 lb (100.2 kg)  03/17/17 213 lb 12.8 oz (97 kg)  12/30/16 203 lb (92.1 kg)    Patient Active Problem List   Diagnosis Date Noted  . Chronic idiopathic urticaria 05/06/2017  . Anaphylactic shock due to adverse food reaction 03/17/2017  . Other seasonal allergic rhinitis 03/17/2017  . History of kidney stones 04/18/2014  . Essential hypertension, benign 04/18/2014  . Essential hypertension 04/18/2014  . LFT elevation 10/08/2012  . Hyperglycemia 10/08/2012   Past Medical History:  Diagnosis Date  .  Allergy   . Arthritis    right knee  . GERD (gastroesophageal reflux disease)   . History of kidney stones    multople stones   . Hypertension   . Kidney stone   . Sleep apnea    Past Surgical History:  Procedure Laterality Date  . EXTRACORPOREAL SHOCK WAVE LITHOTRIPSY Right 11/05/2016   Procedure: RIGHT EXTRACORPOREAL SHOCK WAVE LITHOTRIPSY (ESWL);  Surgeon: Malen Gauze, MD;  Location: [facility];  Service: Urology;  Laterality: Right;  . GANGLION CYST EXCISION  1989  . LAPAROSCOPIC GASTRIC BANDING    . NEVUS EXCISION Right 04/28/2017  . SPINE SURGERY     Allergies  Allergen Reactions  . Pumpkin Seed Hives and Swelling    Swelling of throat, tongue and lips. Swelling of throat, tongue and lips.   Prior to Admission medications   Medication Sig Start Date End Date Taking? Authorizing Provider  aspirin EC 81 MG tablet Take 81 mg by mouth daily.   Yes [provider]  cholecalciferol (VITAMIN D) 1000 UNITS tablet Take 1,000 Units by mouth daily.   Yes [provider]  Cyanocobalamin ER (B-12 DUAL SPECTRUM) 5000 MCG TBCR  09/01/15  Yes [provider]  diclofenac (VOLTAREN) 75 MG EC tablet TAKE 1 TABLET (75 MG TOTAL) BY MOUTH 2 (TWO) TIMES DAILY AS NEEDED. Patient taking differently: Take 75 mg by mouth 2 (two) times daily as needed for mild pain or moderate pain.  01/24/16  Yes Collene Gobble, MD  EPINEPHrine (AUVI-Q) 0.3 mg/0.3 mL IJ SOAJ injection Use as directed for severe allergic reaction 03/17/17  Yes Alfonse Spruce, MD  HYDROcodone-acetaminophen [facility]) 5-325 MG tablet Take 1-2 tablets by mouth every 6 (six) hours as needed. 06/18/16  Yes Bing Neighbors, FNP  lisinopril (PRINIVIL,ZESTRIL) 10 MG tablet Take 1 tablet (10 mg total) by mouth daily. 12/01/16  Yes Shade Flood, MD  Multiple Vitamin (MULTIVITAMIN WITH MINERALS) TABS Take 1 tablet by mouth daily.   Yes [provider]  ondansetron (ZOFRAN) 4 MG tablet Take 1 tablet (4 mg  total) by mouth every 6 (six) hours as needed for nausea or vomiting. 11/15/16  Yes Dione Booze, MD  oxyCODONE-acetaminophen (PERCOCET) 5-325 MG tablet Take 1 tablet by mouth every 4 (four) hours as needed for moderate pain. 11/15/16  Yes Dione Booze, MD  tamsulosin [facility]) 0.4 MG CAPS capsule Take 1 tablet daily as needed for symptoms of kidney stone 11/15/16  Yes Dione Booze, MD   Social History   Social History  . Marital status: Single    Spouse name: N/A  . Number of children: N/A  . Years of education: N/A   Occupational History  . Not on file.   Social History Main Topics  . Smoking status: Never Smoker  . Smokeless tobacco:  Never Used  . Alcohol use No  . Drug use: No  . Sexual activity: Yes   Other Topics Concern  . Not on file   Social History Narrative  . No narrative on file   Review of Systems  Gastrointestinal: Positive for constipation.  Endocrine: Positive for polyphagia.  Skin: Positive for rash.   13 point ROS - negative except as above, denied acute sx's.      Objective:   Physical Exam  Constitutional: He is oriented to person, place, and time. He appears well-developed and well-nourished.  HENT:  Head: Normocephalic and atraumatic.  Right Ear: External ear normal.  Left Ear: External ear normal.  Mouth/Throat: Oropharynx is clear and moist.  Moderate cerumen bilaterally, but not completely obstructed  Eyes: Pupils are equal, round, and reactive to light. Conjunctivae and EOM are normal.  Neck: Normal range of motion. Neck supple. No thyromegaly present.  Cardiovascular: Normal rate, regular rhythm, normal heart sounds and intact distal pulses.   Pulmonary/Chest: Effort normal and breath sounds normal. No respiratory distress. He has no wheezes.  Abdominal: Soft. He exhibits no distension. There is no tenderness. Hernia confirmed negative in the right inguinal area and confirmed negative in the left inguinal area.  Musculoskeletal: Normal range  of motion. He exhibits no edema or tenderness.  Lymphadenopathy:    He has no cervical adenopathy.  Neurological: He is alert and oriented to person, place, and time. He has normal reflexes.  Skin: Skin is warm and dry.  Psychiatric: He has a normal mood and affect. His behavior is normal.  Vitals reviewed.   Vitals:   06/03/17 0841  BP: 114/72  Pulse: 64  Resp: 16  Temp: 98.4 F (36.9 C)  SpO2: 99%  Weight: 221 lb (100.2 kg)  Height: 6' 0.5" (1.842 m)      Assessment & Plan:   Juan Nelson is a 51 y.o. male Annual physical exam  - -anticipatory guidance as below in AVS, screening labs above. Health maintenance items as above in HPI discussed/recommended as applicable.   - plans on discussing prostate testing with urology at appt today. Risks/benefits discussed.   Need for prophylactic vaccination and inoculation against influenza - Plan: Flu Vaccine QUAD 6+ mos PF IM (Fluarix Quad PF), Tdap vaccine greater than or equal to 7yo IM  Essential hypertension - Plan: lisinopril (PRINIVIL,ZESTRIL) 10 MG tablet  - stable on lisinopril. Recent labs with his specialist reviewed.   Meds ordered this encounter  Medications  . lisinopril (PRINIVIL,ZESTRIL) 10 MG tablet    Sig: Take 1 tablet (10 mg total) by mouth daily.    Dispense:  90 tablet    Refill:  1   Patient Instructions   Can stop aspirin at this time as borderline platelets and may not be needed.  Continue exercise, watch diet and we can keep an eye on weight with new lap  band settings over next 6 months.    Follow-up with urology as planned, and can discuss prostate cancer testing with exam and blood test.   Thanks for coming in today.   Keeping you healthy  Get these tests  Blood pressure- Have your blood pressure checked once a year by your healthcare provider.  Normal blood pressure is 120/80  Weight- Have your body mass index (BMI) calculated to screen for obesity.  BMI is a measure of body fat based on  height and weight. You can also calculate your own BMI at [email or web address].  Cholesterol- Have your  cholesterol checked every year.  Diabetes- Have your blood sugar checked regularly if you have high blood pressure, high cholesterol, have a family history of diabetes or if you are overweight.  Screening for Colon Cancer- Colonoscopy starting at age 15.  Screening may begin sooner depending on your family history and other health conditions. Follow up colonoscopy as directed by your Gastroenterologist.  Screening for Prostate Cancer- Both blood work (PSA) and a rectal exam help screen for Prostate Cancer.  Screening begins at age 88 with African-American men and at age 10 with Caucasian men.  Screening may begin sooner depending on your family history.  Take these medicines  Aspirin- One aspirin daily can help prevent Heart disease and Stroke.  Flu shot- Every fall.  Tetanus- Every 10 years.  Zostavax- Once after the age of 7 to prevent Shingles.  Pneumonia shot- Once after the age of 24; if you are younger than 3, ask your healthcare provider if you need a Pneumonia shot.  Take these steps  Don't smoke- If you do smoke, talk to your doctor about quitting.  For tips on how to quit, go to www.smokefree.gov or call 1-800-QUIT-NOW.  Be physically active- Exercise 5 days a week for at least 30 minutes.  If you are not already physically active start slow and gradually work up to 30 minutes of moderate physical activity.  Examples of moderate activity include walking briskly, mowing the yard, dancing, swimming, bicycling, etc.  Eat a healthy diet- Eat a variety of healthy food such as fruits, vegetables, low fat milk, low fat cheese, yogurt, lean meant, poultry, fish, beans, tofu, etc. For more information go to www.thenutritionsource.org  Drink alcohol in moderation- Limit alcohol intake to less than two drinks a day. Never drink and drive.  Dentist- Brush and floss twice daily;  visit your dentist twice a year.  Depression- Your emotional health is as important as your physical health. If you're feeling down, or losing interest in things you would normally enjoy please talk to your healthcare provider.  Eye exam- Visit your eye doctor every year.  Safe sex- If you may be exposed to a sexually transmitted infection, use a condom.  Seat belts- Seat belts can save your life; always wear one.  Smoke/Carbon Monoxide detectors- These detectors need to be installed on the appropriate level of your home.  Replace batteries at least once a year.  Skin cancer- When out in the sun, cover up and use sunscreen 15 SPF or higher.  Violence- If anyone is threatening you, please tell your healthcare provider. Living Will/ Health care power of attorney- Speak with your healthcare provider and family.                       IF you received an x-ray today, you will receive an invoice from [facility]. Please contact [facility] at [phone] with questions or concerns regarding your invoice.   IF you received labwork today, you will receive an invoice from [place]. Please contact [facility] at [phone] with questions or concerns regarding your invoice.   Our billing staff will not be able to assist you with questions regarding bills from these companies.  You will be contacted with the lab results as soon as they are available. The fastest way to get your results is to activate your My Chart account. Instructions are located on the last page of this paperwork. If you have not heard from [place] regarding the results in 2 weeks, please  contact this office.       I personally performed the services described in this documentation, which was scribed in my presence. The recorded information has been reviewed and considered for accuracy and completeness, addended by me as needed, and agree with information above.  Signed,   Meredith Staggers, MD Primary Care at  [facility].  06/03/17 1:46 PM

## 2017-06-03 NOTE — Patient Instructions (Addendum)
Can stop aspirin at this time as borderline platelets and may not be needed.  Continue exercise, watch diet and we can keep an eye on weight with new lap  band settings over next 6 months.    Follow-up with urology as planned, and can discuss prostate cancer testing with exam and blood test.   Thanks for coming in today.   Keeping you healthy  Get these tests  Blood pressure- Have your blood pressure checked once a year by your healthcare provider.  Normal blood pressure is 120/80  Weight- Have your body mass index (BMI) calculated to screen for obesity.  BMI is a measure of body fat based on height and weight. You can also calculate your own BMI at [email or web address].  Cholesterol- Have your cholesterol checked every year.  Diabetes- Have your blood sugar checked regularly if you have high blood pressure, high cholesterol, have a family history of diabetes or if you are overweight.  Screening for Colon Cancer- Colonoscopy starting at age 53.  Screening may begin sooner depending on your family history and other health conditions. Follow up colonoscopy as directed by your Gastroenterologist.  Screening for Prostate Cancer- Both blood work (PSA) and a rectal exam help screen for Prostate Cancer.  Screening begins at age 35 with African-American men and at age 30 with Caucasian men.  Screening may begin sooner depending on your family history.  Take these medicines  Aspirin- One aspirin daily can help prevent Heart disease and Stroke.  Flu shot- Every fall.  Tetanus- Every 10 years.  Zostavax- Once after the age of 33 to prevent Shingles.  Pneumonia shot- Once after the age of 39; if you are younger than 88, ask your healthcare provider if you need a Pneumonia shot.  Take these steps  Don't smoke- If you do smoke, talk to your doctor about quitting.  For tips on how to quit, go to www.smokefree.gov or call 1-800-QUIT-NOW.  Be physically active- Exercise 5 days a week for at  least 30 minutes.  If you are not already physically active start slow and gradually work up to 30 minutes of moderate physical activity.  Examples of moderate activity include walking briskly, mowing the yard, dancing, swimming, bicycling, etc.  Eat a healthy diet- Eat a variety of healthy food such as fruits, vegetables, low fat milk, low fat cheese, yogurt, lean meant, poultry, fish, beans, tofu, etc. For more information go to www.thenutritionsource.org  Drink alcohol in moderation- Limit alcohol intake to less than two drinks a day. Never drink and drive.  Dentist- Brush and floss twice daily; visit your dentist twice a year.  Depression- Your emotional health is as important as your physical health. If you're feeling down, or losing interest in things you would normally enjoy please talk to your healthcare provider.  Eye exam- Visit your eye doctor every year.  Safe sex- If you may be exposed to a sexually transmitted infection, use a condom.  Seat belts- Seat belts can save your life; always wear one.  Smoke/Carbon Monoxide detectors- These detectors need to be installed on the appropriate level of your home.  Replace batteries at least once a year.  Skin cancer- When out in the sun, cover up and use sunscreen 15 SPF or higher.  Violence- If anyone is threatening you, please tell your healthcare provider. Living Will/ Health care power of attorney- Speak with your healthcare provider and family.  IF you received an x-ray today, you will receive an invoice from [facility]. Please contact [facility] at [phone] with questions or concerns regarding your invoice.   IF you received labwork today, you will receive an invoice from [place]. Please contact [facility] at [phone] with questions or concerns regarding your invoice.   Our billing staff will not be able to assist you with questions regarding bills from these companies.  You will  be contacted with the lab results as soon as they are available. The fastest way to get your results is to activate your My Chart account. Instructions are located on the last page of this paperwork. If you have not heard from [place] regarding the results in 2 weeks, please contact this office.

## 2017-06-24 ENCOUNTER — Encounter: Payer: Self-pay | Admitting: Allergy & Immunology

## 2017-06-24 ENCOUNTER — Ambulatory Visit (INDEPENDENT_AMBULATORY_CARE_PROVIDER_SITE_OTHER): Payer: BLUE CROSS/BLUE SHIELD | Admitting: Allergy & Immunology

## 2017-06-24 ENCOUNTER — Ambulatory Visit: Payer: Self-pay

## 2017-06-24 VITALS — BP 118/70 | HR 64 | Resp 18

## 2017-06-24 DIAGNOSIS — T7800XD Anaphylactic reaction due to unspecified food, subsequent encounter: Secondary | ICD-10-CM | POA: Diagnosis not present

## 2017-06-24 NOTE — Patient Instructions (Addendum)
1. Anaphylactic shock due to food, subsequent encounter - You tolerated the chia challenge today. - Beware of symptoms of anaphylaxis over the next day or so. - Email/call with concerns. - My work cell is [phone]. - You can get your Xolair tomorrow. - Try to get a copy of the ingredients of the bread.  2. Return in about 6 months (around 12/23/2017).  Please inform us of any Emergency Department visits, hospitalizations, or changes in symptoms. Call us before going to the ED for breathing or allergy symptoms since we might be able to fit you in for a sick visit. Feel free to contact us anytime with any questions, problems, or concerns.  It was a pleasure to see you again today! Enjoy the fall season!  Websites that have reliable patient information: 1. American Academy of Asthma, Allergy, and Immunology: www.aaaai.org 2. Food Allergy Research and Education (FARE): foodallergy.org 3. Mothers of Asthmatics: http://www.asthmacommunitynetwork.org 4. American College of Allergy, Asthma, and Immunology: www.acaai.org   Election Day is coming up on Tuesday, November 6th! Although it is too late to register to vote by mail, you can still register up to November 5th at any of the early voting locations. Try to early vote in case there are problems with your registration!   If you are turned away at the polls, you have the right to request a provisional ballot, which is required by law!      [place] (open 8am - 5pm) First Floor [address], [place]   [place] (open 8am - 5pm) [address], [place]   [facility] (open 7am - 7pm)  [address], [place]   [facility] (open 7am - 7pm) [address], [place]   [place] (open 7am - 7pm) [address], [place]   [facility] (open 7am - 7pm) [address], [place]   [facility] (open 7am -  7pm) [address], [place]   [address] (open 7am - 7pm) [address], [place]   [facility] (open 7am - 7pm) [address], [place]

## 2017-06-24 NOTE — Progress Notes (Signed)
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 Anaphylactic shock due to food - [name] tolerated the chia challenge today. - Beware of symptoms of anaphylaxis over the next day or so. - Email/call with concerns. - My work cell is [phone]. - You can get your Xolair tomorrow. - Try to get a copy of the ingredients of the bread. - We are still unsure of the trigger, and it should be noted that he has tolerated scant amounts of some of these seeds in his diet since the last visit. - I did reassure Juan Nelson that he has negative testing to several seeds (sunflower, pumpkin, sesame, pine nut, flax seed), therefore with the high negative predictive value, I am confident that he will tolerate these at home.  - I reviewed the signs and symptoms of anaphylaxis with Juan Nelson and recommended that he have his EpiPen in place when he does these challenges. - I recommended that he introduce these seeds at home in a controlled manner, with 1-2 weeks between challenges.  - If he is nervous, we can certainly do these in the clinic setting but I have to balance this with the need to conserve his financial resources.  - Reviewed the risks/benefits of this plan with Juan Nelson.   2. Return in about 6 months (around 12/23/2017).  Subjective:   Juan Nelson is a 51 y.o. male presenting today for follow up of  Chief Complaint  Patient presents with  . Food/Drug Challenge    [employer] has a history of the following: Patient Active Problem List   Diagnosis Date Noted  . Chronic idiopathic urticaria 05/06/2017  . Anaphylactic shock due to adverse food reaction 03/17/2017  . Other seasonal allergic rhinitis 03/17/2017  . History of kidney stones 04/18/2014  . Essential hypertension, benign 04/18/2014  . Essential hypertension 04/18/2014  . LFT elevation 10/08/2012  .  Hyperglycemia 10/08/2012    History obtained from: chart review and patient.  Juan Nelson Primary Care Provider is Juan Flood, MD.     Juan Nelson is a 51 y.o. male presenting for a food challenge (chia seed). Briefly, I initially saw him in July for episodes of anaphylaxis which he felt were related to seeds. We did get blood testing to look for IgE to multiple seeds, which were all negative (sunflower, pumpkin, sesame, pine nut, flax seed). He continued to have these episodes and was concerned that onion, ginger, or sesame were triggering them; however subsequent blood testing was negative. His reactions include acute onset flushing, erythema, or urticaria, and abdominal distress. Typically these occur outside of his home when he is at a restaurant or in public. He will often develop urticaria that start around his rectum, and then spread to include his entire body. He will take Benadryl, which will resolve the symptoms within 30-45 minutes. He does have an EpiPen, but has not needed to use it at this point. Typically he does not have shortness of breath, throat swelling, or wheezing. We have considered the diagnosis of carcinoid syndrome, and we have ordered a 24-hour urine 5-HIAA level to rule this out; this testing was negative. He had testing to the entire food panel at the last visit in September, and this was all negative. We have started him on Xolair as treatment for presumed idiopathic anaphylaxis.   Since the last visit, he has done  well for the most part. He did start Xolair one month ago (next dose is today) and tolerated this without adverse event. He did have another reaction recently when he went to a type of bread served at [employer]. He is in the process of getting the ingredients so that we can figure out what might have triggered the reaction. The reaction was less severe and did not last as long as the rest of his reactions. This was after the Xolair was  started, therefore the Xolair might have been the reason that the reaction was less serious.   Otherwise, there have been no changes to his past medical history, surgical history, family history, or social history.    Review of Systems: a 14-point review of systems is pertinent for what is mentioned in HPI.  Otherwise, all other systems were negative. Constitutional: negative other than that listed in the HPI Eyes: negative other than that listed in the HPI Ears, nose, mouth, throat, and face: negative other than that listed in the HPI Respiratory: negative other than that listed in the HPI Cardiovascular: negative other than that listed in the HPI Gastrointestinal: negative other than that listed in the HPI Genitourinary: negative other than that listed in the HPI Integument: negative other than that listed in the HPI Hematologic: negative other than that listed in the HPI Musculoskeletal: negative other than that listed in the HPI Neurological: negative other than that listed in the HPI Allergy/Immunologic: negative other than that listed in the HPI    Objective:   Blood pressure 118/70, pulse 64, resp. rate 18, SpO2 98 %. There is no height or weight on file to calculate BMI.   Physical Exam: deferred since this was a food challenge appointment only   Diagnostic studies: none  Allergy Studies:   Selected Foods Panel (chia/positive/negative): negative to chia slurry with adequate controls.    Open graded chia seed oral challenge: The patient was able to tolerate the challenge today without adverse signs or symptoms. Vital signs were stable throughout the challenge and observation period. He received multiple doses separated by 15 minutes, each of which was separated by vitals and a brief physical exam. He received the following doses: lip rub, 1 gm, 2 gm, 4 gm, and 8 gm. He was monitored for 60 minutes following the last dose.   The patient had negative skin prick tests to  chia and was able to tolerate the open graded oral challenge today without adverse signs or symptoms. Therefore, he has the same risk of systemic reaction associated with the consumption of chia seed as the general population.      Malachi BondsJoel Tamelia Michalowski, MD FAAAAI [facility]

## 2017-06-25 ENCOUNTER — Ambulatory Visit (INDEPENDENT_AMBULATORY_CARE_PROVIDER_SITE_OTHER): Payer: BLUE CROSS/BLUE SHIELD

## 2017-06-25 DIAGNOSIS — L500 Allergic urticaria: Secondary | ICD-10-CM

## 2017-07-12 ENCOUNTER — Ambulatory Visit (INDEPENDENT_AMBULATORY_CARE_PROVIDER_SITE_OTHER): Payer: BLUE CROSS/BLUE SHIELD | Admitting: Emergency Medicine

## 2017-07-12 ENCOUNTER — Other Ambulatory Visit: Payer: Self-pay

## 2017-07-12 ENCOUNTER — Encounter: Payer: Self-pay | Admitting: Emergency Medicine

## 2017-07-12 VITALS — BP 140/90 | HR 86 | Temp 98.3°F | Resp 16 | Ht 72.0 in | Wt 237.4 lb

## 2017-07-12 DIAGNOSIS — J069 Acute upper respiratory infection, unspecified: Secondary | ICD-10-CM | POA: Diagnosis not present

## 2017-07-12 DIAGNOSIS — R05 Cough: Secondary | ICD-10-CM | POA: Diagnosis not present

## 2017-07-12 DIAGNOSIS — R059 Cough, unspecified: Secondary | ICD-10-CM

## 2017-07-12 MED ORDER — AZITHROMYCIN 250 MG PO TABS: ORAL_TABLET | ORAL | 0 refills | Status: DC

## 2017-07-12 NOTE — Patient Instructions (Addendum)
  Do not start Zithromax unless you get worse. Upper Respiratory Infection, Adult Most upper respiratory infections (URIs) are caused by a virus. A URI affects the nose, throat, and upper air passages. The most common type of URI is often called "the common cold." Follow these instructions at home:  Take medicines only as told by your doctor.  Gargle warm saltwater or take cough drops to comfort your throat as told by your doctor.  Use a warm mist humidifier or inhale steam from a shower to increase air moisture. This may make it easier to breathe.  Drink enough fluid to keep your pee (urine) clear or pale yellow.  Eat soups and other clear broths.  Have a healthy diet.  Rest as needed.  Go back to work when your fever is gone or your doctor says it is okay. ? You may need to stay home longer to avoid giving your URI to others. ? You can also wear a face mask and wash your hands often to prevent spread of the virus.  Use your inhaler more if you have asthma.  Do not use any tobacco products, including cigarettes, chewing tobacco, or electronic cigarettes. If you need help quitting, ask your doctor. Contact a doctor if:  You are getting worse, not better.  Your symptoms are not helped by medicine.  You have chills.  You are getting more short of breath.  You have brown or red mucus.  You have yellow or brown discharge from your nose.  You have pain in your face, especially when you bend forward.  You have a fever.  You have puffy (swollen) neck glands.  You have pain while swallowing.  You have white areas in the back of your throat. Get help right away if:  You have very bad or constant: ? Headache. ? Ear pain. ? Pain in your forehead, behind your eyes, and over your cheekbones (sinus pain). ? Chest pain.  You have long-lasting (chronic) lung disease and any of the following: ? Wheezing. ? Long-lasting cough. ? Coughing up blood. ? A change in your usual  mucus.  You have a stiff neck.  You have changes in your: ? Vision. ? Hearing. ? Thinking. ? Mood. This information is not intended to replace advice given to you by your health care provider. Make sure you discuss any questions you have with your health care provider. Document Released: 02/03/2008 Document Revised: 04/19/2016 Document Reviewed: 11/22/2013 Elsevier Interactive Patient Education  2018 [employer]    IF you received an x-ray today, you will receive an invoice from [facility]. Please contact [facility] at [phone] with questions or concerns regarding your invoice.   IF you received labwork today, you will receive an invoice from [facility]. Please contact [facility] at [phone] with questions or concerns regarding your invoice.   Our billing staff will not be able to assist you with questions regarding bills from these companies.  You will be contacted with the lab results as soon as they are available. The fastest way to get your results is to activate your My Chart account. Instructions are located on the last page of this paperwork. If you have not heard from us regarding the results in 2 weeks, please contact this office.

## 2017-07-12 NOTE — Progress Notes (Signed)
Juan Nelson 51 y.o.   Chief Complaint  Patient presents with  . Cough    x 3 days - productive with brown and green mucus    HISTORY OF PRESENT ILLNESS: This is a 51 y.o. male complaining of productive cough that started 3 days ago; better today; flying into [place] next Thursday.  Cough  This is a new problem. The current episode started in the past 7 days. The problem has been gradually improving. The cough is productive of sputum. Pertinent negatives include no chest pain, chills, ear pain, eye redness, fever, hemoptysis, myalgias, rash, sore throat, shortness of breath or wheezing. Risk factors: none. He has tried nothing for the symptoms. There is no history of asthma, COPD or pneumonia.     Prior to Admission medications   Medication Sig Start Date End Date Taking? Authorizing Provider  cholecalciferol (VITAMIN D) 1000 UNITS tablet Take 1,000 Units by mouth daily.   Yes [provider]  Cyanocobalamin ER (B-12 DUAL SPECTRUM) 5000 MCG TBCR  09/01/15  Yes [provider]  diclofenac (VOLTAREN) 75 MG EC tablet TAKE 1 TABLET (75 MG TOTAL) BY MOUTH 2 (TWO) TIMES DAILY AS NEEDED. Patient taking differently: Take 75 mg by mouth 2 (two) times daily as needed for mild pain or moderate pain.  01/24/16  Yes Collene Gobble, MD  EPINEPHrine (AUVI-Q) 0.3 mg/0.3 mL IJ SOAJ injection Use as directed for severe allergic reaction 03/17/17  Yes Alfonse Spruce, MD  HYDROcodone-acetaminophen [facility]) 5-325 MG tablet Take 1-2 tablets by mouth every 6 (six) hours as needed. 06/18/16  Yes Bing Neighbors, FNP  lisinopril (PRINIVIL,ZESTRIL) 10 MG tablet Take 1 tablet (10 mg total) by mouth daily. 06/03/17  Yes Shade Flood, MD  loratadine (CLARITIN) 10 MG tablet Take by mouth. 12/16/10  Yes [provider]  Multiple Vitamin (MULTIVITAMIN WITH MINERALS) TABS Take 1 tablet by mouth daily.   Yes [provider]  ondansetron (ZOFRAN) 4 MG tablet Take 1 tablet (4  mg total) by mouth every 6 (six) hours as needed for nausea or vomiting. 11/15/16  Yes Dione Booze, MD  oxyCODONE-acetaminophen (PERCOCET) 5-325 MG tablet Take 1 tablet by mouth every 4 (four) hours as needed for moderate pain. 11/15/16  Yes Dione Booze, MD  ranitidine (ZANTAC) 150 MG tablet Take by mouth. 12/16/10  Yes [provider]  tamsulosin (FLOMAX) 0.4 MG CAPS capsule Take 1 tablet daily as needed for symptoms of kidney stone 11/15/16  Yes Dione Booze, MD  DOCOSAHEXAENOIC ACID PO  05/01/16   [provider]  Glucosamine HCl 1500 MG TABS Take by mouth. 12/16/10   [provider]    Allergies  Allergen Reactions  . Pumpkin Seed Hives and Swelling    Swelling of throat, tongue and lips. Swelling of throat, tongue and lips.    Patient Active Problem List   Diagnosis Date Noted  . Chronic idiopathic urticaria 05/06/2017  . Other seasonal allergic rhinitis 03/17/2017  . History of kidney stones 04/18/2014  . Essential hypertension, benign 04/18/2014  . Essential hypertension 04/18/2014  . LFT elevation 10/08/2012  . Hyperglycemia 10/08/2012    Past Medical History:  Diagnosis Date  . Allergy   . Arthritis    right knee  . GERD (gastroesophageal reflux disease)   . History of kidney stones    multople stones   . Hypertension   . Kidney stone   . Sleep apnea     Past Surgical History:  Procedure Laterality  Date  . GANGLION CYST EXCISION  1989  . LAPAROSCOPIC GASTRIC BANDING    . NEVUS EXCISION Right 04/28/2017  . SPINE SURGERY      Social History   Socioeconomic History  . Marital status: Single    Spouse name: Not on file  . Number of children: Not on file  . Years of education: Not on file  . Highest education level: Not on file  Social Needs  . Financial resource strain: Not on file  . Food insecurity - worry: Not on file  . Food insecurity - inability: Not on file  . Transportation needs - medical: Not on file  . Transportation  needs - non-medical: Not on file  Occupational History  . Not on file  Tobacco Use  . Smoking status: Never Smoker  . Smokeless tobacco: Never Used  Substance and Sexual Activity  . Alcohol use: No  . Drug use: No  . Sexual activity: Yes  Other Topics Concern  . Not on file  Social History Narrative  . Not on file    Family History  Problem Relation Age of Onset  . Hypertension Mother   . Diabetes Father   . Cancer Maternal Grandmother   . Depression Maternal Grandfather   . Alzheimer's disease Paternal Grandmother   . Cancer Paternal Grandfather      Review of Systems  Constitutional: Negative.  Negative for chills and fever.  HENT: Negative.  Negative for ear pain and sore throat.   Eyes: Negative.  Negative for discharge and redness.  Respiratory: Positive for cough and sputum production. Negative for hemoptysis, shortness of breath and wheezing.   Cardiovascular: Negative for chest pain, palpitations and leg swelling.  Gastrointestinal: Negative.  Negative for abdominal pain, diarrhea, nausea and vomiting.  Genitourinary: Negative.   Musculoskeletal: Negative for myalgias.  Skin: Negative.  Negative for rash.  Endo/Heme/Allergies: Negative.   All other systems reviewed and are negative.  Vitals:   07/12/17 1403  BP: 140/90  Pulse: 86  Resp: 16  Temp: 98.3 F (36.8 C)  SpO2: 97%     Physical Exam  Constitutional: He is oriented to person, place, and time. He appears well-developed and well-nourished.  HENT:  Head: Normocephalic.  Right Ear: Tympanic membrane normal.  Left Ear: Tympanic membrane and external ear normal.  Nose: Nose normal.  Mouth/Throat: Oropharynx is clear and moist.  Eyes: Conjunctivae and EOM are normal. Pupils are equal, round, and reactive to light.  Neck: Normal range of motion. Neck supple. No JVD present. No thyromegaly present.  Cardiovascular: Normal rate, regular rhythm, normal heart sounds and intact distal pulses.    Pulmonary/Chest: Effort normal and breath sounds normal. No respiratory distress.  Abdominal: Soft. Bowel sounds are normal. He exhibits no distension. There is no tenderness.  Musculoskeletal: Normal range of motion.  Lymphadenopathy:    He has no cervical adenopathy.  Neurological: He is alert and oriented to person, place, and time. No sensory deficit. He exhibits normal muscle tone.  Skin: Skin is warm and dry. No rash noted.  Psychiatric: He has a normal mood and affect. His behavior is normal.  Vitals reviewed.    ASSESSMENT & PLAN: Juan Nelson was seen today for cough.  Diagnoses and all orders for this visit:  Cough  Acute upper respiratory infection  Other orders -     Discontinue: azithromycin (ZITHROMAX) 250 MG tablet; Sig as indicated -     azithromycin (ZITHROMAX) 250 MG tablet; Sig as indicated  Advised not  to start Zithromax unless condition worsens.  Patient Instructions    Do not start Zithromax unless you get worse. Upper Respiratory Infection, Adult Most upper respiratory infections (URIs) are caused by a virus. A URI affects the nose, throat, and upper air passages. The most common type of URI is often called "the common cold." Follow these instructions at home:  Take medicines only as told by your doctor.  Gargle warm saltwater or take cough drops to comfort your throat as told by your doctor.  Use a warm mist humidifier or inhale steam from a shower to increase air moisture. This may make it easier to breathe.  Drink enough fluid to keep your pee (urine) clear or pale yellow.  Eat soups and other clear broths.  Have a healthy diet.  Rest as needed.  Go back to work when your fever is gone or your doctor says it is okay. ? You may need to stay home longer to avoid giving your URI to others. ? You can also wear a face mask and wash your hands often to prevent spread of the virus.  Use your inhaler more if you have asthma.  Do not use any tobacco  products, including cigarettes, chewing tobacco, or electronic cigarettes. If you need help quitting, ask your doctor. Contact a doctor if:  You are getting worse, not better.  Your symptoms are not helped by medicine.  You have chills.  You are getting more short of breath.  You have brown or red mucus.  You have yellow or brown discharge from your nose.  You have pain in your face, especially when you bend forward.  You have a fever.  You have puffy (swollen) neck glands.  You have pain while swallowing.  You have white areas in the back of your throat. Get help right away if:  You have very bad or constant: ? Headache. ? Ear pain. ? Pain in your forehead, behind your eyes, and over your cheekbones (sinus pain). ? Chest pain.  You have long-lasting (chronic) lung disease and any of the following: ? Wheezing. ? Long-lasting cough. ? Coughing up blood. ? A change in your usual mucus.  You have a stiff neck.  You have changes in your: ? Vision. ? Hearing. ? Thinking. ? Mood. This information is not intended to replace advice given to you by your health care provider. Make sure you discuss any questions you have with your health care provider. Document Released: 02/03/2008 Document Revised: 04/19/2016 Document Reviewed: 11/22/2013 Elsevier Interactive Patient Education  2018 [employer]    IF you received an x-ray today, you will receive an invoice from [facility]. Please contact [facility] at [phone] with questions or concerns regarding your invoice.   IF you received labwork today, you will receive an invoice from [facility]. Please contact [facility] at [phone] with questions or concerns regarding your invoice.   Our billing staff will not be able to assist you with questions regarding bills from these companies.  You will be contacted with the lab results as soon as they are available. The fastest way to get your results is to  activate your My Chart account. Instructions are located on the last page of this paperwork. If you have not heard from us regarding the results in 2 weeks, please contact this office.         Edwina BarthMiguel Kjersti Dittmer, MD [facility]

## 2017-07-30 ENCOUNTER — Ambulatory Visit (INDEPENDENT_AMBULATORY_CARE_PROVIDER_SITE_OTHER): Payer: BLUE CROSS/BLUE SHIELD

## 2017-07-30 DIAGNOSIS — L500 Allergic urticaria: Secondary | ICD-10-CM

## 2017-08-12 ENCOUNTER — Ambulatory Visit (INDEPENDENT_AMBULATORY_CARE_PROVIDER_SITE_OTHER): Payer: BLUE CROSS/BLUE SHIELD | Admitting: Family Medicine

## 2017-08-12 ENCOUNTER — Encounter: Payer: Self-pay | Admitting: Family Medicine

## 2017-08-12 ENCOUNTER — Other Ambulatory Visit: Payer: Self-pay

## 2017-08-12 VITALS — BP 124/96 | HR 71 | Temp 97.7°F | Wt 244.0 lb

## 2017-08-12 DIAGNOSIS — S93402A Sprain of unspecified ligament of left ankle, initial encounter: Secondary | ICD-10-CM

## 2017-08-12 DIAGNOSIS — M25572 Pain in left ankle and joints of left foot: Secondary | ICD-10-CM

## 2017-08-12 NOTE — Patient Instructions (Addendum)
   IF you received an x-ray today, you will receive an invoice from [facility]. Please contact [facility] at [phone] with questions or concerns regarding your invoice.   IF you received labwork today, you will receive an invoice from [facility]. Please contact [facility] at [phone] with questions or concerns regarding your invoice.   Our billing staff will not be able to assist you with questions regarding bills from these companies.  You will be contacted with the lab results as soon as they are available. The fastest way to get your results is to activate your My Chart account. Instructions are located on the last page of this paperwork. If you have not heard from us regarding the results in 2 weeks, please contact this office.      Ankle Sprain, Phase I Rehab Ask your health care provider which exercises are safe for you. Do exercises exactly as told by your health care provider and adjust them as directed. It is normal to feel mild stretching, pulling, tightness, or discomfort as you do these exercises, but you should stop right away if you feel sudden pain or your pain gets worse.Do not begin these exercises until told by your health care provider. Stretching and range of motion exercises These exercises warm up your muscles and joints and improve the movement and flexibility of your lower leg and ankle. These exercises also help to relieve pain and stiffness. Exercise A: Gastroc and soleus stretch  1. Sit on the floor with your left / right leg extended. 2. Loop a belt or towel around the ball of your left / right foot. The ball of your foot is on the walking surface, right under your toes. 3. Keep your left / right ankle and foot relaxed and keep your knee straight while you use the belt or towel to pull your foot toward you. You should feel a gentle stretch behind your calf or knee. 4. Hold this position for __________ seconds, then release to the starting  position. Repeat the exercise with your knee bent. You can put a pillow or a rolled bath towel under your knee to support it. You should feel a stretch deep in your calf or at your Achilles tendon. Repeat each stretch __________ times. Complete these stretches __________ times a day. Exercise B: Ankle alphabet  1. Sit with your left / right leg supported at the lower leg. ? Do not rest your foot on anything. ? Make sure your foot has room to move freely. 2. Think of your left / right foot as a paintbrush, and move your foot to trace each letter of the alphabet in the air. Keep your hip and knee still while you trace. Make the letters as large as you can without feeling discomfort. 3. Trace every letter from A to Z. Repeat __________ times. Complete this exercise __________ times a day. Strengthening exercises These exercises build strength and endurance in your ankle and lower leg. Endurance is the ability to use your muscles for a long time, even after they get tired. Exercise C: Dorsiflexors  1. Secure a rubber exercise band or tube to an object, such as a table leg, that will stay still when the band is pulled. Secure the other end around your left / right foot. 2. Sit on the floor facing the object, with your left / right leg extended. The band or tube should be slightly tense when your foot is relaxed. 3. Slowly bring your foot toward you, pulling   the band tighter. 4. Hold this position for __________ seconds. 5. Slowly return your foot to the starting position. Repeat __________ times. Complete this exercise __________ times a day. Exercise D: Plantar flexors  1. Sit on the floor with your left / right leg extended. 2. Loop a rubber exercise tube or band around the ball of your left / right foot. The ball of your foot is on the walking surface, right under your toes. ? Hold the ends of the band or tube in your hands. ? The band or tube should be slightly tense when your foot is  relaxed. 3. Slowly point your foot and toes downward, pushing them away from you. 4. Hold this position for __________ seconds. 5. Slowly return your foot to the starting position. Repeat __________ times. Complete this exercise __________ times a day. Exercise E: Evertors 1. Sit on the floor with your legs straight out in front of you. 2. Loop a rubber exercise band or tube around the ball of your left / right foot. The ball of your foot is on the walking surface, right under your toes. ? Hold the ends of the band in your hands, or secure the band to a stable object. ? The band or tube should be slightly tense when your foot is relaxed. 3. Slowly push your foot outward, away from your other leg. 4. Hold this position for __________ seconds. 5. Slowly return your foot to the starting position. Repeat __________ times. Complete this exercise __________ times a day. This information is not intended to replace advice given to you by your health care provider. Make sure you discuss any questions you have with your health care provider. Document Released: 03/18/2005 Document Revised: 04/23/2016 Document Reviewed: 07/01/2015 Elsevier Interactive Patient Education  2018 [employer]vier Inc.  

## 2017-08-12 NOTE — Progress Notes (Signed)
12/13/20186:29 PM  Buford DresserClyde B Routon 1966-06-18, 51 y.o. male [id number]  Chief Complaint  Patient presents with  . Gout    pain in lft foot    HPI:   Patient is a 51 y.o. male who presents today for 3 days of left ankle pain, worried it is gout. Reports pain along medial aspect, denies any trauma, but was wearing some very uncomfortable shoes the day when this started. He denies any joint swelling, redness or warmth. Has not been taking, doing anything for it. Has a remote h/o gout of 1st MTP.  [facility] 08/12/2017 07/12/2017 06/03/2017  Decreased Interest 0 0 0  Down, Depressed, Hopeless 0 0 0  PHQ - 2 Score 0 0 0    Allergies  Allergen Reactions  . Pumpkin Seed Hives and Swelling    Swelling of throat, tongue and lips. Swelling of throat, tongue and lips.    Prior to Admission medications   Medication Sig Start Date End Date Taking? Authorizing Provider  azithromycin (ZITHROMAX) 250 MG tablet Sig as indicated 07/12/17  Yes Sagardia, Eilleen KempfMiguel Jose, MD  cholecalciferol (VITAMIN D) 1000 UNITS tablet Take 1,000 Units by mouth daily.   Yes [provider]  Cyanocobalamin ER (B-12 DUAL SPECTRUM) 5000 MCG TBCR  09/01/15  Yes [provider]  diclofenac (VOLTAREN) 75 MG EC tablet TAKE 1 TABLET (75 MG TOTAL) BY MOUTH 2 (TWO) TIMES DAILY AS NEEDED. Patient taking differently: Take 75 mg by mouth 2 (two) times daily as needed for mild pain or moderate pain.  01/24/16  Yes Collene Gobbleaub, Steven A, MD  DOCOSAHEXAENOIC ACID PO  05/01/16  Yes [provider]  EPINEPHrine (AUVI-Q) 0.3 mg/0.3 mL IJ SOAJ injection Use as directed for severe allergic reaction 03/17/17  Yes Alfonse SpruceGallagher, Joel Louis, MD  HYDROcodone-acetaminophen [facility](NORCO) 5-325 MG tablet Take 1-2 tablets by mouth every 6 (six) hours as needed. 06/18/16  Yes Bing NeighborsHarris, Kimberly S, FNP  loratadine (CLARITIN) 10 MG tablet Take by mouth. 12/16/10  Yes [provider]  Multiple Vitamin (MULTIVITAMIN WITH  MINERALS) TABS Take 1 tablet by mouth daily.   Yes [provider]  ondansetron (ZOFRAN) 4 MG tablet Take 1 tablet (4 mg total) by mouth every 6 (six) hours as needed for nausea or vomiting. 11/15/16  Yes Dione BoozeGlick, David, MD  oxyCODONE-acetaminophen (PERCOCET) 5-325 MG tablet Take 1 tablet by mouth every 4 (four) hours as needed for moderate pain. 11/15/16  Yes Dione BoozeGlick, David, MD  ranitidine (ZANTAC) 150 MG tablet Take by mouth. 12/16/10  Yes [provider]  tamsulosin (FLOMAX) 0.4 MG CAPS capsule Take 1 tablet daily as needed for symptoms of kidney stone 11/15/16  Yes Dione BoozeGlick, David, MD  Glucosamine HCl 1500 MG TABS Take by mouth. 12/16/10   [provider]  lisinopril (PRINIVIL,ZESTRIL) 10 MG tablet Take 1 tablet (10 mg total) by mouth daily. Patient not taking: Reported on 08/12/2017 06/03/17   [name], Jeffrey R, MD    Past Medical History:  Diagnosis Date  . Allergy   . Arthritis    right knee  . GERD (gastroesophageal reflux disease)   . History of kidney stones    multople stones   . Hypertension   . Kidney stone   . Sleep apnea     Past Surgical History:  Procedure Laterality Date  . EXTRACORPOREAL SHOCK WAVE LITHOTRIPSY Right 11/05/2016   Procedure: RIGHT EXTRACORPOREAL SHOCK WAVE LITHOTRIPSY (ESWL);  Surgeon: Malen GauzePatrick L McKenzie, MD;  Location: [facility];  Service: Urology;  Laterality: Right;  . GANGLION CYST EXCISION  1989  . LAPAROSCOPIC GASTRIC BANDING    . NEVUS EXCISION Right 04/28/2017  . SPINE SURGERY      Social History   Tobacco Use  . Smoking status: Never Smoker  . Smokeless tobacco: Never Used  Substance Use Topics  . Alcohol use: No    Family History  Problem Relation Age of Onset  . Hypertension Mother   . Diabetes Father   . Cancer Maternal Grandmother   . Depression Maternal Grandfather   . Alzheimer's disease Paternal Grandmother   . Cancer Paternal Grandfather     ROS Per hpi  OBJECTIVE:  Blood pressure (!) 124/96, pulse  71, temperature 97.7 F (36.5 C), temperature source Oral, weight 244 lb (110.7 kg), SpO2 96 %.  Physical Exam  Gen: AAOx3, NAD MSK: left ankle, no swelling redness or warmth, FROM, TTP along medial calcaneal ligaments. Left foot normal. Neurovascularly intact.    ASSESSMENT and PLAN  1. Sprain of left ankle, unspecified ligament, initial encounter 2. Pain of joint of left ankle and foot  Discussed RICE therapy, Patient placed in strap up ankle brace with significant improvement in pain. Patient educational handout with exercises given. RTC precautions discussed.   Return if symptoms worsen or fail to improve.    Myles LippsIrma M Santiago, MD [facility] at [facility] [address] [place], [place] [address] Ph.  [phone] Fax [phone]

## 2017-08-13 ENCOUNTER — Other Ambulatory Visit: Payer: Self-pay | Admitting: Family Medicine

## 2017-08-16 ENCOUNTER — Ambulatory Visit (INDEPENDENT_AMBULATORY_CARE_PROVIDER_SITE_OTHER): Payer: BLUE CROSS/BLUE SHIELD | Admitting: Physician Assistant

## 2017-08-16 ENCOUNTER — Encounter: Payer: Self-pay | Admitting: Physician Assistant

## 2017-08-16 ENCOUNTER — Other Ambulatory Visit: Payer: Self-pay

## 2017-08-16 VITALS — BP 142/86 | HR 76 | Resp 16 | Ht 72.0 in | Wt 244.0 lb

## 2017-08-16 DIAGNOSIS — M79672 Pain in left foot: Secondary | ICD-10-CM | POA: Diagnosis not present

## 2017-08-16 MED ORDER — DICLOFENAC SODIUM 75 MG PO TBEC: 75.0000 mg | DELAYED_RELEASE_TABLET | Freq: Two times a day (BID) | ORAL | 0 refills | Status: DC

## 2017-08-16 MED ORDER — COLCHICINE 0.6 MG PO TABS: ORAL_TABLET | ORAL | 1 refills | Status: AC

## 2017-08-16 NOTE — Patient Instructions (Signed)
     IF you received an x-ray today, you will receive an invoice from [facility]. Please contact [facility] at [phone] with questions or concerns regarding your invoice.   IF you received labwork today, you will receive an invoice from [facility]. Please contact [facility] at [phone] with questions or concerns regarding your invoice.   Our billing staff will not be able to assist you with questions regarding bills from these companies.  You will be contacted with the lab results as soon as they are available. The fastest way to get your results is to activate your My Chart account. Instructions are located on the last page of this paperwork. If you have not heard from us regarding the results in 2 weeks, please contact this office.     

## 2017-08-16 NOTE — Progress Notes (Signed)
08/16/2017 4:42 PM   DOB: 10-16-1965 / MRN: [id number]  SUBJECTIVE:  Juan Nelson is a 51 y.o. male presenting for follow up of a heel pain that he thinks was a gout flare. He tells me that he presented here with intractable heel pain and was actually crawling in his apparment.  He was fitted with a brace which helped some but not greatly. He found some diclofenac at home which he used to take for gout and his pain was practically gone within about 1 hour.  He took another dose the next day and tells me that the pain has now resolved. Has a history of GERD but this was associates with morbid obesity, which has resolved.     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   He  has a past medical history of Allergy, Arthritis, GERD (gastroesophageal reflux disease), History of kidney stones, Hypertension, Kidney stone, and Sleep apnea.    He  reports that  has never smoked. he has never used smokeless tobacco. He reports that he does not drink alcohol or use drugs. He  reports that he currently engages in sexual activity. The patient  has a past surgical history that includes Spine surgery; Laparoscopic gastric banding; Ganglion cyst excision (1989); Extracorporeal shock wave lithotripsy (Right, 11/05/2016); and Nevus excision (Right, 04/28/2017).  His family history includes Alzheimer's disease in his paternal grandmother; Cancer in his maternal grandmother and paternal grandfather; Depression in his maternal grandfather; Diabetes in his father; Hypertension in his mother.  Review of Systems  Constitutional: Negative for chills, diaphoresis and fever.  Eyes: Negative.   Respiratory: Negative for cough, hemoptysis, sputum production, shortness of breath and wheezing.   Cardiovascular: Negative for chest pain, orthopnea and leg swelling.  Gastrointestinal: Negative for nausea.  Skin: Negative for rash.  Neurological: Negative for dizziness, sensory change, speech change, focal weakness and headaches.     The problem list and medications were reviewed and updated by myself where necessary and exist elsewhere in the encounter.   OBJECTIVE:  BP (!) 142/86 (BP Location: Right Arm, Patient Position: Sitting, Cuff Size: Normal)   Pulse 76   Resp 16   Ht 6' (1.829 m)   Wt 244 lb (110.7 kg)   SpO2 97%   BMI 33.09 kg/m   Physical Exam  Constitutional: He appears well-developed. He is active and cooperative.  Non-toxic appearance.  Cardiovascular: Normal rate, S1 normal, S2 normal and normal pulses. Exam reveals no gallop and no friction rub.  No murmur heard. Pulmonary/Chest: Effort normal. No stridor. No tachypnea. No respiratory distress.  Abdominal: He exhibits no distension.  Musculoskeletal: He exhibits no edema, tenderness or deformity.  Neurological: He is alert.  Skin: Skin is warm and dry. He is not diaphoretic. No pallor.  Vitals reviewed.   No results found for this or any previous visit (from the past 72 hour(s)).  No results found.  ASSESSMENT AND PLAN:  Juan Nelson was seen today for gout.  Diagnoses and all orders for this visit:  Inflammatory pain of left heel: He makes a compelling case for gout.  I will refill his diclofenac as requested but advised that a trial of successful trial colchicine would further strengthen his suspicious.    -     colchicine 0.6 MG tablet; Take two tabs once at the first sign of a gout flare and then one an hour later. Repeat in 24 hours if needed. -     diclofenac (VOLTAREN) 75 MG EC tablet;  Take 1 tablet (75 mg total) by mouth 2 (two) times daily.    The patient is advised to call or return to clinic if he does not see an improvement in symptoms, or to seek the care of the closest emergency department if he worsens with the above plan.   Deliah BostonMichael Clark, MHS, PA-C Primary Care at [facility] 08/16/2017 4:42 PM

## 2017-08-17 ENCOUNTER — Encounter: Payer: Self-pay | Admitting: Family Medicine

## 2017-08-17 DIAGNOSIS — E669 Obesity, unspecified: Secondary | ICD-10-CM | POA: Diagnosis not present

## 2017-08-17 DIAGNOSIS — Z9884 Bariatric surgery status: Secondary | ICD-10-CM | POA: Diagnosis not present

## 2017-08-17 DIAGNOSIS — Z6830 Body mass index (BMI) 30.0-30.9, adult: Secondary | ICD-10-CM | POA: Diagnosis not present

## 2017-08-17 DIAGNOSIS — F341 Dysthymic disorder: Secondary | ICD-10-CM | POA: Diagnosis not present

## 2017-08-17 DIAGNOSIS — N6019 Diffuse cystic mastopathy of unspecified breast: Secondary | ICD-10-CM | POA: Diagnosis not present

## 2017-08-17 DIAGNOSIS — Z4651 Encounter for fitting and adjustment of gastric lap band: Secondary | ICD-10-CM | POA: Diagnosis not present

## 2017-08-17 DIAGNOSIS — M1710 Unilateral primary osteoarthritis, unspecified knee: Secondary | ICD-10-CM | POA: Diagnosis not present

## 2017-08-17 DIAGNOSIS — I10 Essential (primary) hypertension: Secondary | ICD-10-CM | POA: Diagnosis not present

## 2017-08-27 ENCOUNTER — Other Ambulatory Visit: Payer: Self-pay | Admitting: Physician Assistant

## 2017-08-27 DIAGNOSIS — M79672 Pain in left foot: Secondary | ICD-10-CM

## 2017-08-27 NOTE — Telephone Encounter (Signed)
Pt requesting refill of Voltaren  LOV 08/16/17 with Deliah BostonMichael Clark

## 2017-08-30 ENCOUNTER — Ambulatory Visit (INDEPENDENT_AMBULATORY_CARE_PROVIDER_SITE_OTHER): Payer: BLUE CROSS/BLUE SHIELD

## 2017-08-30 DIAGNOSIS — L500 Allergic urticaria: Secondary | ICD-10-CM | POA: Diagnosis not present

## 2017-09-18 ENCOUNTER — Other Ambulatory Visit: Payer: Self-pay | Admitting: Physician Assistant

## 2017-09-18 DIAGNOSIS — M79672 Pain in left foot: Secondary | ICD-10-CM

## 2017-09-20 NOTE — Telephone Encounter (Signed)
Does patient need an appointment?

## 2017-09-20 NOTE — Telephone Encounter (Signed)
See refill request for Diclofenac; please review with Juan BostonMichael Clark; in reviewing the office note on 08/16/17, he was given only 30 day supply with no refills.

## 2017-09-30 ENCOUNTER — Ambulatory Visit (INDEPENDENT_AMBULATORY_CARE_PROVIDER_SITE_OTHER): Payer: BLUE CROSS/BLUE SHIELD

## 2017-09-30 DIAGNOSIS — L500 Allergic urticaria: Secondary | ICD-10-CM | POA: Diagnosis not present

## 2017-10-07 ENCOUNTER — Other Ambulatory Visit: Payer: Self-pay | Admitting: Physician Assistant

## 2017-10-07 DIAGNOSIS — M79672 Pain in left foot: Secondary | ICD-10-CM

## 2017-10-22 ENCOUNTER — Other Ambulatory Visit: Payer: Self-pay | Admitting: Physician Assistant

## 2017-10-22 DIAGNOSIS — M79672 Pain in left foot: Secondary | ICD-10-CM

## 2017-10-28 ENCOUNTER — Ambulatory Visit (INDEPENDENT_AMBULATORY_CARE_PROVIDER_SITE_OTHER): Payer: BLUE CROSS/BLUE SHIELD | Admitting: *Deleted

## 2017-10-28 DIAGNOSIS — L500 Allergic urticaria: Secondary | ICD-10-CM

## 2017-11-23 ENCOUNTER — Other Ambulatory Visit: Payer: Self-pay | Admitting: Physician Assistant

## 2017-11-23 DIAGNOSIS — M79672 Pain in left foot: Secondary | ICD-10-CM

## 2017-11-23 NOTE — Telephone Encounter (Signed)
Please

## 2017-11-24 NOTE — Telephone Encounter (Signed)
Refill of Voltaren  LOV 08/16/17  M. Clark   [id number] [place] - [address]

## 2017-11-25 ENCOUNTER — Ambulatory Visit (INDEPENDENT_AMBULATORY_CARE_PROVIDER_SITE_OTHER): Payer: BLUE CROSS/BLUE SHIELD | Admitting: *Deleted

## 2017-11-25 DIAGNOSIS — L500 Allergic urticaria: Secondary | ICD-10-CM | POA: Diagnosis not present

## 2017-11-25 NOTE — Telephone Encounter (Signed)
Patient is requesting a refill of the following medications: Requested Prescriptions   Pending Prescriptions Disp Refills  . diclofenac (VOLTAREN) 75 MG EC tablet [Pharmacy Med Name: DICLOFENAC SOD EC 75 MG TAB] 30 tablet 0    Sig: TAKE 1 TABLET BY MOUTH TWICE A DAY    Date of patient request: 11/23/17  Last office visit: 08/16/17 Date of last refill: 10/25/17 Last refill amount: 30 Follow up time period per chart: n/a

## 2017-12-21 ENCOUNTER — Ambulatory Visit (INDEPENDENT_AMBULATORY_CARE_PROVIDER_SITE_OTHER): Payer: BLUE CROSS/BLUE SHIELD | Admitting: *Deleted

## 2017-12-21 DIAGNOSIS — L500 Allergic urticaria: Secondary | ICD-10-CM

## 2017-12-23 DIAGNOSIS — N200 Calculus of kidney: Secondary | ICD-10-CM | POA: Diagnosis not present

## 2017-12-25 ENCOUNTER — Other Ambulatory Visit: Payer: Self-pay | Admitting: Physician Assistant

## 2017-12-25 DIAGNOSIS — M79672 Pain in left foot: Secondary | ICD-10-CM

## 2018-01-06 ENCOUNTER — Ambulatory Visit (INDEPENDENT_AMBULATORY_CARE_PROVIDER_SITE_OTHER): Payer: BLUE CROSS/BLUE SHIELD | Admitting: Family Medicine

## 2018-01-06 ENCOUNTER — Encounter: Payer: Self-pay | Admitting: Family Medicine

## 2018-01-06 VITALS — BP 132/84 | HR 69 | Temp 98.2°F | Ht 73.0 in | Wt 248.2 lb

## 2018-01-06 DIAGNOSIS — D649 Anemia, unspecified: Secondary | ICD-10-CM

## 2018-01-06 DIAGNOSIS — H9202 Otalgia, left ear: Secondary | ICD-10-CM | POA: Diagnosis not present

## 2018-01-06 DIAGNOSIS — H6123 Impacted cerumen, bilateral: Secondary | ICD-10-CM

## 2018-01-06 DIAGNOSIS — I10 Essential (primary) hypertension: Secondary | ICD-10-CM

## 2018-01-06 MED ORDER — LISINOPRIL 10 MG PO TABS: 10.0000 mg | ORAL_TABLET | Freq: Every day | ORAL | 1 refills | Status: DC

## 2018-01-06 NOTE — Patient Instructions (Addendum)
Some of your symptoms may be related to eustachian tube. Try flonase at bedtime as we discussed. If not improving in next week or worse sooner- return for recheck.   Unfortunately we were not able to remove all the cerumen today. I do think it is important to see that left eardrum.  We will place a few drops of colace in the ears today and return tomorrow morning for repeat attempt at lavage. If that is not successful, we can have you evaluated by ear nose and throat.   Return to the clinic or go to the nearest emergency room if any of your symptoms worsen or new symptoms occur.    Earwax Buildup, Adult The ears produce a substance called earwax that helps keep bacteria out of the ear and protects the skin in the ear canal. Occasionally, earwax can build up in the ear and cause discomfort or hearing loss. What increases the risk? This condition is more likely to develop in people who:  Are male.  Are elderly.  Naturally produce more earwax.  Clean their ears often with cotton swabs.  Use earplugs often.  Use in-ear headphones often.  Wear hearing aids.  Have narrow ear canals.  Have earwax that is overly thick or sticky.  Have eczema.  Are dehydrated.  Have excess hair in the ear canal.  What are the signs or symptoms? Symptoms of this condition include:  Reduced or muffled hearing.  A feeling of fullness in the ear or feeling that the ear is plugged.  Fluid coming from the ear.  Ear pain.  Ear itch.  Ringing in the ear.  Coughing.  An obvious piece of earwax that can be seen inside the ear canal.  How is this diagnosed? This condition may be diagnosed based on:  Your symptoms.  Your medical history.  An ear exam. During the exam, your health care provider will look into your ear with an instrument called an otoscope.  You may have tests, including a hearing test. How is this treated? This condition may be treated by:  Using ear drops to soften  the earwax.  Having the earwax removed by a health care provider. The health care provider may: ? Flush the ear with water. ? Use an instrument that has a loop on the end (curette). ? Use a suction device.  Surgery to remove the wax buildup. This may be done in severe cases.  Follow these instructions at home:  Take over-the-counter and prescription medicines only as told by your health care provider.  Do not put any objects, including cotton swabs, into your ear. You can clean the opening of your ear canal with a washcloth or facial tissue.  Follow instructions from your health care provider about cleaning your ears. Do not over-clean your ears.  Drink enough fluid to keep your urine clear or pale yellow. This will help to thin the earwax.  Keep all follow-up visits as told by your health care provider. If earwax builds up in your ears often or if you use hearing aids, consider seeing your health care provider for routine, preventive ear cleanings. Ask your health care provider how often you should schedule your cleanings.  If you have hearing aids, clean them according to instructions from the manufacturer and your health care provider. Contact a health care provider if:  You have ear pain.  You develop a fever.  You have blood, pus, or other fluid coming from your ear.  You have hearing loss.  You have ringing in your ears that does not go away.  Your symptoms do not improve with treatment.  You feel like the room is spinning (vertigo). Summary  Earwax can build up in the ear and cause discomfort or hearing loss.  The most common symptoms of this condition include reduced or muffled hearing and a feeling of fullness in the ear or feeling that the ear is plugged.  This condition may be diagnosed based on your symptoms, your medical history, and an ear exam.  This condition may be treated by using ear drops to soften the earwax or by having the earwax removed by a health  care provider.  Do not put any objects, including cotton swabs, into your ear. You can clean the opening of your ear canal with a washcloth or facial tissue. This information is not intended to replace advice given to you by your health care provider. Make sure you discuss any questions you have with your health care provider. Document Released: 09/24/2004 Document Revised: 10/28/2016 Document Reviewed: 10/28/2016 Elsevier Interactive Patient Education  2018 [employer].   IF you received an x-ray today, you will receive an invoice from [facility]. Please contact [facility] at [phone] with questions or concerns regarding your invoice.   IF you received labwork today, you will receive an invoice from [facility]. Please contact [facility] at [phone] with questions or concerns regarding your invoice.   Our billing staff will not be able to assist you with questions regarding bills from these companies.  You will be contacted with the lab results as soon as they are available. The fastest way to get your results is to activate your My Chart account. Instructions are located on the last page of this paperwork. If you have not heard from [place] regarding the results in 2 weeks, please contact this office.

## 2018-01-06 NOTE — Progress Notes (Signed)
Subjective:  By signing my name below, I, [name], attest that this documentation has been prepared under the direction and in the presence of Shade Flood, MD Electronically Signed: Charline Bills, ED Scribe 01/06/2018 at 11:53 AM.   Patient ID: Juan Nelson, male    DOB: 1965-11-30, 52 y.o.   MRN: [id number]  Chief Complaint  Patient presents with  . ear blockage    both ears left ear more painful than right   HPI Juan Nelson is a 52 y.o. male who presents to Primary Care at [facility] for bilateral ear blockage for a while. Pt reports occasional clicking and L ear pain that radiates down into his neck for the past 2 wks, L worse than R. Denies fever, drainage from ears, decreased hearing, nasal congestion. Pt is currently taking Xolair and follows with an allergist once a month.  Last seen for CPE in Oct 2018. Pt is fasting at this visit.  HTN BP Readings from Last 3 Encounters:  01/06/18 132/84  08/16/17 (!) 142/86  08/12/17 (!) 124/96   Lab Results  Component Value Date   CREATININE 0.89 12/01/2016  Lisinopril 10 mg qd. - Denies any new side-effects. He reports home BP readings of 130s-70s.  Obesity Has had lap band placement in 2009. Did have bands reset/adjusted last Oct and again last wk. Wt Readings from Last 3 Encounters:  01/06/18 248 lb 3.2 oz (112.6 kg)  08/16/17 244 lb (110.7 kg)  08/12/17 244 lb (110.7 kg)  Body mass index is 32.75 kg/m.   H/o Anemia Lab Results  Component Value Date   WBC 3.8 12/30/2016   HGB 12.4 (L) 12/30/2016   HCT 37.1 (L) 12/30/2016   MCV 88 12/30/2016   PLT 172 12/30/2016  Hgb had improved from 10.9 1 yr prior. - Denies lightheadedness, dizziness, blood in stools, melena.  Patient Active Problem List   Diagnosis Date Noted  . Other seasonal allergic rhinitis 03/17/2017  . History of kidney stones 04/18/2014  . Essential hypertension 04/18/2014  . LFT elevation 10/08/2012  . Hyperglycemia 10/08/2012    Past Medical History:  Diagnosis Date  . Allergy   . Arthritis    right knee  . GERD (gastroesophageal reflux disease)   . History of kidney stones    multople stones   . Hypertension   . Kidney stone   . Sleep apnea    Past Surgical History:  Procedure Laterality Date  . EXTRACORPOREAL SHOCK WAVE LITHOTRIPSY Right 11/05/2016   Procedure: RIGHT EXTRACORPOREAL SHOCK WAVE LITHOTRIPSY (ESWL);  Surgeon: Malen Gauze, MD;  Location: [facility];  Service: Urology;  Laterality: Right;  . GANGLION CYST EXCISION  1989  . LAPAROSCOPIC GASTRIC BANDING    . NEVUS EXCISION Right 04/28/2017  . SPINE SURGERY     Allergies  Allergen Reactions  . Pumpkin Seed Hives and Swelling    Swelling of throat, tongue and lips. Swelling of throat, tongue and lips. Swelling of throat, tongue and lips.   Prior to Admission medications   Medication Sig Start Date End Date Taking? Authorizing Provider  aspirin EC 81 MG tablet Take 81 mg by mouth daily.   Yes [provider]  azithromycin (ZITHROMAX) 250 MG tablet Sig as indicated 07/12/17  Yes Sagardia, Eilleen Kempf, MD  cholecalciferol (VITAMIN D) 1000 UNITS tablet Take 1,000 Units by mouth daily.   Yes [provider]  colchicine 0.6 MG tablet Take two tabs once at the first sign of  a gout flare and then one an hour later. Repeat in 24 hours if needed. 08/16/17  Yes Ofilia Neas, PA-C  Cyanocobalamin ER (B-12 DUAL SPECTRUM) 5000 MCG TBCR  09/01/15  Yes [provider]  diclofenac (VOLTAREN) 75 MG EC tablet TAKE 1 TABLET BY MOUTH TWICE A DAY 11/25/17  Yes Ofilia Neas, PA-C  EPINEPHrine (AUVI-Q) 0.3 mg/0.3 mL IJ SOAJ injection Use as directed for severe allergic reaction 03/17/17  Yes Alfonse Spruce, MD  HYDROcodone-acetaminophen (NORCO) 5-325 MG tablet Take 1-2 tablets by mouth every 6 (six) hours as needed. 06/18/16  Yes Bing Neighbors, FNP  lisinopril (PRINIVIL,ZESTRIL) 10 MG tablet Take 1 tablet (10 mg total)  by mouth daily. 06/03/17  Yes Shade Flood, MD  loratadine (CLARITIN) 10 MG tablet Take by mouth. 12/16/10  Yes [provider]  Multiple Vitamin (MULTIVITAMIN WITH MINERALS) TABS Take 1 tablet by mouth daily.   Yes [provider]  ondansetron (ZOFRAN) 4 MG tablet Take 1 tablet (4 mg total) by mouth every 6 (six) hours as needed for nausea or vomiting. 11/15/16  Yes Dione Booze, MD  oxyCODONE-acetaminophen (PERCOCET) 5-325 MG tablet Take 1 tablet by mouth every 4 (four) hours as needed for moderate pain. 11/15/16  Yes Dione Booze, MD  ranitidine (ZANTAC) 150 MG tablet Take by mouth. 12/16/10  Yes [provider]  tamsulosin (FLOMAX) 0.4 MG CAPS capsule Take 1 tablet daily as needed for symptoms of kidney stone 11/15/16  Yes Dione Booze, MD  DOCOSAHEXAENOIC ACID PO  05/01/16   [provider]   Social History   Socioeconomic History  . Marital status: Single    Spouse name: Not on file  . Number of children: Not on file  . Years of education: Not on file  . Highest education level: Not on file  Occupational History  . Not on file  Social Needs  . Financial resource strain: Not on file  . Food insecurity:    Worry: Not on file    Inability: Not on file  . Transportation needs:    Medical: Not on file    Non-medical: Not on file  Tobacco Use  . Smoking status: Never Smoker  . Smokeless tobacco: Never Used  Substance and Sexual Activity  . Alcohol use: No  . Drug use: No  . Sexual activity: Yes  Lifestyle  . Physical activity:    Days per week: Not on file    Minutes per session: Not on file  . Stress: Not on file  Relationships  . Social connections:    Talks on phone: Not on file    Gets together: Not on file    Attends religious service: Not on file    Active member of club or organization: Not on file    Attends meetings of clubs or organizations: Not on file    Relationship status: Not on file  . Intimate partner violence:    Fear  of current or ex partner: Not on file    Emotionally abused: Not on file    Physically abused: Not on file    Forced sexual activity: Not on file  Other Topics Concern  . Not on file  Social History Narrative  . Not on file   Review of Systems  Constitutional: Negative for fatigue, fever and unexpected weight change.  HENT: Positive for ear pain. Negative for congestion and ear discharge.   Eyes: Negative for visual disturbance.  Respiratory: Negative for cough, chest  tightness and shortness of breath.   Cardiovascular: Negative for chest pain, palpitations and leg swelling.  Gastrointestinal: Negative for abdominal pain and blood in stool.  Allergic/Immunologic: Positive for environmental allergies.  Neurological: Negative for dizziness, light-headedness and headaches.      Objective:   Physical Exam  Constitutional: He is oriented to person, place, and time. He appears well-developed and well-nourished. No distress.  HENT:  Head: Normocephalic and atraumatic.  L Ear: some cerumen in L ear, slightly indented. Small amt of TM is visualize. No erythema but unable to see the base. R Ear: obstructed ~90% with cerumen. Unable to visualize the base. No palpable tenderness along neck or jaw. Some click over TMJ, L greater than R.  Eyes: Conjunctivae and EOM are normal.  Neck: Neck supple. Carotid bruit is not present. No tracheal deviation present.  Cardiovascular: Normal rate.  Pulmonary/Chest: Effort normal. No respiratory distress.  Musculoskeletal: Normal range of motion.  Neurological: He is alert and oriented to person, place, and time.  Skin: Skin is warm and dry.  Psychiatric: He has a normal mood and affect. His behavior is normal.  Nursing note and vitals reviewed.  Vitals:   01/06/18 1105  BP: 132/84  Pulse: 69  Temp: 98.2 F (36.8 C)  TempSrc: Oral  SpO2: 95%  Weight: 248 lb 3.2 oz (112.6 kg)  Height:  (1.854 m)      Assessment & Plan:    Juan Nelson is a 52 y.o. male Anemia, unspecified type - Plan: CBC  - improved previously, repeat testing  Bilateral impacted cerumen - Plan: Ear wax removal Left ear pain  - may have component of eustachian tube dysfunction, but also with cerumen impaction.    -lavage attempted multiple times after colace bilaterally with partial relief. Plan to return in 1 day, repeat colace applied to soften cerumen  - trial of OTC flonase at bedtime for possible ETD. rtc precautions if worsening.   Essential hypertension - Plan: Basic metabolic panel, lisinopril (PRINIVIL,ZESTRIL) 10 MG tablet  - check BMP, stable control and tolerating lisinopril - continue same dose.   Meds ordered this encounter  Medications  . lisinopril (PRINIVIL,ZESTRIL) 10 MG tablet    Sig: Take 1 tablet (10 mg total) by mouth daily.    Dispense:  90 tablet    Refill:  1   Patient Instructions    Some of your symptoms may be related to eustachian tube. Try flonase at bedtime as we discussed. If not improving in next week or worse sooner- return for recheck.   Unfortunately we were not able to remove all the cerumen today. I do think it is important to see that left eardrum.  We will place a few drops of colace in the ears today and return tomorrow morning for repeat attempt at lavage. If that is not successful, we can have you evaluated by ear nose and throat.   Return to the clinic or go to the nearest emergency room if any of your symptoms worsen or new symptoms occur.    Earwax Buildup, Adult The ears produce a substance called earwax that helps keep bacteria out of the ear and protects the skin in the ear canal. Occasionally, earwax can build up in the ear and cause discomfort or hearing loss. What increases the risk? This condition is more likely to develop in people who:  Are male.  Are elderly.  Naturally produce more earwax.  Clean their ears often with cotton swabs.  Use earplugs often.  Use in-ear  headphones often.  Wear hearing aids.  Have narrow ear canals.  Have earwax that is overly thick or sticky.  Have eczema.  Are dehydrated.  Have excess hair in the ear canal.  What are the signs or symptoms? Symptoms of this condition include:  Reduced or muffled hearing.  A feeling of fullness in the ear or feeling that the ear is plugged.  Fluid coming from the ear.  Ear pain.  Ear itch.  Ringing in the ear.  Coughing.  An obvious piece of earwax that can be seen inside the ear canal.  How is this diagnosed? This condition may be diagnosed based on:  Your symptoms.  Your medical history.  An ear exam. During the exam, your health care provider will look into your ear with an instrument called an otoscope.  You may have tests, including a hearing test. How is this treated? This condition may be treated by:  Using ear drops to soften the earwax.  Having the earwax removed by a health care provider. The health care provider may: ? Flush the ear with water. ? Use an instrument that has a loop on the end (curette). ? Use a suction device.  Surgery to remove the wax buildup. This may be done in severe cases.  Follow these instructions at home:  Take over-the-counter and prescription medicines only as told by your health care provider.  Do not put any objects, including cotton swabs, into your ear. You can clean the opening of your ear canal with a washcloth or facial tissue.  Follow instructions from your health care provider about cleaning your ears. Do not over-clean your ears.  Drink enough fluid to keep your urine clear or pale yellow. This will help to thin the earwax.  Keep all follow-up visits as told by your health care provider. If earwax builds up in your ears often or if you use hearing aids, consider seeing your health care provider for routine, preventive ear cleanings. Ask your health care provider how often you should schedule your  cleanings.  If you have hearing aids, clean them according to instructions from the manufacturer and your health care provider. Contact a health care provider if:  You have ear pain.  You develop a fever.  You have blood, pus, or other fluid coming from your ear.  You have hearing loss.  You have ringing in your ears that does not go away.  Your symptoms do not improve with treatment.  You feel like the room is spinning (vertigo). Summary  Earwax can build up in the ear and cause discomfort or hearing loss.  The most common symptoms of this condition include reduced or muffled hearing and a feeling of fullness in the ear or feeling that the ear is plugged.  This condition may be diagnosed based on your symptoms, your medical history, and an ear exam.  This condition may be treated by using ear drops to soften the earwax or by having the earwax removed by a health care provider.  Do not put any objects, including cotton swabs, into your ear. You can clean the opening of your ear canal with a washcloth or facial tissue. This information is not intended to replace advice given to you by your health care provider. Make sure you discuss any questions you have with your health care provider. Document Released: 09/24/2004 Document Revised: 10/28/2016 Document Reviewed: 10/28/2016 Elsevier Interactive Patient Education  [employer].  IF you received an x-ray today, you will receive an invoice from [facility]. Please contact [facility] at [phone] with questions or concerns regarding your invoice.   IF you received labwork today, you will receive an invoice from [facility]. Please contact [facility] at [phone] with questions or concerns regarding your invoice.   Our billing staff will not be able to assist you with questions regarding bills from these companies.  You will be contacted with the lab results as soon as they are available. The fastest way  to get your results is to activate your My Chart account. Instructions are located on the last page of this paperwork. If you have not heard from [place] regarding the results in 2 weeks, please contact this office.       I personally performed the services described in this documentation, which was scribed in my presence. The recorded information has been reviewed and considered for accuracy and completeness, addended by me as needed, and agree with information above.  Signed,   Meredith Staggers, MD Primary Care at [facility].  01/08/18 8:42 AM

## 2018-01-07 ENCOUNTER — Ambulatory Visit (INDEPENDENT_AMBULATORY_CARE_PROVIDER_SITE_OTHER): Payer: BLUE CROSS/BLUE SHIELD | Admitting: Family Medicine

## 2018-01-07 VITALS — BP 130/98 | HR 69 | Temp 98.6°F | Resp 18 | Ht 73.0 in | Wt 248.0 lb

## 2018-01-07 DIAGNOSIS — H6982 Other specified disorders of Eustachian tube, left ear: Secondary | ICD-10-CM | POA: Diagnosis not present

## 2018-01-07 DIAGNOSIS — H6123 Impacted cerumen, bilateral: Secondary | ICD-10-CM | POA: Diagnosis not present

## 2018-01-07 DIAGNOSIS — H6992 Unspecified Eustachian tube disorder, left ear: Secondary | ICD-10-CM

## 2018-01-07 LAB — BASIC METABOLIC PANEL
BUN / CREAT RATIO: 11 (ref 9–20)
BUN: 12 mg/dL (ref 6–24)
CHLORIDE: 104 mmol/L (ref 96–106)
CO2: 21 mmol/L (ref 20–29)
Calcium: 9.4 mg/dL (ref 8.7–10.2)
Creatinine, Ser: 1.05 mg/dL (ref 0.76–1.27)
GFR calc non Af Amer: 81 mL/min/{1.73_m2} (ref >59)
GFR, EST AFRICAN AMERICAN: 93 mL/min/{1.73_m2} (ref >59)
GLUCOSE: 83 mg/dL (ref 65–99)
Potassium: 4.1 mmol/L (ref 3.5–5.2)
SODIUM: 142 mmol/L (ref 134–144)

## 2018-01-07 LAB — CBC
HEMATOCRIT: 40.7 % (ref 37.5–51.0)
Hemoglobin: 14.2 g/dL (ref 13.0–17.7)
MCH: 31.6 pg (ref 26.6–33.0)
MCHC: 34.9 g/dL (ref 31.5–35.7)
MCV: 90 fL (ref 79–97)
Platelets: 176 10*3/uL (ref 150–379)
RBC: 4.5 x10E6/uL (ref 4.14–5.80)
RDW: 14.2 % (ref 12.3–15.4)
WBC: 4.6 10*3/uL (ref 3.4–10.8)

## 2018-01-07 MED ORDER — TRIAMCINOLONE ACETONIDE 55 MCG/ACT NA AERO: 2.0000 | INHALATION_SPRAY | Freq: Every day | NASAL | 12 refills | Status: DC

## 2018-01-07 NOTE — Progress Notes (Addendum)
Subjective:  By signing my name below, I, [name], attest that this documentation has been prepared under the direction and in the presence of Norberto Sorenson, MD Electronically Signed: Charline Bills, ED Scribe 01/07/2018 at 8:51 AM.   Patient ID: Juan Nelson, male    DOB: [birth year], 52 y.o.   MRN: [id number]  Chief Complaint  Patient presents with  . Cerumen Impaction    both ears  . Follow-up   HPI Juan Nelson is a 52 y.o. male who presents to Primary Care at [facility] for f/u of bilateral cerumen impaction. Pt reports that the feeling of fullness and stuffiness has resolved. His ears are still popping some and he still has pain on the L. He is not using nasal sprays but is very open to it as his provider had suggested it yesterday.   Past Medical History:  Diagnosis Date  . Allergy   . Arthritis    right knee  . GERD (gastroesophageal reflux disease)   . History of kidney stones    multople stones   . Hypertension   . Kidney stone   . Sleep apnea    Current Outpatient Medications on File Prior to Visit  Medication Sig Dispense Refill  . aspirin EC 81 MG tablet Take 81 mg by mouth daily.    Marland Kitchen azithromycin (ZITHROMAX) 250 MG tablet Sig as indicated 6 tablet 0  . cholecalciferol (VITAMIN D) 1000 UNITS tablet Take 1,000 Units by mouth daily.    . colchicine 0.6 MG tablet Take two tabs once at the first sign of a gout flare and then one an hour later. Repeat in 24 hours if needed. 12 tablet 1  . Cyanocobalamin ER (B-12 DUAL SPECTRUM) 5000 MCG TBCR     . diclofenac (VOLTAREN) 75 MG EC tablet TAKE 1 TABLET BY MOUTH TWICE A DAY 30 tablet 0  . DOCOSAHEXAENOIC ACID PO     . EPINEPHrine (AUVI-Q) 0.3 mg/0.3 mL IJ SOAJ injection Use as directed for severe allergic reaction 2 Device 1  . HYDROcodone-acetaminophen (NORCO) 5-325 MG tablet Take 1-2 tablets by mouth every 6 (six) hours as needed. 30 tablet 0  . lisinopril (PRINIVIL,ZESTRIL) 10 MG tablet Take 1 tablet (10 mg  total) by mouth daily. 90 tablet 1  . loratadine (CLARITIN) 10 MG tablet Take by mouth.    . Multiple Vitamin (MULTIVITAMIN WITH MINERALS) TABS Take 1 tablet by mouth daily.    . ondansetron (ZOFRAN) 4 MG tablet Take 1 tablet (4 mg total) by mouth every 6 (six) hours as needed for nausea or vomiting. 12 tablet 0  . oxyCODONE-acetaminophen (PERCOCET) 5-325 MG tablet Take 1 tablet by mouth every 4 (four) hours as needed for moderate pain. 20 tablet 0  . ranitidine (ZANTAC) 150 MG tablet Take by mouth.    . tamsulosin (FLOMAX) 0.4 MG CAPS capsule Take 1 tablet daily as needed for symptoms of kidney stone 15 capsule 1   Current Facility-Administered Medications on File Prior to Visit  Medication Dose Route Frequency Provider Last Rate Last Dose  . omalizumab Geoffry Paradise) injection 300 mg  300 mg Subcutaneous Q28 days Marcelyn Bruins, MD   300 mg at 12/21/17 1832   Allergies  Allergen Reactions  . Pumpkin Seed Hives and Swelling    Swelling of throat, tongue and lips. Swelling of throat, tongue and lips. Swelling of throat, tongue and lips.   Review of Systems  HENT: Positive for ear pain.  Objective:   Physical Exam  Constitutional: He is oriented to person, place, and time. He appears well-developed and well-nourished. No distress.  HENT:  Head: Normocephalic and atraumatic.  Right Ear: A middle ear effusion (mild) is present.  Left Ear: Tympanic membrane is retracted. A middle ear effusion (mild) is present.  Eyes: Conjunctivae and EOM are normal.  Neck: Neck supple. No tracheal deviation present.  Cardiovascular: Normal rate.  Pulmonary/Chest: Effort normal. No respiratory distress.  Musculoskeletal: Normal range of motion.  Neurological: He is alert and oriented to person, place, and time.  Skin: Skin is warm and dry.  Psychiatric: He has a normal mood and affect. His behavior is normal.  Nursing note and vitals reviewed.  BP (!) 130/98 (BP Location: Left Arm,  Patient Position: Sitting, Cuff Size: Normal)   Pulse 69   Resp 18   Ht  (1.854 m)   Wt 248 lb (112.5 kg)   SpO2 95%   BMI 32.72 kg/m     Assessment & Plan:   1. Eustachian tube dysfunction, left   2. Bilateral impacted cerumen   Hearing, popping, stuffiness improved after B lavage but still w/ Left ear pain unchanged.  Exam appears relatively nml but do see some signs of ETD with retraction and possible (very subtle) mid-ear effusion - start nasal steroid and reviewed other techniques - steam, straws, hard candy, gym, allergy meds to open up left eustachian tube. RTC if sxs cont or worsen.  Meds ordered this encounter  Medications  . triamcinolone (NASACORT) 55 MCG/ACT AERO nasal inhaler    Sig: Place 2 sprays into the nose daily. Into each nare    Dispense:  1 Inhaler    Refill:  12    I personally performed the services described in this documentation, which was scribed in my presence. The recorded information has been reviewed and considered, and addended by me as needed.   Norberto Sorenson, M.D.  Primary Care at [facility] [address] [place], [place] [address] [phone] phone [phone] fax  01/28/18 6:15 AM

## 2018-01-07 NOTE — Patient Instructions (Signed)
Eustachian Tube Dysfunction The eustachian tube connects the middle ear to the back of the nose. It regulates air pressure in the middle ear by allowing air to move between the ear and nose. It also helps to drain fluid from the middle ear space. When the eustachian tube does not function properly, air pressure, fluid, or both can build up in the middle ear. Eustachian tube dysfunction can affect one or both ears. What are the causes? This condition happens when the eustachian tube becomes blocked or cannot open normally. This may result from:  Ear infections.  Colds and other upper respiratory infections.  Allergies.  Irritation, such as from cigarette smoke or acid from the stomach coming up into the esophagus (gastroesophageal reflux).  Sudden changes in air pressure, such as from descending in an airplane.  Abnormal growths in the nose or throat, such as nasal polyps, tumors, or enlarged tissue at the back of the throat (adenoids).  What increases the risk? This condition may be more likely to develop in people who smoke and people who are overweight. Eustachian tube dysfunction may also be more likely to develop in children, especially children who have:  Certain birth defects of the mouth, such as cleft palate.  Large tonsils and adenoids.  What are the signs or symptoms? Symptoms of this condition may include:  A feeling of fullness in the ear.  Ear pain.  Clicking or popping noises in the ear.  Ringing in the ear.  Hearing loss.  Loss of balance.  Symptoms may get worse when the air pressure around you changes, such as when you travel to an area of high elevation or fly on an airplane. How is this diagnosed? This condition may be diagnosed based on:  Your symptoms.  A physical exam of your ear, nose, and throat.  Tests, such as those that measure: ? The movement of your eardrum (tympanogram). ? Your hearing (audiometry).  How is this treated? Treatment  depends on the cause and severity of your condition. If your symptoms are mild, you may be able to relieve your symptoms by moving air into ("popping") your ears. If you have symptoms of fluid in your ears, treatment may include:  Decongestants.  Antihistamines.  Nasal sprays or ear drops that contain medicines that reduce swelling (steroids).  In some cases, you may need to have a procedure to drain the fluid in your eardrum (myringotomy). In this procedure, a small tube is placed in the eardrum to:  Drain the fluid.  Restore the air in the middle ear space.  Follow these instructions at home:  Take over-the-counter and prescription medicines only as told by your health care provider.  Use techniques to help pop your ears as recommended by your health care provider. These may include: ? Chewing gum. ? Yawning. ? Frequent, forceful swallowing. ? Closing your mouth, holding your nose closed, and gently blowing as if you are trying to blow air out of your nose.  Do not do any of the following until your health care provider approves: ? Travel to high altitudes. ? Fly in airplanes. ? Work in a pressurized cabin or room. ? Scuba dive.  Keep your ears dry. Dry your ears completely after showering or bathing.  Do not smoke.  Keep all follow-up visits as told by your health care provider. This is important. Contact a health care provider if:  Your symptoms do not go away after treatment.  Your symptoms come back after treatment.  You are   unable to pop your ears.  You have: ? A fever. ? Pain in your ear. ? Pain in your head or neck. ? Fluid draining from your ear.  Your hearing suddenly changes.  You become very dizzy.  You lose your balance. This information is not intended to replace advice given to you by your health care provider. Make sure you discuss any questions you have with your health care provider. Document Released: 09/13/2015 Document Revised: 01/23/2016  Document Reviewed: 09/05/2014 Elsevier Interactive Patient Education  2018 [employer]  

## 2018-01-08 ENCOUNTER — Encounter: Payer: Self-pay | Admitting: Family Medicine

## 2018-01-18 ENCOUNTER — Ambulatory Visit (INDEPENDENT_AMBULATORY_CARE_PROVIDER_SITE_OTHER): Payer: BLUE CROSS/BLUE SHIELD | Admitting: *Deleted

## 2018-01-18 DIAGNOSIS — L500 Allergic urticaria: Secondary | ICD-10-CM | POA: Diagnosis not present

## 2018-01-30 ENCOUNTER — Other Ambulatory Visit: Payer: Self-pay | Admitting: Physician Assistant

## 2018-01-30 DIAGNOSIS — M79672 Pain in left foot: Secondary | ICD-10-CM

## 2018-02-15 ENCOUNTER — Ambulatory Visit (INDEPENDENT_AMBULATORY_CARE_PROVIDER_SITE_OTHER): Payer: BLUE CROSS/BLUE SHIELD | Admitting: *Deleted

## 2018-02-15 DIAGNOSIS — L500 Allergic urticaria: Secondary | ICD-10-CM | POA: Diagnosis not present

## 2018-03-01 ENCOUNTER — Other Ambulatory Visit: Payer: Self-pay | Admitting: Physician Assistant

## 2018-03-01 DIAGNOSIS — M79672 Pain in left foot: Secondary | ICD-10-CM

## 2018-03-12 ENCOUNTER — Other Ambulatory Visit: Payer: Self-pay | Admitting: Family Medicine

## 2018-03-12 DIAGNOSIS — M79672 Pain in left foot: Secondary | ICD-10-CM

## 2018-03-15 ENCOUNTER — Ambulatory Visit (INDEPENDENT_AMBULATORY_CARE_PROVIDER_SITE_OTHER): Payer: BLUE CROSS/BLUE SHIELD | Admitting: *Deleted

## 2018-03-15 DIAGNOSIS — L500 Allergic urticaria: Secondary | ICD-10-CM

## 2018-03-25 ENCOUNTER — Encounter: Payer: Self-pay | Admitting: Physician Assistant

## 2018-03-25 ENCOUNTER — Ambulatory Visit (INDEPENDENT_AMBULATORY_CARE_PROVIDER_SITE_OTHER): Payer: BLUE CROSS/BLUE SHIELD | Admitting: Physician Assistant

## 2018-03-25 VITALS — BP 135/85 | HR 76 | Temp 98.3°F | Resp 17 | Ht 73.0 in | Wt 245.0 lb

## 2018-03-25 DIAGNOSIS — M25561 Pain in right knee: Secondary | ICD-10-CM | POA: Diagnosis not present

## 2018-03-25 DIAGNOSIS — G8929 Other chronic pain: Secondary | ICD-10-CM

## 2018-03-25 DIAGNOSIS — Z8739 Personal history of other diseases of the musculoskeletal system and connective tissue: Secondary | ICD-10-CM

## 2018-03-25 DIAGNOSIS — I10 Essential (primary) hypertension: Secondary | ICD-10-CM

## 2018-03-25 MED ORDER — LISINOPRIL 10 MG PO TABS: 10.0000 mg | ORAL_TABLET | Freq: Every day | ORAL | 1 refills | Status: DC

## 2018-03-25 MED ORDER — DICLOFENAC SODIUM 75 MG PO TBEC: 75.0000 mg | DELAYED_RELEASE_TABLET | Freq: Two times a day (BID) | ORAL | 2 refills | Status: DC

## 2018-03-25 NOTE — Progress Notes (Signed)
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 He is a pt of Dr. Paralee CancelGreene's.   HTN - He takes Lisinopril 10mg  QD. Blood pressure today 135/85. Home blood pressures are 120's/80's. He is about to run out of his medication. Feeling well today.  Eats a 95% whole food, plant based. Does water aerobics.   Diclofenac - takes this for right knee pain. Takes this daily to avoid flares of "severe pain". Last flare was a few months ago. Flares involve a red, hot and swollen knee.  This happens every 2 or 3 months.  He is unsure if he has gout, but "maybe I do". He has been treated for this in the past. Recently bought a knee brace and a cane in preparation for the flares. He endorses some pain between flares. He has never been evaluated by orthopedics for this problem.   Last CPE 05/2017 with Dr. Neva SeatGreene.   Review of Systems  Cardiovascular: Negative for chest pain and palpitations.  Musculoskeletal: Positive for arthralgias (right knee pain) and gait problem.  Skin: Negative.     Patient Active Problem List   Diagnosis Date Noted  . Other seasonal allergic rhinitis 03/17/2017  . History of kidney stones 04/18/2014  . Essential hypertension 04/18/2014  . LFT elevation 10/08/2012  . Hyperglycemia 10/08/2012    Current Outpatient Medications on File Prior to Visit  Medication Sig Dispense Refill  . aspirin EC 81 MG tablet Take 81 mg by mouth daily.    Marland Kitchen. azithromycin (ZITHROMAX) 250 MG tablet Sig as indicated 6 tablet 0  . cholecalciferol (VITAMIN D) 1000 UNITS tablet Take 1,000 Units by mouth daily.    . colchicine 0.6 MG tablet Take two tabs once at the first sign of a  gout flare and then one an hour later. Repeat in 24 hours if needed. 12 tablet 1  . Cyanocobalamin ER (B-12 DUAL SPECTRUM) 5000 MCG TBCR     . diclofenac (VOLTAREN) 75 MG EC tablet TAKE 1 TABLET BY MOUTH TWICE A DAY 30 tablet 0  . DOCOSAHEXAENOIC ACID PO     . EPINEPHrine (AUVI-Q) 0.3 mg/0.3 mL IJ SOAJ injection Use as directed for severe allergic reaction 2 Device 1  . HYDROcodone-acetaminophen (NORCO) 5-325 MG tablet Take 1-2 tablets by mouth every 6 (six) hours as needed. 30 tablet 0  . lisinopril (PRINIVIL,ZESTRIL) 10 MG tablet Take 1 tablet (10 mg total) by mouth daily. 90 tablet 1  . loratadine (CLARITIN) 10 MG tablet Take by mouth.    . Multiple Vitamin (MULTIVITAMIN WITH MINERALS) TABS Take 1 tablet by mouth daily.    . ondansetron (ZOFRAN) 4 MG tablet Take 1 tablet (4 mg total) by mouth every 6 (six) hours as needed for nausea or vomiting. 12 tablet 0  . oxyCODONE-acetaminophen (PERCOCET) 5-325 MG tablet Take 1 tablet by mouth every 4 (four) hours as needed for moderate pain. 20 tablet 0  . ranitidine (ZANTAC) 150 MG tablet Take by mouth.    . tamsulosin (FLOMAX) 0.4 MG CAPS capsule Take 1 tablet daily as needed for symptoms of kidney stone 15 capsule 1  . triamcinolone (NASACORT) 55 MCG/ACT AERO nasal inhaler Place 2 sprays into the nose  daily. Into each nare 1 Inhaler 12   Current Facility-Administered Medications on File Prior to Visit  Medication Dose Route Frequency Provider Last Rate Last Dose  . omalizumab Geoffry Paradise) injection 300 mg  300 mg Subcutaneous Q28 days Marcelyn Bruins, MD   300 mg at 03/15/18 0844    Allergies  Allergen Reactions  . Pumpkin Seed Hives and Swelling    Swelling of throat, tongue and lips. Swelling of throat, tongue and lips. Swelling of throat, tongue and lips.     Objective:  BP 135/85   Pulse 76   Temp 98.3 F (36.8 C) (Oral)   Resp 17   Ht 6\' 1"  (1.854 m)   Wt 245 lb (111.1 kg)   SpO2 98%   BMI 32.32 kg/m   Physical Exam    Constitutional: He is oriented to person, place, and time. He appears well-developed and well-nourished.  Cardiovascular: Normal rate and regular rhythm.  Neurological: He is alert and oriented to person, place, and time.  Skin: Skin is warm and dry.  Psychiatric: He has a normal mood and affect. His behavior is normal. Judgment and thought content normal.  Vitals reviewed.   Assessment and Plan :  1. Essential hypertension -Patient presents for medication refill of lisinopril 10 mg.  Blood pressure today is 135/85.  Return to clinic in October for annual exam with Dr. Chilton Si. - lisinopril (PRINIVIL,ZESTRIL) 10 MG tablet; Take 1 tablet (10 mg total) by mouth daily.  Dispense: 90 tablet; Refill: 1  2. History of gout 3. Chronic pain of right knee - Patient asking for refill of diclofenac for his chronic knee pain.  HPI suggests gout flares.  According to patient history it sounds like he may benefit from uric acid lowering therapy.  Plan to check uric acid today.  His every day knee pain is worsening, plan to send to Ortho for evaluation.  Follow-up with Dr. Chilton Si. - Uric Acid - Ambulatory referral to Orthopedic Surgery - diclofenac (VOLTAREN) 75 MG EC tablet; Take 1 tablet (75 mg total) by mouth 2 (two) times daily.  Dispense: 30 tablet; Refill: 2     Marco Collie, PA-C  [facility] 03/25/2018 10:01 AM  Please note: Portions of this report may have been transcribed using dragon voice recognition software. Every effort was made to ensure accuracy; however, inadvertent computerized transcription errors may be present.

## 2018-03-25 NOTE — Patient Instructions (Addendum)
Come back in October for annual exam with Dr Neva Seat.  It was a pleasure meeting you today. See below for Low-purine diet and ear wax softening tips.   Please do not use Q-tips, as this will further impact the ear wax in your ear.   If you have cerumen impaction more than once a year you can reduce the occurrences by using a cotton ball dipped in mineral oil and place it in the external canal for 10-20 minutes once a week (combined with eight hours of not using a hearing aid overnight, if applicable). This helps liquify cerumen and aid in the normal elimination mechanisms.    You can also try a liquid stool softener. It works by dissolving or loosening the earwax, allowing it to be more easily removed upon irrigation. Lie on your side with the affected ear facing up and instill warmed (not hot) 1 mL (about 15 drops) of liquid Colace into the ear. Let the liquid sit in the ear for 10-15 minutes so it can work. After 15 minutes, rinse the affected ear with warm water so that you can get the softened earwax out of your canal.   Routine cleaning of ears by a health professional every 6-12 months is recommended.   If you have itchy ears, use sweet oil. If you need more relief use a small amount of hydrocortisone ointment.  ------------------------------------------------------------------------------------------------------------------------------------------------------ Low-Purine Diet Purines are compounds that affect the level of uric acid in your body. A low-purine diet is a diet that is low in purines. Eating a low-purine diet can prevent the level of uric acid in your body from getting too high and causing gout or kidney stones or both. What do I need to know about this diet?  Choose low-purine foods. Examples of low-purine foods are listed in the next section.  Drink plenty of fluids, especially water. Fluids can help remove uric acid from your body. Try to drink 8-16 cups (1.9-3.8 L) a  day.  Limit foods high in fat, especially saturated fat, as fat makes it harder for the body to get rid of uric acid. Foods high in saturated fat include pizza, cheese, ice cream, whole milk, fried foods, and gravies. Choose foods that are lower in fat and lean sources of protein. Use olive oil when cooking as it contains healthy fats that are not high in saturated fat.  Limit alcohol. Alcohol interferes with the elimination of uric acid from your body. If you are having a gout attack, avoid all alcohol.  Keep in mind that different people's bodies react differently to different foods. You will probably learn over time which foods do or do not affect you. If you discover that a food tends to cause your gout to flare up, avoid eating that food. You can more freely enjoy foods that do not cause problems. If you have any questions about a food item, talk to your dietitian or health care provider. Which foods are low, moderate, and high in purines? The following is a list of foods that are low, moderate, and high in purines. You can eat any amount of the foods that are low in purines. You may be able to have small amounts of foods that are moderate in purines. Ask your health care provider how much of a food moderate in purines you can have. Avoid foods high in purines. Grains  Foods low in purines: Enriched white bread, pasta, rice, cake, cornbread, popcorn.  Foods moderate in purines: Whole-grain breads and cereals,  wheat germ, bran, oatmeal. Uncooked oatmeal. Dry wheat bran or wheat germ.  Foods high in purines: Pancakes, [place] toast, biscuits, muffins. Vegetables  Foods low in purines: All vegetables, except those that are moderate in purines.  Foods moderate in purines: Asparagus, cauliflower, spinach, mushrooms, green peas. Fruits  All fruits are low in purines. Meats and other Protein Foods  Foods low in purines: Eggs, nuts, peanut butter.  Foods moderate in purines: 80-90% lean beef,  lamb, veal, pork, poultry, fish, eggs, peanut butter, nuts. Crab, lobster, oysters, and shrimp. Cooked dried beans, peas, and lentils.  Foods high in purines: Anchovies, sardines, herring, mussels, tuna, codfish, scallops, trout, and haddock. Tomasa BlaseBacon. Organ meats (such as liver or kidney). Tripe. Game meat. Goose. Sweetbreads. Dairy  All dairy foods are low in purines. Low-fat and fat-free dairy products are best because they are low in saturated fat. Beverages  Drinks low in purines: Water, carbonated beverages, tea, coffee, cocoa.  Drinks moderate in purines: Soft drinks and other drinks sweetened with high-fructose corn syrup. Juices. To find whether a food or drink is sweetened with high-fructose corn syrup, look at the ingredients list.  Drinks high in purines: Alcoholic beverages (such as beer). Condiments  Foods low in purines: Salt, herbs, olives, pickles, relishes, vinegar.  Foods moderate in purines: Butter, margarine, oils, mayonnaise. Fats and Oils  Foods low in purines: All types, except gravies and sauces made with meat.  Foods high in purines: Gravies and sauces made with meat. Other Foods  Foods low in purines: Sugars, sweets, gelatin. Cake. Soups made without meat.  Foods moderate in purines: Meat-based or fish-based soups, broths, or bouillons. Foods and drinks sweetened with high-fructose corn syrup.  Foods high in purines: High-fat desserts (such as ice cream, cookies, cakes, pies, doughnuts, and chocolate). Contact your dietitian for more information on foods that are not listed here. This information is not intended to replace advice given to you by your health care provider. Make sure you discuss any questions you have with your health care provider. Document Released: 12/12/2010 Document Revised: 01/23/2016 Document Reviewed: 07/24/2013 Elsevier Interactive Patient Education  2017 [employer]   IF you received an x-ray today, you will receive an invoice  from [facility]. Please contact [facility] at [phone] with questions or concerns regarding your invoice.   IF you received labwork today, you will receive an invoice from [facility]. Please contact [facility] at [phone] with questions or concerns regarding your invoice.   Our billing staff will not be able to assist you with questions regarding bills from these companies.  You will be contacted with the lab results as soon as they are available. The fastest way to get your results is to activate your My Chart account. Instructions are located on the last page of this paperwork. If you have not heard from us regarding the results in 2 weeks, please contact this office.

## 2018-03-26 LAB — URIC ACID: Uric Acid: 7.1 mg/dL (ref 3.7–8.6)

## 2018-04-12 ENCOUNTER — Ambulatory Visit (INDEPENDENT_AMBULATORY_CARE_PROVIDER_SITE_OTHER): Payer: BLUE CROSS/BLUE SHIELD

## 2018-04-12 DIAGNOSIS — L500 Allergic urticaria: Secondary | ICD-10-CM | POA: Diagnosis not present

## 2018-04-27 DIAGNOSIS — M2241 Chondromalacia patellae, right knee: Secondary | ICD-10-CM | POA: Diagnosis not present

## 2018-04-27 DIAGNOSIS — M25561 Pain in right knee: Secondary | ICD-10-CM | POA: Diagnosis not present

## 2018-05-06 ENCOUNTER — Other Ambulatory Visit: Payer: Self-pay | Admitting: Physician Assistant

## 2018-05-06 DIAGNOSIS — M25561 Pain in right knee: Principal | ICD-10-CM

## 2018-05-06 DIAGNOSIS — G8929 Other chronic pain: Secondary | ICD-10-CM

## 2018-05-06 NOTE — Telephone Encounter (Signed)
Voltran75 mg refill Last Refill:03/25/18 #30  Last OV: 03/25/18 PCP: [facility] Pharmacy:[id number]

## 2018-05-10 ENCOUNTER — Ambulatory Visit (INDEPENDENT_AMBULATORY_CARE_PROVIDER_SITE_OTHER): Payer: BLUE CROSS/BLUE SHIELD | Admitting: *Deleted

## 2018-05-10 DIAGNOSIS — L500 Allergic urticaria: Secondary | ICD-10-CM

## 2018-05-31 DIAGNOSIS — D2262 Melanocytic nevi of left upper limb, including shoulder: Secondary | ICD-10-CM | POA: Diagnosis not present

## 2018-05-31 DIAGNOSIS — D2271 Melanocytic nevi of right lower limb, including hip: Secondary | ICD-10-CM | POA: Diagnosis not present

## 2018-05-31 DIAGNOSIS — D485 Neoplasm of uncertain behavior of skin: Secondary | ICD-10-CM | POA: Diagnosis not present

## 2018-05-31 DIAGNOSIS — D225 Melanocytic nevi of trunk: Secondary | ICD-10-CM | POA: Diagnosis not present

## 2018-05-31 DIAGNOSIS — D2261 Melanocytic nevi of right upper limb, including shoulder: Secondary | ICD-10-CM | POA: Diagnosis not present

## 2018-06-07 ENCOUNTER — Ambulatory Visit (INDEPENDENT_AMBULATORY_CARE_PROVIDER_SITE_OTHER): Payer: BLUE CROSS/BLUE SHIELD | Admitting: *Deleted

## 2018-06-07 DIAGNOSIS — L500 Allergic urticaria: Secondary | ICD-10-CM

## 2018-06-15 ENCOUNTER — Other Ambulatory Visit: Payer: Self-pay | Admitting: Physician Assistant

## 2018-06-15 DIAGNOSIS — G8929 Other chronic pain: Secondary | ICD-10-CM

## 2018-06-15 DIAGNOSIS — M25561 Pain in right knee: Principal | ICD-10-CM

## 2018-06-20 DIAGNOSIS — D649 Anemia, unspecified: Secondary | ICD-10-CM | POA: Diagnosis not present

## 2018-06-20 DIAGNOSIS — N200 Calculus of kidney: Secondary | ICD-10-CM | POA: Diagnosis not present

## 2018-06-20 DIAGNOSIS — E663 Overweight: Secondary | ICD-10-CM | POA: Diagnosis not present

## 2018-06-20 DIAGNOSIS — I10 Essential (primary) hypertension: Secondary | ICD-10-CM | POA: Diagnosis not present

## 2018-06-27 ENCOUNTER — Ambulatory Visit (INDEPENDENT_AMBULATORY_CARE_PROVIDER_SITE_OTHER): Payer: BLUE CROSS/BLUE SHIELD | Admitting: Family Medicine

## 2018-06-27 ENCOUNTER — Encounter: Payer: Self-pay | Admitting: Family Medicine

## 2018-06-27 VITALS — BP 148/100 | HR 63 | Temp 98.0°F | Ht 73.0 in | Wt 247.4 lb

## 2018-06-27 DIAGNOSIS — H9202 Otalgia, left ear: Secondary | ICD-10-CM | POA: Diagnosis not present

## 2018-06-27 DIAGNOSIS — Z9884 Bariatric surgery status: Secondary | ICD-10-CM | POA: Diagnosis not present

## 2018-06-27 DIAGNOSIS — D649 Anemia, unspecified: Secondary | ICD-10-CM | POA: Diagnosis not present

## 2018-06-27 DIAGNOSIS — H9192 Unspecified hearing loss, left ear: Secondary | ICD-10-CM

## 2018-06-27 DIAGNOSIS — Z125 Encounter for screening for malignant neoplasm of prostate: Secondary | ICD-10-CM | POA: Diagnosis not present

## 2018-06-27 DIAGNOSIS — Z0001 Encounter for general adult medical examination with abnormal findings: Secondary | ICD-10-CM | POA: Diagnosis not present

## 2018-06-27 DIAGNOSIS — Z113 Encounter for screening for infections with a predominantly sexual mode of transmission: Secondary | ICD-10-CM | POA: Diagnosis not present

## 2018-06-27 DIAGNOSIS — Z1321 Encounter for screening for nutritional disorder: Secondary | ICD-10-CM

## 2018-06-27 DIAGNOSIS — I10 Essential (primary) hypertension: Secondary | ICD-10-CM

## 2018-06-27 DIAGNOSIS — Z0000 Encounter for general adult medical examination without abnormal findings: Secondary | ICD-10-CM

## 2018-06-27 DIAGNOSIS — Z13220 Encounter for screening for lipoid disorders: Secondary | ICD-10-CM

## 2018-06-27 MED ORDER — LISINOPRIL 20 MG PO TABS: 20.0000 mg | ORAL_TABLET | Freq: Every day | ORAL | 1 refills | Status: DC

## 2018-06-27 NOTE — Patient Instructions (Addendum)
Start higher dose of lisinopril once per day and recheck in next 6-8 weeks.   I will refer you to ENT for the left ear symptoms.   Continue B12 and vitamin D supplement.  I will check some lab work from today.  Thank you for coming in today.   Keeping you healthy  Get these tests  Blood pressure- Have your blood pressure checked once a year by your healthcare provider.  Normal blood pressure is 120/80  Weight- Have your body mass index (BMI) calculated to screen for obesity.  BMI is a measure of body fat based on height and weight. You can also calculate your own BMI at [email or web address].  Cholesterol- Have your cholesterol checked every year.  Diabetes- Have your blood sugar checked regularly if you have high blood pressure, high cholesterol, have a family history of diabetes or if you are overweight.  Screening for Colon Cancer- Colonoscopy starting at age 62.  Screening may begin sooner depending on your family history and other health conditions. Follow up colonoscopy as directed by your Gastroenterologist.  Screening for Prostate Cancer- Both blood work (PSA) and a rectal exam help screen for Prostate Cancer.  Screening begins at age 10 with African-American men and at age 66 with Caucasian men.  Screening may begin sooner depending on your family history.  Take these medicines  Aspirin- One aspirin daily can help prevent Heart disease and Stroke.  Flu shot- Every fall.  Tetanus- Every 10 years.  Zostavax- Once after the age of 82 to prevent Shingles.  Pneumonia shot- Once after the age of 108; if you are younger than 49, ask your healthcare provider if you need a Pneumonia shot.  Take these steps  Don't smoke- If you do smoke, talk to your doctor about quitting.  For tips on how to quit, go to www.smokefree.gov or call 1-800-QUIT-NOW.  Be physically active- Exercise 5 days a week for at least 30 minutes.  If you are not already physically active start slow and  gradually work up to 30 minutes of moderate physical activity.  Examples of moderate activity include walking briskly, mowing the yard, dancing, swimming, bicycling, etc.  Eat a healthy diet- Eat a variety of healthy food such as fruits, vegetables, low fat milk, low fat cheese, yogurt, lean meant, poultry, fish, beans, tofu, etc. For more information go to www.thenutritionsource.org  Drink alcohol in moderation- Limit alcohol intake to less than two drinks a day. Never drink and drive.  Dentist- Brush and floss twice daily; visit your dentist twice a year.  Depression- Your emotional health is as important as your physical health. If you're feeling down, or losing interest in things you would normally enjoy please talk to your healthcare provider.  Eye exam- Visit your eye doctor every year.  Safe sex- If you may be exposed to a sexually transmitted infection, use a condom.  Seat belts- Seat belts can save your life; always wear one.  Smoke/Carbon Monoxide detectors- These detectors need to be installed on the appropriate level of your home.  Replace batteries at least once a year.  Skin cancer- When out in the sun, cover up and use sunscreen 15 SPF or higher.  Violence- If anyone is threatening you, please tell your healthcare provider.  Living Will/ Health care power of attorney- Speak with your healthcare provider and family.  If you have lab work done today you will be contacted with your lab results within the next 2 weeks.  If you have not  heard from [place] then please contact us. The fastest way to get your results is to register for My Chart.   IF you received an x-ray today, you will receive an invoice from [facility]. Please contact [facility] at [phone] with questions or concerns regarding your invoice.   IF you received labwork today, you will receive an invoice from [facility]. Please contact [facility] at [phone] with questions or concerns regarding  your invoice.   Our billing staff will not be able to assist you with questions regarding bills from these companies.  You will be contacted with the lab results as soon as they are available. The fastest way to get your results is to activate your My Chart account. Instructions are located on the last page of this paperwork. If you have not heard from [place] regarding the results in 2 weeks, please contact this office.

## 2018-06-27 NOTE — Progress Notes (Signed)
Subjective:  By signing my name below, I, Juan Nelson, attest that this documentation has been prepared under the direction and in the presence of Shade Flood, MD Electronically Signed: Charline Bills, ED Scribe 06/27/2018 at 8:23 AM.   Patient ID: Juan Nelson, male    DOB: [birth year], 52 y.o.   MRN: [id number]  Chief Complaint  Patient presents with  . Annual Exam    CPE   HPI Juan Nelson is a 52 y.o. male who presents to Primary Care at [facility] for an annual exam.  HTN Lab Results  Component Value Date   CREATININE 1.05 01/06/2018   BP Readings from Last 3 Encounters:  06/27/18 (!) 148/100  03/25/18 135/85  01/07/18 (!) 130/98  Lisinopril 10 mg qd. When seen in July, BP was controlled. - States he has been checking his BP daily with similar readings (140s/100). Denies missed doses.  Episodic Knee Pain Has used diclofenac. See last OV. Uric acid of 7.1 at that time, suspected gout. 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  Anemia See prior OV. Lab Results  Component Value Date   WBC 4.6 01/06/2018   HGB 14.2 01/06/2018   HCT 40.7 01/06/2018   MCV 90 01/06/2018   PLT 176 01/06/2018  H/o laparoscopic gastric banding. Takes B12 and Vit D supplement (~2000 units).  CA Screening Colonoscopy: 09/03/14 Prostate CA Screening: PSA normal in 2017. - Reports h/o kidney stones with lithotripsy followed by nephrologist Dr. Allena Katz and urology. Pt agrees to PSA and DRE today. Lab Results  Component Value Date   PSA 0.66 01/24/2016   Immunizations Immunization History  Administered Date(s) Administered  . Influenza,inj,Quad PF,6+ Mos 06/03/2017  . Influenza-Unspecified 05/27/2015, 05/01/2016, 05/21/2018  . Tdap 06/03/2017   Depression Screening Depression screen [facility] 2/9 06/27/2018 03/25/2018 01/06/2018 08/16/2017 08/12/2017  Decreased Interest 0 0 0 0 0    Down, Depressed, Hopeless 0 0 0 0 0  PHQ - 2 Score 0 0 0 0 0   STI Screening Requests STI screening. No known exposure but has had unprotected intercourse.  L Ear Pressure/Pain Pt reports intermittent L ear pain and pressure for a while. Also reports some hearing loss and rare tinnitus in the L ear for sev yrs. Pt has not seen ENT.  No exam data present Vision: annually; reports light sensitivity Dentist: every 6 months Exercise: 5 days/wk  Patient Active Problem List   Diagnosis Date Noted  . Other seasonal allergic rhinitis 03/17/2017  . History of kidney stones 04/18/2014  . Essential hypertension 04/18/2014  . LFT elevation 10/08/2012  . Hyperglycemia 10/08/2012   Past Medical History:  Diagnosis Date  . Allergy   . Arthritis    right knee  . GERD (gastroesophageal reflux disease)   . History of kidney stones    multople stones   . Hypertension   . Kidney stone   . Sleep apnea    Past Surgical History:  Procedure Laterality Date  . EXTRACORPOREAL SHOCK WAVE LITHOTRIPSY Right 11/05/2016   Procedure: RIGHT EXTRACORPOREAL SHOCK WAVE LITHOTRIPSY (ESWL);  Surgeon: Malen Gauze, MD;  Location: [facility];  Service: Urology;  Laterality: Right;  . GANGLION CYST EXCISION  1989  . LAPAROSCOPIC GASTRIC BANDING    . NEVUS EXCISION Right 04/28/2017  . SPINE SURGERY     Allergies  Allergen Reactions  . Pumpkin Seed Hives  and Swelling    Swelling of throat, tongue and lips. Swelling of throat, tongue and lips. Swelling of throat, tongue and lips.   Prior to Admission medications   Medication Sig Start Date End Date Taking? Authorizing Provider  aspirin EC 81 MG tablet Take 81 mg by mouth daily.    [provider]  azithromycin (ZITHROMAX) 250 MG tablet Sig as indicated 07/12/17   Georgina Quint, MD  cholecalciferol (VITAMIN D) 1000 UNITS tablet Take 1,000 Units by mouth daily.    [provider]  colchicine 0.6 MG tablet Take two tabs once at  the first sign of a gout flare and then one an hour later. Repeat in 24 hours if needed. 08/16/17   Ofilia Neas, PA-C  Cyanocobalamin ER (B-12 DUAL SPECTRUM) 5000 MCG TBCR  09/01/15   [provider]  diclofenac (VOLTAREN) 75 MG EC tablet TAKE 1 TABLET BY MOUTH TWICE A DAY 06/15/18   Shade Flood, MD  DOCOSAHEXAENOIC ACID PO  05/01/16   [provider]  EPINEPHrine (AUVI-Q) 0.3 mg/0.3 mL IJ SOAJ injection Use as directed for severe allergic reaction 03/17/17   Alfonse Spruce, MD  HYDROcodone-acetaminophen (NORCO) 5-325 MG tablet Take 1-2 tablets by mouth every 6 (six) hours as needed. 06/18/16   Bing Neighbors, FNP  lisinopril (PRINIVIL,ZESTRIL) 10 MG tablet Take 1 tablet (10 mg total) by mouth daily. 03/25/18   McVey, Madelaine Bhat, PA-C  loratadine (CLARITIN) 10 MG tablet Take by mouth. 12/16/10   [provider]  Multiple Vitamin (MULTIVITAMIN WITH MINERALS) TABS Take 1 tablet by mouth daily.    [provider]  ondansetron (ZOFRAN) 4 MG tablet Take 1 tablet (4 mg total) by mouth every 6 (six) hours as needed for nausea or vomiting. 11/15/16   Dione Booze, MD  oxyCODONE-acetaminophen (PERCOCET) 5-325 MG tablet Take 1 tablet by mouth every 4 (four) hours as needed for moderate pain. 11/15/16   Dione Booze, MD  ranitidine (ZANTAC) 150 MG tablet Take by mouth. 12/16/10   [provider]  tamsulosin (FLOMAX) 0.4 MG CAPS capsule Take 1 tablet daily as needed for symptoms of kidney stone 11/15/16   Dione Booze, MD  triamcinolone (NASACORT) 55 MCG/ACT AERO nasal inhaler Place 2 sprays into the nose daily. Into each nare 01/07/18   Sherren Mocha, MD   Social History   Socioeconomic History  . Marital status: Single    Spouse name: Not on file  . Number of children: Not on file  . Years of education: Not on file  . Highest education level: Not on file  Occupational History  . Not on file  Social Needs  . Financial resource strain: Not on  file  . Food insecurity:    Worry: Not on file    Inability: Not on file  . Transportation needs:    Medical: Not on file    Non-medical: Not on file  Tobacco Use  . Smoking status: Never Smoker  . Smokeless tobacco: Never Used  Substance and Sexual Activity  . Alcohol use: No  . Drug use: No  . Sexual activity: Yes  Lifestyle  . Physical activity:    Days per week: Not on file    Minutes per session: Not on file  . Stress: Not on file  Relationships  . Social connections:    Talks on phone: Not on file    Gets together: Not on file    Attends religious service: Not on file  Active member of club or organization: Not on file    Attends meetings of clubs or organizations: Not on file    Relationship status: Not on file  . Intimate partner violence:    Fear of current or ex partner: Not on file    Emotionally abused: Not on file    Physically abused: Not on file    Forced sexual activity: Not on file  Other Topics Concern  . Not on file  Social History Narrative  . Not on file   Review of Systems  HENT: Positive for ear pain, hearing loss and tinnitus.   Eyes: Positive for photophobia.  Genitourinary: Positive for difficulty urinating.  Musculoskeletal: Negative for arthralgias.      Objective:   Physical Exam  Constitutional: He is oriented to person, place, and time. He appears well-developed and well-nourished.  HENT:  Head: Normocephalic and atraumatic.  Right Ear: External ear normal.  Left Ear: External ear normal.  Mouth/Throat: Oropharynx is clear and moist.  Moderate cerumen on the R.  L TM: Canal in clear, TM is pearly grey.  Eyes: Pupils are equal, round, and reactive to light. Conjunctivae and EOM are normal.  Neck: Normal range of motion. Neck supple. No thyromegaly present.  Cardiovascular: Normal rate, regular rhythm, normal heart sounds and intact distal pulses.  Pulmonary/Chest: Effort normal and breath sounds normal. No respiratory distress.  He has no wheezes.  Abdominal: Soft. He exhibits no distension. There is no tenderness. Hernia confirmed negative in the right inguinal area and confirmed negative in the left inguinal area.  Genitourinary: Prostate normal.  Musculoskeletal: Normal range of motion. He exhibits no edema or tenderness.  Lymphadenopathy:    He has no cervical adenopathy.  Neurological: He is alert and oriented to person, place, and time. He has normal reflexes.  Skin: Skin is warm and dry.  Psychiatric: He has a normal mood and affect. His behavior is normal.  Vitals reviewed.  Vitals:   06/27/18 0804 06/27/18 0812  BP: (!) 143/95 (!) 148/100  Pulse: 63   Temp: 98 F (36.7 C)   TempSrc: Oral   SpO2: 98%   Weight: 247 lb 6.4 oz (112.2 kg)   Height: 6\' 1"  (1.854 m)       Assessment & Plan:   Juan Nelson is a 52 y.o. male Annual physical exam  - -anticipatory guidance as below in AVS, screening labs above. Health maintenance items as above in HPI discussed/recommended as applicable.   Hearing loss of left ear, unspecified hearing loss type - Plan: Ambulatory referral to ENT Left ear pain - Plan: Ambulatory referral to ENT  -Refer to ENT as long-standing symptoms.  Likely will need audiologic evaluation.  Essential hypertension - Plan: lisinopril (PRINIVIL,ZESTRIL) 20 MG tablet, Comprehensive metabolic panel  -Decreased control, will try higher dose of lisinopril, potential risks/side effects discussed, check CMP, recheck next 6 to 8 weeks.  Personal history of gastric banding - Plan: Vitamin B12, Vitamin D, 25-hydroxy Encounter for screening for nutritional disorder - Plan: Vitamin B12, Vitamin D, 25-hydroxy  -Check for B12/vitamin D deficiencies.  Continue supplementation.  Routine screening for STI (sexually transmitted infection) - Plan: HIV Antibody (routine testing w rflx), RPR, GC/Chlamydia Probe Amp  -Asymptomatic, routine screening performed  Anemia, unspecified type - Plan:  CBC  -Prior anemia, may be related to previous gastric banding.  Repeat CBC.  Screening for prostate cancer - Plan: PSA  -We discussed pros and cons of prostate cancer screening, and  after this discussion, he chose to have screening done. PSA obtained, and no concerning findings on DRE.   Screening for hyperlipidemia - Plan: Lipid panel     Meds ordered this encounter  Medications  . lisinopril (PRINIVIL,ZESTRIL) 20 MG tablet    Sig: Take 1 tablet (20 mg total) by mouth daily.    Dispense:  90 tablet    Refill:  1   Patient Instructions    Start higher dose of lisinopril once per day and recheck in next 6-8 weeks.   I will refer you to ENT for the left ear symptoms.   Continue B12 and vitamin D supplement.  I will check some lab work from today.  Thank you for coming in today.   Keeping you healthy  Get these tests  Blood pressure- Have your blood pressure checked once a year by your healthcare provider.  Normal blood pressure is 120/80  Weight- Have your body mass index (BMI) calculated to screen for obesity.  BMI is a measure of body fat based on height and weight. You can also calculate your own BMI at [email or web address].  Cholesterol- Have your cholesterol checked every year.  Diabetes- Have your blood sugar checked regularly if you have high blood pressure, high cholesterol, have a family history of diabetes or if you are overweight.  Screening for Colon Cancer- Colonoscopy starting at age 58.  Screening may begin sooner depending on your family history and other health conditions. Follow up colonoscopy as directed by your Gastroenterologist.  Screening for Prostate Cancer- Both blood work (PSA) and a rectal exam help screen for Prostate Cancer.  Screening begins at age 33 with African-American men and at age 68 with Caucasian men.  Screening may begin sooner depending on your family history.  Take these medicines  Aspirin- One aspirin daily can help prevent  Heart disease and Stroke.  Flu shot- Every fall.  Tetanus- Every 10 years.  Zostavax- Once after the age of 89 to prevent Shingles.  Pneumonia shot- Once after the age of 42; if you are younger than 71, ask your healthcare provider if you need a Pneumonia shot.  Take these steps  Don't smoke- If you do smoke, talk to your doctor about quitting.  For tips on how to quit, go to www.smokefree.gov or call 1-800-QUIT-NOW.  Be physically active- Exercise 5 days a week for at least 30 minutes.  If you are not already physically active start slow and gradually work up to 30 minutes of moderate physical activity.  Examples of moderate activity include walking briskly, mowing the yard, dancing, swimming, bicycling, etc.  Eat a healthy diet- Eat a variety of healthy food such as fruits, vegetables, low fat milk, low fat cheese, yogurt, lean meant, poultry, fish, beans, tofu, etc. For more information go to www.thenutritionsource.org  Drink alcohol in moderation- Limit alcohol intake to less than two drinks a day. Never drink and drive.  Dentist- Brush and floss twice daily; visit your dentist twice a year.  Depression- Your emotional health is as important as your physical health. If you're feeling down, or losing interest in things you would normally enjoy please talk to your healthcare provider.  Eye exam- Visit your eye doctor every year.  Safe sex- If you may be exposed to a sexually transmitted infection, use a condom.  Seat belts- Seat belts can save your life; always wear one.  Smoke/Carbon Monoxide detectors- These detectors need to be installed on the appropriate level of your home.  Replace  batteries at least once a year.  Skin cancer- When out in the sun, cover up and use sunscreen 15 SPF or higher.  Violence- If anyone is threatening you, please tell your healthcare provider.  Living Will/ Health care power of attorney- Speak with your healthcare provider and family.  If you  have lab work done today you will be contacted with your lab results within the next 2 weeks.  If you have not heard from [place] then please contact us. The fastest way to get your results is to register for My Chart.   IF you received an x-ray today, you will receive an invoice from [facility]. Please contact [facility] at [phone] with questions or concerns regarding your invoice.   IF you received labwork today, you will receive an invoice from [facility]. Please contact [facility] at [phone] with questions or concerns regarding your invoice.   Our billing staff will not be able to assist you with questions regarding bills from these companies.  You will be contacted with the lab results as soon as they are available. The fastest way to get your results is to activate your My Chart account. Instructions are located on the last page of this paperwork. If you have not heard from [place] regarding the results in 2 weeks, please contact this office.       I personally performed the services described in this documentation, which was scribed in my presence. The recorded information has been reviewed and considered for accuracy and completeness, addended by me as needed, and agree with information above.  Signed,   Meredith Staggers, MD Primary Care at [facility].  06/27/18 11:13 AM

## 2018-06-28 LAB — COMPREHENSIVE METABOLIC PANEL
A/G RATIO: 2.3 — AB (ref 1.2–2.2)
ALBUMIN: 4.5 g/dL (ref 3.5–5.5)
ALT: 52 IU/L — ABNORMAL HIGH (ref 0–44)
AST: 35 IU/L (ref 0–40)
Alkaline Phosphatase: 110 IU/L (ref 39–117)
BUN/Creatinine Ratio: 13 (ref 9–20)
BUN: 14 mg/dL (ref 6–24)
Bilirubin Total: 0.5 mg/dL (ref 0.0–1.2)
CALCIUM: 9.3 mg/dL (ref 8.7–10.2)
CO2: 22 mmol/L (ref 20–29)
CREATININE: 1.07 mg/dL (ref 0.76–1.27)
Chloride: 102 mmol/L (ref 96–106)
GFR, EST AFRICAN AMERICAN: 91 mL/min/{1.73_m2} (ref >59)
GFR, EST NON AFRICAN AMERICAN: 79 mL/min/{1.73_m2} (ref >59)
GLOBULIN, TOTAL: 2 g/dL (ref 1.5–4.5)
Glucose: 97 mg/dL (ref 65–99)
Potassium: 4.3 mmol/L (ref 3.5–5.2)
SODIUM: 142 mmol/L (ref 134–144)
TOTAL PROTEIN: 6.5 g/dL (ref 6.0–8.5)

## 2018-06-28 LAB — GC/CHLAMYDIA PROBE AMP
Chlamydia trachomatis, NAA: NEGATIVE
NEISSERIA GONORRHOEAE BY PCR: NEGATIVE

## 2018-06-28 LAB — LIPID PANEL
CHOL/HDL RATIO: 4.3 ratio (ref 0.0–5.0)
Cholesterol, Total: 137 mg/dL (ref 100–199)
HDL: 32 mg/dL — AB (ref >39)
LDL CALC: 83 mg/dL (ref 0–99)
Triglycerides: 110 mg/dL (ref 0–149)
VLDL Cholesterol Cal: 22 mg/dL (ref 5–40)

## 2018-06-28 LAB — CBC
Hematocrit: 39.5 % (ref 37.5–51.0)
Hemoglobin: 13.7 g/dL (ref 13.0–17.7)
MCH: 30.2 pg (ref 26.6–33.0)
MCHC: 34.7 g/dL (ref 31.5–35.7)
MCV: 87 fL (ref 79–97)
PLATELETS: 191 10*3/uL (ref 150–450)
RBC: 4.53 x10E6/uL (ref 4.14–5.80)
RDW: 13.1 % (ref 12.3–15.4)
WBC: 4.6 10*3/uL (ref 3.4–10.8)

## 2018-06-28 LAB — VITAMIN B12: Vitamin B-12: >2000 pg/mL — ABNORMAL HIGH (ref 232–1245)

## 2018-06-28 LAB — PSA: Prostate Specific Ag, Serum: 0.7 ng/mL (ref 0.0–4.0)

## 2018-06-28 LAB — RPR: RPR Ser Ql: NONREACTIVE

## 2018-06-28 LAB — VITAMIN D 25 HYDROXY (VIT D DEFICIENCY, FRACTURES): Vit D, 25-Hydroxy: 32.2 ng/mL (ref 30.0–100.0)

## 2018-06-28 LAB — HIV ANTIBODY (ROUTINE TESTING W REFLEX): HIV Screen 4th Generation wRfx: NONREACTIVE

## 2018-06-30 ENCOUNTER — Telehealth: Payer: Self-pay | Admitting: Physician Assistant

## 2018-06-30 DIAGNOSIS — L988 Other specified disorders of the skin and subcutaneous tissue: Secondary | ICD-10-CM | POA: Diagnosis not present

## 2018-06-30 DIAGNOSIS — D485 Neoplasm of uncertain behavior of skin: Secondary | ICD-10-CM | POA: Diagnosis not present

## 2018-06-30 NOTE — Telephone Encounter (Signed)
Started PA for pt's Triamcinolone Actonide 55MCG/ACT Aersol  Waiting up to 72 hours.

## 2018-07-01 ENCOUNTER — Telehealth: Payer: Self-pay | Admitting: Family Medicine

## 2018-07-01 NOTE — Telephone Encounter (Signed)
Pt can purchase over the counter or check on their insurance formulary (call co or may be able to find online) to see what, if any, nasal steroid sprays they do cover. I rx'd this 6 mos ago.

## 2018-07-01 NOTE — Telephone Encounter (Signed)
Patient was denied the medication Triamcinolone Acetonide Spray by their insurance company. Denial letter is placed in the providers box.

## 2018-07-05 ENCOUNTER — Ambulatory Visit (INDEPENDENT_AMBULATORY_CARE_PROVIDER_SITE_OTHER): Payer: BLUE CROSS/BLUE SHIELD | Admitting: *Deleted

## 2018-07-05 ENCOUNTER — Telehealth: Payer: Self-pay | Admitting: *Deleted

## 2018-07-05 DIAGNOSIS — L500 Allergic urticaria: Secondary | ICD-10-CM

## 2018-07-05 NOTE — Telephone Encounter (Signed)
LMOM for pt regarding message about nasal spray.

## 2018-07-07 ENCOUNTER — Ambulatory Visit: Payer: BLUE CROSS/BLUE SHIELD | Admitting: Family Medicine

## 2018-07-08 DIAGNOSIS — H903 Sensorineural hearing loss, bilateral: Secondary | ICD-10-CM | POA: Diagnosis not present

## 2018-07-08 DIAGNOSIS — H9313 Tinnitus, bilateral: Secondary | ICD-10-CM | POA: Diagnosis not present

## 2018-07-14 DIAGNOSIS — Z4802 Encounter for removal of sutures: Secondary | ICD-10-CM | POA: Diagnosis not present

## 2018-07-23 ENCOUNTER — Other Ambulatory Visit: Payer: Self-pay | Admitting: Family Medicine

## 2018-07-23 DIAGNOSIS — M25561 Pain in right knee: Principal | ICD-10-CM

## 2018-07-23 DIAGNOSIS — G8929 Other chronic pain: Secondary | ICD-10-CM

## 2018-08-02 ENCOUNTER — Ambulatory Visit (INDEPENDENT_AMBULATORY_CARE_PROVIDER_SITE_OTHER): Payer: BLUE CROSS/BLUE SHIELD | Admitting: *Deleted

## 2018-08-02 DIAGNOSIS — L500 Allergic urticaria: Secondary | ICD-10-CM

## 2018-08-08 ENCOUNTER — Encounter: Payer: Self-pay | Admitting: Family Medicine

## 2018-08-08 ENCOUNTER — Ambulatory Visit (INDEPENDENT_AMBULATORY_CARE_PROVIDER_SITE_OTHER): Payer: BLUE CROSS/BLUE SHIELD | Admitting: Family Medicine

## 2018-08-08 ENCOUNTER — Other Ambulatory Visit: Payer: Self-pay

## 2018-08-08 VITALS — BP 133/89 | HR 74 | Temp 98.4°F | Ht 73.0 in | Wt 243.6 lb

## 2018-08-08 DIAGNOSIS — I10 Essential (primary) hypertension: Secondary | ICD-10-CM

## 2018-08-08 NOTE — Patient Instructions (Addendum)
  New dose of lisinopril appears to be working well.  No changes for now, follow-up in 3 months for repeat blood work.  Thank you for coming in today   If you have lab work done today you will be contacted with your lab results within the next 2 weeks.  If you have not heard from us then please contact us. The fastest way to get your results is to register for My Chart.   IF you received an x-ray today, you will receive an invoice from [facility]. Please contact [facility] at [phone] with questions or concerns regarding your invoice.   IF you received labwork today, you will receive an invoice from [facility]. Please contact [facility] at [phone] with questions or concerns regarding your invoice.   Our billing staff will not be able to assist you with questions regarding bills from these companies.  You will be contacted with the lab results as soon as they are available. The fastest way to get your results is to activate your My Chart account. Instructions are located on the last page of this paperwork. If you have not heard from us regarding the results in 2 weeks, please contact this office.

## 2018-08-08 NOTE — Progress Notes (Signed)
Subjective:    Patient ID: Juan Nelson, male    DOB: [birth year], 52 y.o.   MRN: [id number]  HPI Juan Nelson is a 52 y.o. male Presents today for: Chief Complaint  Patient presents with  . Hypertension    f/u    Hypertension: BP Readings from Last 3 Encounters:  08/08/18 133/89  06/27/18 (!) 148/100  03/25/18 135/85   Lab Results  Component Value Date   CREATININE 1.07 06/27/2018  Decreased control at last visit in October.  Higher dose of lisinopril prescribed, now on 20mg  QD.  Home BP on upper arm meter lower. Low 119, usual range of 120-130's over 80's, rare 90.  No new side effects. Actually feels better on newer dose.   History of elevated LFTs.  Mild/borderline prior. Planned recheck in few months.  Lab Results  Component Value Date   ALT 52 (H) 06/27/2018   AST 35 06/27/2018   ALKPHOS 110 06/27/2018   BILITOT 0.5 06/27/2018      Patient Active Problem List   Diagnosis Date Noted  . Other seasonal allergic rhinitis 03/17/2017  . History of kidney stones 04/18/2014  . Essential hypertension 04/18/2014  . LFT elevation 10/08/2012  . Hyperglycemia 10/08/2012   Past Medical History:  Diagnosis Date  . Allergy   . Arthritis    right knee  . GERD (gastroesophageal reflux disease)   . History of kidney stones    multople stones   . Hypertension   . Kidney stone   . Sleep apnea    Past Surgical History:  Procedure Laterality Date  . EXTRACORPOREAL SHOCK WAVE LITHOTRIPSY Right 11/05/2016   Procedure: RIGHT EXTRACORPOREAL SHOCK WAVE LITHOTRIPSY (ESWL);  Surgeon: Malen Gauze, MD;  Location: [facility];  Service: Urology;  Laterality: Right;  . GANGLION CYST EXCISION  1989  . LAPAROSCOPIC GASTRIC BANDING    . NEVUS EXCISION Right 04/28/2017  . SPINE SURGERY     Allergies  Allergen Reactions  . Pumpkin Seed Hives and Swelling    Swelling of throat, tongue and lips. Swelling of throat, tongue and lips. Swelling of throat, tongue and  lips.   Prior to Admission medications   Medication Sig Start Date End Date Taking? Authorizing Provider  aspirin EC 81 MG tablet Take 81 mg by mouth daily.   Yes [provider]  cholecalciferol (VITAMIN D) 1000 UNITS tablet Take 1,000 Units by mouth daily.   Yes [provider]  colchicine 0.6 MG tablet Take two tabs once at the first sign of a gout flare and then one an hour later. Repeat in 24 hours if needed. 08/16/17  Yes Ofilia Neas, PA-C  Cyanocobalamin ER (B-12 DUAL SPECTRUM) 5000 MCG TBCR  09/01/15  Yes [provider]  diclofenac (VOLTAREN) 75 MG EC tablet TAKE 1 TABLET BY MOUTH TWICE A DAY 07/25/18  Yes Shade Flood, MD  EPINEPHrine (AUVI-Q) 0.3 mg/0.3 mL IJ SOAJ injection Use as directed for severe allergic reaction 03/17/17  Yes Alfonse Spruce, MD  HYDROcodone-acetaminophen (NORCO) 5-325 MG tablet Take 1-2 tablets by mouth every 6 (six) hours as needed. 06/18/16  Yes Bing Neighbors, FNP  lisinopril (PRINIVIL,ZESTRIL) 20 MG tablet Take 1 tablet (20 mg total) by mouth daily. 06/27/18  Yes Shade Flood, MD  Multiple Vitamin (MULTIVITAMIN WITH MINERALS) TABS Take 1 tablet by mouth daily.   Yes [provider]  ondansetron (ZOFRAN) 4 MG tablet Take 1 tablet (4 mg total) by mouth  every 6 (six) hours as needed for nausea or vomiting. 11/15/16  Yes Dione Booze, MD  oxyCODONE-acetaminophen (PERCOCET) 5-325 MG tablet Take 1 tablet by mouth every 4 (four) hours as needed for moderate pain. 11/15/16  Yes Dione Booze, MD  ranitidine (ZANTAC) 150 MG tablet Take by mouth. 12/16/10  Yes [provider]  tamsulosin (FLOMAX) 0.4 MG CAPS capsule Take 1 tablet daily as needed for symptoms of kidney stone 11/15/16  Yes Dione Booze, MD   Social History   Socioeconomic History  . Marital status: Single    Spouse name: Not on file  . Number of children: Not on file  . Years of education: Not on file  . Highest education level: Not on  file  Occupational History  . Not on file  Social Needs  . Financial resource strain: Not on file  . Food insecurity:    Worry: Not on file    Inability: Not on file  . Transportation needs:    Medical: Not on file    Non-medical: Not on file  Tobacco Use  . Smoking status: Never Smoker  . Smokeless tobacco: Never Used  Substance and Sexual Activity  . Alcohol use: No  . Drug use: No  . Sexual activity: Yes  Lifestyle  . Physical activity:    Days per week: Not on file    Minutes per session: Not on file  . Stress: Not on file  Relationships  . Social connections:    Talks on phone: Not on file    Gets together: Not on file    Attends religious service: Not on file    Active member of club or organization: Not on file    Attends meetings of clubs or organizations: Not on file    Relationship status: Not on file  . Intimate partner violence:    Fear of current or ex partner: Not on file    Emotionally abused: Not on file    Physically abused: Not on file    Forced sexual activity: Not on file  Other Topics Concern  . Not on file  Social History Narrative  . Not on file    Review of Systems  Constitutional: Negative for fatigue and unexpected weight change.  Eyes: Negative for visual disturbance.  Respiratory: Negative for cough, chest tightness and shortness of breath.   Cardiovascular: Negative for chest pain, palpitations and leg swelling.  Gastrointestinal: Negative for abdominal pain and blood in stool.  Neurological: Negative for dizziness, light-headedness and headaches.       Objective:   Physical Exam  Constitutional: He is oriented to person, place, and time. He appears well-developed and well-nourished.  HENT:  Head: Normocephalic and atraumatic.  Eyes: Pupils are equal, round, and reactive to light. EOM are normal.  Neck: No JVD present. Carotid bruit is not present.  Cardiovascular: Normal rate, regular rhythm and normal heart sounds.  No murmur  heard. Pulmonary/Chest: Effort normal and breath sounds normal. He has no rales.  Musculoskeletal: He exhibits no edema.  Neurological: He is alert and oriented to person, place, and time.  Skin: Skin is warm and dry.  Psychiatric: He has a normal mood and affect.  Vitals reviewed.  Vitals:   08/08/18 0902  BP: 133/89  Pulse: 74  Temp: 98.4 F (36.9 C)  TempSrc: Oral  SpO2: 97%  Weight: 243 lb 9.6 oz (110.5 kg)  Height: 6\' 1"  (1.854 m)      Assessment & Plan:  Juan Edward  Nelson Nelson is a 52 y.o. male Essential hypertension Improved control on higher dose of lisinopril without side effects.  Continue same, recheck 3 months.  Additionally can recheck LFTs with renal function at that time  No orders of the defined types were placed in this encounter.  Patient Instructions    New dose of lisinopril appears to be working well.  No changes for now, follow-up in 3 months for repeat blood work.  Thank you for coming in today   If you have lab work done today you will be contacted with your lab results within the next 2 weeks.  If you have not heard from us then please contact us. The fastest way to get your results is to register for My Chart.   IF you received an x-ray today, you will receive an invoice from [facility]. Please contact [facility] at [phone] with questions or concerns regarding your invoice.   IF you received labwork today, you will receive an invoice from [facility]. Please contact [facility] at [phone] with questions or concerns regarding your invoice.   Our billing staff will not be able to assist you with questions regarding bills from these companies.  You will be contacted with the lab results as soon as they are available. The fastest way to get your results is to activate your My Chart account. Instructions are located on the last page of this paperwork. If you have not heard from us regarding the results in 2 weeks, please contact  this office.       Signed,   Meredith StaggersJeffrey Daleiza Bacchi, MD Primary Care at [facility].  08/08/18 9:42 AM

## 2018-08-16 ENCOUNTER — Other Ambulatory Visit: Payer: Self-pay | Admitting: Family Medicine

## 2018-08-16 DIAGNOSIS — M25561 Pain in right knee: Principal | ICD-10-CM

## 2018-08-16 DIAGNOSIS — G8929 Other chronic pain: Secondary | ICD-10-CM

## 2018-08-29 ENCOUNTER — Telehealth: Payer: Self-pay | Admitting: Allergy & Immunology

## 2018-08-29 MED ORDER — EPINEPHRINE 0.3 MG/0.3ML IJ SOAJ: 0.3000 mg | Freq: Once | INTRAMUSCULAR | 0 refills | Status: AC

## 2018-08-29 NOTE — Telephone Encounter (Signed)
Pt called and made appointment for 09/22/2018 and needs to have a epi[facility]pen called [facility] on [phone].

## 2018-08-29 NOTE — Telephone Encounter (Signed)
I am sending on one refill until patient is seen in office.

## 2018-09-02 ENCOUNTER — Other Ambulatory Visit: Payer: Self-pay

## 2018-09-02 MED ORDER — EPINEPHRINE 0.3 MG/0.3ML IJ SOAJ: INTRAMUSCULAR | 0 refills | Status: DC

## 2018-09-06 ENCOUNTER — Ambulatory Visit (INDEPENDENT_AMBULATORY_CARE_PROVIDER_SITE_OTHER): Payer: BLUE CROSS/BLUE SHIELD | Admitting: *Deleted

## 2018-09-06 DIAGNOSIS — L500 Allergic urticaria: Secondary | ICD-10-CM

## 2018-09-22 ENCOUNTER — Encounter: Payer: Self-pay | Admitting: Allergy & Immunology

## 2018-09-22 ENCOUNTER — Ambulatory Visit (INDEPENDENT_AMBULATORY_CARE_PROVIDER_SITE_OTHER): Payer: BLUE CROSS/BLUE SHIELD | Admitting: Allergy & Immunology

## 2018-09-22 VITALS — BP 120/80 | HR 88 | Resp 16

## 2018-09-22 DIAGNOSIS — J3089 Other allergic rhinitis: Secondary | ICD-10-CM

## 2018-09-22 DIAGNOSIS — J302 Other seasonal allergic rhinitis: Secondary | ICD-10-CM | POA: Diagnosis not present

## 2018-09-22 DIAGNOSIS — T782XXD Anaphylactic shock, unspecified, subsequent encounter: Secondary | ICD-10-CM

## 2018-09-22 MED ORDER — EPINEPHRINE 0.3 MG/0.3ML IJ SOAJ: INTRAMUSCULAR | 0 refills | Status: DC

## 2018-09-22 NOTE — Patient Instructions (Addendum)
1. 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 - We will continue this for now.  - We will refill the epinephrine auto-injector.  - Introduce the seeds at home and let me know how it goes.   2. Seasonal allergic rhinitis (trees and cats) - Continue with antihistamines as needed.   3. Return in about 1 year (around 09/23/2019).   Please inform us of any Emergency Department visits, hospitalizations, or changes in symptoms. Call us before going to the ED for breathing or allergy symptoms since we might be able to fit you in for a sick visit. Feel free to contact us anytime with any questions, problems, or concerns.  It was a pleasure to see you again today!  Websites that have reliable patient information: 1. American Academy of Asthma, Allergy, and Immunology: www.aaaai.org 2. Food Allergy Research and Education (FARE): foodallergy.org 3. Mothers of Asthmatics: http://www.asthmacommunitynetwork.org 4. American College of Allergy, Asthma, and Immunology: MissingWeapons.ca   Make sure you are registered to vote! If you have moved or changed any of your contact information, you will need to get this updated before voting!    Voter ID laws are going into effect for the General Election in November 2020! Be prepared! Check out Landscaping[employer]est.dk for more details.

## 2018-09-22 NOTE — Progress Notes (Signed)
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 - We will continue this for now.  - We will refill the epinephrine auto-injector.  - Introduce the seeds at home and let me know how it goes.   2. Seasonal allergic rhinitis (trees and cats) - Continue with antihistamines as needed.   3. Return in about 1 year (around 09/23/2019).   Subjective:   Buford DresserClyde B Haug III is a 53 y.o. male presenting today for follow up of  Chief Complaint  Patient presents with  . Urticaria    doing great hasnt had hives     Tallen B Klinedinst III has a history of the following: Patient Active Problem List   Diagnosis Date Noted  . Other seasonal allergic rhinitis 03/17/2017  . History of kidney stones 04/18/2014  . Essential hypertension 04/18/2014  . LFT elevation 10/08/2012  . Hyperglycemia 10/08/2012    History obtained from: chart review and patient.  Buford Dresserlyde B Chavous III's Primary Care Provider is [name], Jeffrey R, MD.     [name] is a 53 y.o. male presenting for a follow up visit. He was last seen in October 2018 when he passed his chia challenge.  He has been on the Xolair now for over a year with good results.  This has prevented additional reactions.  I have not really heard from him since October 2018 but he has been receiving his Xolair monthly.  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 At his last visit he tolerated a chia seed challenge well. It was recommended that he start to slowly introduce seeds into his diet, but he had not felt comfortable doing so yet. He states that after a recent reaction, which was very mild, he feels more comfortable with reintoducing other seeds in  his diet and plans to do so. This reaction occured ealier this month after eating unfamilar food and spread much slower than his previous reactions now that he is on Xolair. He was able to stop the reaction with Benadryl alone prior to development of hive or other significant symptoms. He recently had a new Epi-Pen called in and does not need refills. He has no other complaints and is pleased with the effectiveness of his current regimen.  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 Otherwise, there have been no changes to his past medical history, surgical history, family history, or social history.    Review of Systems: a 14-point review of systems is pertinent for what is mentioned in HPI.  Otherwise, all other systems were negative.  Constitutional: negative other than that listed in the HPI Eyes: negative other than that listed in the HPI Ears, nose, mouth, throat, and face: negative other than that listed in the HPI Respiratory: negative other than that listed in the HPI Cardiovascular: negative other than that listed in the HPI Gastrointestinal: negative other than that listed in the HPI Genitourinary: negative other than that listed in the HPI Integument: negative other than that listed in the HPI Hematologic: negative other than that listed in the HPI Musculoskeletal: negative other than that listed in the HPI Neurological: negative other than that listed in the HPI Allergy/Immunologic: negative  other than that listed in the HPI    Objective:   Blood pressure 120/80, pulse 88, resp. rate 16. There is no height or weight on file to calculate BMI.   Physical Exam:  General: Alert, interactive, in no acute distress.  Pleasant talkative male.  Eyes: No conjunctival injection bilaterally, no discharge on the right, no discharge on the left and no Horner-Trantas dots present. PERRL bilaterally. EOMI without pain. No photophobia.  Ears: Right TM  pearly gray with normal light reflex, Left TM pearly gray with normal light reflex, Right TM intact without perforation and Left TM intact without perforation.  Nose/Throat: External nose within normal limits and septum midline. Turbinates edematous without discharge. Posterior oropharynx mildly erythematous without cobblestoning in the posterior oropharynx. Tonsils 2+ without exudates.  Tongue without thrush. Lungs: Clear to auscultation without wheezing, rhonchi or rales. No increased work of breathing. CV: Normal S1/S2. No murmurs. Capillary refill <2 seconds.  Skin: Warm and dry, without lesions or rashes. Neuro:   Grossly intact. No focal deficits appreciated. Responsive to questions.  Diagnostic studies: none     Malachi Bonds, MD  [facility]

## 2018-10-04 ENCOUNTER — Ambulatory Visit (INDEPENDENT_AMBULATORY_CARE_PROVIDER_SITE_OTHER): Payer: BLUE CROSS/BLUE SHIELD | Admitting: *Deleted

## 2018-10-04 DIAGNOSIS — L501 Idiopathic urticaria: Secondary | ICD-10-CM | POA: Diagnosis not present

## 2018-11-01 ENCOUNTER — Ambulatory Visit (INDEPENDENT_AMBULATORY_CARE_PROVIDER_SITE_OTHER): Payer: BLUE CROSS/BLUE SHIELD | Admitting: *Deleted

## 2018-11-01 DIAGNOSIS — L501 Idiopathic urticaria: Secondary | ICD-10-CM | POA: Diagnosis not present

## 2018-11-07 ENCOUNTER — Ambulatory Visit (INDEPENDENT_AMBULATORY_CARE_PROVIDER_SITE_OTHER): Payer: BLUE CROSS/BLUE SHIELD | Admitting: Family Medicine

## 2018-11-07 ENCOUNTER — Encounter: Payer: Self-pay | Admitting: Family Medicine

## 2018-11-07 ENCOUNTER — Other Ambulatory Visit: Payer: Self-pay

## 2018-11-07 VITALS — BP 120/82 | HR 73 | Temp 97.8°F | Resp 16 | Ht 73.0 in | Wt 248.2 lb

## 2018-11-07 DIAGNOSIS — I10 Essential (primary) hypertension: Secondary | ICD-10-CM | POA: Diagnosis not present

## 2018-11-07 DIAGNOSIS — R945 Abnormal results of liver function studies: Secondary | ICD-10-CM | POA: Diagnosis not present

## 2018-11-07 DIAGNOSIS — R7989 Other specified abnormal findings of blood chemistry: Secondary | ICD-10-CM

## 2018-11-07 MED ORDER — LISINOPRIL 20 MG PO TABS: 20.0000 mg | ORAL_TABLET | Freq: Every day | ORAL | 2 refills | Status: DC

## 2018-11-07 NOTE — Patient Instructions (Addendum)
   No med changes at this time. Follow up in 8 months for a physical.  Let me know if there are questions in the meantime.   If you have lab work done today you will be contacted with your lab results within the next 2 weeks.  If you have not heard from [place] then please contact us. The fastest way to get your results is to register for My Chart.   IF you received an x-ray today, you will receive an invoice from [facility]. Please contact [facility] at [phone] with questions or concerns regarding your invoice.   IF you received labwork today, you will receive an invoice from [place]. Please contact [facility] at [phone] with questions or concerns regarding your invoice.   Our billing staff will not be able to assist you with questions regarding bills from these companies.  You will be contacted with the lab results as soon as they are available. The fastest way to get your results is to activate your My Chart account. Instructions are located on the last page of this paperwork. If you have not heard from [place] regarding the results in 2 weeks, please contact this office.

## 2018-11-07 NOTE — Progress Notes (Signed)
Subjective:    Patient ID: Juan Nelson, male    DOB: [birth year], 53 y.o.   MRN: [id number]  HPI Juan Nelson is a 53 y.o. male Presents today for: Chief Complaint  Patient presents with  . Hypertension    was last seen here 08/09/19 bp at home has bee running low as 120/70 and high as 132/80. last visit lower dose of lisinopril 20 mg   Hypertension: BP Readings from Last 3 Encounters:  11/07/18 120/82  09/22/18 120/80  08/08/18 133/89   Lab Results  Component Value Date   CREATININE 1.07 06/27/2018   Last seen in December, improved control at higher dose of lisinopril.  Home readings 120-30/70-80.  No new side effects.   Elevated LFTs: ALT 52, AST 35 in October 2019.  ALT had increased from a level of 14 in 2018. Wt Readings from Last 3 Encounters:  11/07/18 248 lb 3.2 oz (112.6 kg)  08/08/18 243 lb 9.6 oz (110.5 kg)  06/27/18 247 lb 6.4 oz (112.2 kg)  no alcohol.  Exercise 5 times per week.    Patient Active Problem List   Diagnosis Date Noted  . Other seasonal allergic rhinitis 03/17/2017  . History of kidney stones 04/18/2014  . Essential hypertension 04/18/2014  . LFT elevation 10/08/2012  . Hyperglycemia 10/08/2012   Past Medical History:  Diagnosis Date  . Allergy   . Arthritis    right knee  . GERD (gastroesophageal reflux disease)   . History of kidney stones    multople stones   . Hypertension   . Kidney stone   . Sleep apnea    Past Surgical History:  Procedure Laterality Date  . EXTRACORPOREAL SHOCK WAVE LITHOTRIPSY Right 11/05/2016   Procedure: RIGHT EXTRACORPOREAL SHOCK WAVE LITHOTRIPSY (ESWL);  Surgeon: Malen Gauze, MD;  Location: [facility];  Service: Urology;  Laterality: Right;  . GANGLION CYST EXCISION  1989  . LAPAROSCOPIC GASTRIC BANDING    . NEVUS EXCISION Right 04/28/2017  . SPINE SURGERY     Allergies  Allergen Reactions  . Pumpkin Seed Hives and Swelling    Swelling of throat, tongue and lips. Swelling  of throat, tongue and lips. Swelling of throat, tongue and lips.   Prior to Admission medications   Medication Sig Start Date End Date Taking? Authorizing Provider  aspirin EC 81 MG tablet Take 81 mg by mouth daily.   Yes [provider]  chlorhexidine (PERIDEX) 0.12 % solution RINSE WITH 15 ML BID AFTER MEALS AND THEN SPIT OUT 08/10/18  Yes [provider]  cholecalciferol (VITAMIN D) 1000 UNITS tablet Take 1,000 Units by mouth daily.   Yes [provider]  colchicine 0.6 MG tablet Take two tabs once at the first sign of a gout flare and then one an hour later. Repeat in 24 hours if needed. 08/16/17  Yes Ofilia Neas, PA-C  Cyanocobalamin ER (B-12 DUAL SPECTRUM) 5000 MCG TBCR  09/01/15  Yes [provider]  diclofenac (VOLTAREN) 75 MG EC tablet TAKE 1 TABLET BY MOUTH TWICE A DAY 07/25/18  Yes Shade Flood, MD  EPINEPHrine (AUVI-Q) 0.3 mg/0.3 mL IJ SOAJ injection Use as directed for severe allergic reaction. Appointment required for future refills. 09/22/18  Yes Alfonse Spruce, MD  lisinopril (PRINIVIL,ZESTRIL) 20 MG tablet Take 1 tablet (20 mg total) by mouth daily. 06/27/18  Yes Shade Flood, MD  Multiple Vitamin (MULTIVITAMIN WITH MINERALS) TABS Take 1 tablet by mouth daily.  Yes [provider]  ranitidine (ZANTAC) 150 MG tablet Take by mouth. 12/16/10  Yes [provider]  sodium bicarbonate 325 MG tablet Take 325 mg by mouth 4 (four) times daily.   Yes [provider]  tamsulosin (FLOMAX) 0.4 MG CAPS capsule Take 1 tablet daily as needed for symptoms of kidney stone 11/15/16  Yes Dione Booze, MD   Social History   Socioeconomic History  . Marital status: Single    Spouse name: Not on file  . Number of children: Not on file  . Years of education: Not on file  . Highest education level: Not on file  Occupational History  . Not on file  Social Needs  . Financial resource strain: Not on file  . Food  insecurity:    Worry: Not on file    Inability: Not on file  . Transportation needs:    Medical: Not on file    Non-medical: Not on file  Tobacco Use  . Smoking status: Never Smoker  . Smokeless tobacco: Never Used  Substance and Sexual Activity  . Alcohol use: No  . Drug use: No  . Sexual activity: Yes  Lifestyle  . Physical activity:    Days per week: Not on file    Minutes per session: Not on file  . Stress: Not on file  Relationships  . Social connections:    Talks on phone: Not on file    Gets together: Not on file    Attends religious service: Not on file    Active member of club or organization: Not on file    Attends meetings of clubs or organizations: Not on file    Relationship status: Not on file  . Intimate partner violence:    Fear of current or ex partner: Not on file    Emotionally abused: Not on file    Physically abused: Not on file    Forced sexual activity: Not on file  Other Topics Concern  . Not on file  Social History Narrative  . Not on file    Review of Systems  Constitutional: Negative for fatigue and unexpected weight change.  Eyes: Negative for visual disturbance.  Respiratory: Negative for cough, chest tightness and shortness of breath.   Cardiovascular: Negative for chest pain, palpitations and leg swelling.  Gastrointestinal: Negative for abdominal pain and blood in stool.  Neurological: Negative for dizziness, light-headedness and headaches.       Objective:   Physical Exam Vitals signs reviewed.  Constitutional:      Appearance: He is well-developed.  HENT:     Head: Normocephalic and atraumatic.  Eyes:     Pupils: Pupils are equal, round, and reactive to light.  Neck:     Vascular: No carotid bruit or JVD.  Cardiovascular:     Rate and Rhythm: Normal rate and regular rhythm.     Heart sounds: Normal heart sounds. No murmur.  Pulmonary:     Effort: Pulmonary effort is normal.     Breath sounds: Normal breath sounds. No  rales.  Abdominal:     General: Abdomen is flat. There is no distension.     Palpations: There is no mass.     Tenderness: There is no abdominal tenderness.  Skin:    General: Skin is warm and dry.  Neurological:     Mental Status: He is alert and oriented to person, place, and time.    Vitals:   11/07/18 0804  BP: 120/82  Pulse:  73  Resp: 16  Temp: 97.8 F (36.6 C)  TempSrc: Oral  SpO2: 98%  Weight: 248 lb 3.2 oz (112.6 kg)  Height: 6\' 1"  (1.854 m)       Assessment & Plan:   Juan Nelson is a 53 y.o. male Essential hypertension - Plan: Comprehensive metabolic panel, lisinopril (PRINIVIL,ZESTRIL) 20 MG tablet  -  Stable, tolerating current regimen. Medications refilled. Labs pending as above.   Elevated LFTs - Plan: Comprehensive metabolic panel  - denies alcohol intake, has been exercising. Repeat testing.   Recheck 8 months for CPE.   Meds ordered this encounter  Medications  . lisinopril (PRINIVIL,ZESTRIL) 20 MG tablet    Sig: Take 1 tablet (20 mg total) by mouth daily.    Dispense:  90 tablet    Refill:  2   Patient Instructions     No med changes at this time. Follow up in 8 months for a physical.  Let me know if there are questions in the meantime.   If you have lab work done today you will be contacted with your lab results within the next 2 weeks.  If you have not heard from [place] then please contact us. The fastest way to get your results is to register for My Chart.   IF you received an x-ray today, you will receive an invoice from [facility]. Please contact [facility] at [phone] with questions or concerns regarding your invoice.   IF you received labwork today, you will receive an invoice from [place]. Please contact [facility] at [phone] with questions or concerns regarding your invoice.   Our billing staff will not be able to assist you with questions regarding bills from these companies.  You will be contacted  with the lab results as soon as they are available. The fastest way to get your results is to activate your My Chart account. Instructions are located on the last page of this paperwork. If you have not heard from [place] regarding the results in 2 weeks, please contact this office.       Signed,   Meredith Staggers, MD Primary Care at [facility].  11/07/18 8:24 AM

## 2018-11-08 LAB — COMPREHENSIVE METABOLIC PANEL
ALT: 45 IU/L — ABNORMAL HIGH (ref 0–44)
AST: 30 IU/L (ref 0–40)
Albumin/Globulin Ratio: 2 (ref 1.2–2.2)
Albumin: 4.1 g/dL (ref 3.8–4.9)
Alkaline Phosphatase: 117 IU/L (ref 39–117)
BILIRUBIN TOTAL: 0.5 mg/dL (ref 0.0–1.2)
BUN/Creatinine Ratio: 10 (ref 9–20)
BUN: 10 mg/dL (ref 6–24)
CO2: 21 mmol/L (ref 20–29)
Calcium: 8.7 mg/dL (ref 8.7–10.2)
Chloride: 105 mmol/L (ref 96–106)
Creatinine, Ser: 1 mg/dL (ref 0.76–1.27)
GFR calc Af Amer: 98 mL/min/{1.73_m2} (ref >59)
GFR calc non Af Amer: 85 mL/min/{1.73_m2} (ref >59)
Globulin, Total: 2.1 g/dL (ref 1.5–4.5)
Glucose: 103 mg/dL — ABNORMAL HIGH (ref 65–99)
POTASSIUM: 4.3 mmol/L (ref 3.5–5.2)
Sodium: 141 mmol/L (ref 134–144)
Total Protein: 6.2 g/dL (ref 6.0–8.5)

## 2018-11-24 ENCOUNTER — Other Ambulatory Visit: Payer: Self-pay | Admitting: Family Medicine

## 2018-11-24 DIAGNOSIS — G8929 Other chronic pain: Secondary | ICD-10-CM

## 2018-11-24 DIAGNOSIS — M25561 Pain in right knee: Principal | ICD-10-CM

## 2018-11-29 ENCOUNTER — Ambulatory Visit (INDEPENDENT_AMBULATORY_CARE_PROVIDER_SITE_OTHER): Payer: BLUE CROSS/BLUE SHIELD | Admitting: *Deleted

## 2018-11-29 DIAGNOSIS — L501 Idiopathic urticaria: Secondary | ICD-10-CM

## 2018-12-27 ENCOUNTER — Other Ambulatory Visit: Payer: Self-pay

## 2018-12-27 ENCOUNTER — Ambulatory Visit (INDEPENDENT_AMBULATORY_CARE_PROVIDER_SITE_OTHER): Payer: BLUE CROSS/BLUE SHIELD | Admitting: *Deleted

## 2018-12-27 DIAGNOSIS — L501 Idiopathic urticaria: Secondary | ICD-10-CM | POA: Diagnosis not present

## 2019-01-03 ENCOUNTER — Ambulatory Visit (INDEPENDENT_AMBULATORY_CARE_PROVIDER_SITE_OTHER): Payer: BLUE CROSS/BLUE SHIELD | Admitting: Family Medicine

## 2019-01-03 ENCOUNTER — Encounter: Payer: Self-pay | Admitting: Family Medicine

## 2019-01-03 ENCOUNTER — Other Ambulatory Visit: Payer: Self-pay

## 2019-01-03 VITALS — BP 140/83 | HR 72 | Temp 98.3°F | Resp 14 | Wt 250.2 lb

## 2019-01-03 DIAGNOSIS — R29898 Other symptoms and signs involving the musculoskeletal system: Secondary | ICD-10-CM

## 2019-01-03 DIAGNOSIS — R1032 Left lower quadrant pain: Secondary | ICD-10-CM | POA: Diagnosis not present

## 2019-01-03 DIAGNOSIS — Z87442 Personal history of urinary calculi: Secondary | ICD-10-CM

## 2019-01-03 DIAGNOSIS — M545 Low back pain, unspecified: Secondary | ICD-10-CM

## 2019-01-03 DIAGNOSIS — M79602 Pain in left arm: Secondary | ICD-10-CM

## 2019-01-03 DIAGNOSIS — R109 Unspecified abdominal pain: Secondary | ICD-10-CM

## 2019-01-03 LAB — POCT URINALYSIS DIP (MANUAL ENTRY)
Bilirubin, UA: NEGATIVE
Blood, UA: NEGATIVE
Glucose, UA: NEGATIVE mg/dL
Ketones, POC UA: NEGATIVE mg/dL
Leukocytes, UA: NEGATIVE
Nitrite, UA: NEGATIVE
Protein Ur, POC: NEGATIVE mg/dL
Spec Grav, UA: 1.01 (ref 1.010–1.025)
Urobilinogen, UA: 0.2 E.U./dL
pH, UA: 6.5 (ref 5.0–8.0)

## 2019-01-03 MED ORDER — MELOXICAM 7.5 MG PO TABS: 7.5000 mg | ORAL_TABLET | Freq: Every day | ORAL | 0 refills | Status: DC

## 2019-01-03 MED ORDER — HYDROCODONE-ACETAMINOPHEN 5-325 MG PO TABS: 1.0000 | ORAL_TABLET | Freq: Four times a day (QID) | ORAL | 0 refills | Status: DC | PRN

## 2019-01-03 NOTE — Patient Instructions (Addendum)
Arm pain/weakness could have been pinched nerve from neck or more muscular cause.  I do not appreciate any weakness in that arm at this time and your exam is overall reassuring.  Can try short-term meloxicam once per day as needed for now, stop once symptoms have resolved.  If any further return of weakness or any new/worsening symptoms, or not continuing to improve in the next week or 2, would recommend repeat exam.  Back pain could also be muscular, but with history of kidney stones and radiating pain, I think it is reasonable to check CT scan.  That has been ordered at [facility] imaging, you can walk-in in the morning to have that done.  Make sure you there early as the walk-ins apparently stop at 11 AM.  If worsening pain, I did write for hydrocodone temporarily.  Return to the clinic or go to the nearest emergency room if any of your symptoms worsen or new symptoms occur.  [facility] [address] [place], [place] [address]  If you have lab work done today you will be contacted with your lab results within the next 2 weeks.  If you have not heard from [place] then please contact us. The fastest way to get your results is to register for My Chart.   IF you received an x-ray today, you will receive an invoice from [facility]. Please contact [facility] at [phone] with questions or concerns regarding your invoice.   IF you received labwork today, you will receive an invoice from [facility]. Please contact [facility] at [phone] with questions or concerns regarding your invoice.   Our billing staff will not be able to assist you with questions regarding bills from these companies.  You will be contacted with the lab results as soon as they are available. The fastest way to get your results is to activate your My Chart account. Instructions are located on the last page of this paperwork. If you have not heard from [place] regarding the results in 2 weeks, please contact  this office.

## 2019-01-03 NOTE — Progress Notes (Signed)
Subjective:    Patient ID: Juan Nelson, male    DOB: 1966/03/31, 53 y.o.   MRN: [id number]  HPI Juan Nelson Nelson is a 53 y.o. male Presents today for: Chief Complaint  Patient presents with   Flank Pain    ain o the left flank area. Not having any urinary issus for 2weeks   Deltoid pain    left deltoid pain for the past 3 week. Difficult lifting arm at time. Pain with certain movement     L flank pain: L low back 2 weeks ago - moved to left flank 2 days ago. Catching pain, intense pain. Comes and goes. Some pain with mvmt. Min dysuria past 2 weeks. Able to produce urine.  No penile d/c.  No gross hematuria.   Hx kidney stone - in past year, feels different - more painful.  Tx: flomax - started last week.   L shoulder/deltoid weakness? 3 weeks ago in bed. Trying to move left arm. Uncomfortable, but not painful but just unable to move normally. Had to move with R arm. NKI.  No neck pain. No radicular pain into hand.  No HA, facial droop, or slurred speech.  No leg weakness.  No prior simalr sx's.   Better, but still sensation in upper arm- soreness arm elevation.  L hand dominant.   Patient Active Problem List   Diagnosis Date Noted   Other seasonal allergic rhinitis 03/17/2017   History of kidney stones 04/18/2014   Essential hypertension 04/18/2014   LFT elevation 10/08/2012   Hyperglycemia 10/08/2012   Past Medical History:  Diagnosis Date   Allergy    Arthritis    right knee   GERD (gastroesophageal reflux disease)    History of kidney stones    multople stones    Hypertension    Kidney stone    Sleep apnea    Past Surgical History:  Procedure Laterality Date   EXTRACORPOREAL SHOCK WAVE LITHOTRIPSY Right 11/05/2016   Procedure: RIGHT EXTRACORPOREAL SHOCK WAVE LITHOTRIPSY (ESWL);  Surgeon: Malen Gauze, MD;  Location: [facility];  Service: Urology;  Laterality: Right;   GANGLION CYST EXCISION  1989   LAPAROSCOPIC GASTRIC  BANDING     NEVUS EXCISION Right 04/28/2017   SPINE SURGERY     Allergies  Allergen Reactions   Pumpkin Seed Hives and Swelling    Swelling of throat, tongue and lips. Swelling of throat, tongue and lips. Swelling of throat, tongue and lips.   Prior to Admission medications   Medication Sig Start Date End Date Taking? Authorizing Provider  aspirin EC 81 MG tablet Take 81 mg by mouth daily.   Yes [provider]  chlorhexidine (PERIDEX) 0.12 % solution RINSE WITH 15 ML BID AFTER MEALS AND THEN SPIT OUT 08/10/18  Yes [provider]  cholecalciferol (VITAMIN D) 1000 UNITS tablet Take 1,000 Units by mouth daily.   Yes [provider]  colchicine 0.6 MG tablet Take two tabs once at the first sign of a gout flare and then one an hour later. Repeat in 24 hours if needed. 08/16/17  Yes Ofilia Neas, PA-C  Cyanocobalamin ER (B-12 DUAL SPECTRUM) 5000 MCG TBCR  09/01/15  Yes [provider]  diclofenac (VOLTAREN) 75 MG EC tablet TAKE 1 TABLET BY MOUTH TWICE A DAY 11/24/18  Yes Shade Flood, MD  EPINEPHrine (AUVI-Q) 0.3 mg/0.3 mL IJ SOAJ injection Use as directed for severe allergic reaction. Appointment required for future refills. 09/22/18  Yes Alfonse SpruceGallagher, Joel Louis, MD  lisinopril (PRINIVIL,ZESTRIL) 20 MG tablet Take 1 tablet (20 mg total) by mouth daily. 11/07/18  Yes [name], Orland Visconti R, MD  Multiple Vitamin (MULTIVITAMIN WITH MINERALS) TABS Take 1 tablet by mouth daily.   Yes [provider]  sodium bicarbonate 325 MG tablet Take 325 mg by mouth 4 (four) times daily.   Yes [provider]  tamsulosin (FLOMAX) 0.4 MG CAPS capsule Take 1 tablet daily as needed for symptoms of kidney stone 11/15/16  Yes Dione BoozeGlick, David, MD   Social History   Socioeconomic History   Marital status: Single    Spouse name: Not on file   Number of children: Not on file   Years of education: Not on file   Highest education level: Not on file  Occupational  History   Not on file  Social Needs   Financial resource strain: Not on file   Food insecurity:    Worry: Not on file    Inability: Not on file   Transportation needs:    Medical: Not on file    Non-medical: Not on file  Tobacco Use   Smoking status: Never Smoker   Smokeless tobacco: Never Used  Substance and Sexual Activity   Alcohol use: No   Drug use: No   Sexual activity: Yes  Lifestyle   Physical activity:    Days per week: Not on file    Minutes per session: Not on file   Stress: Not on file  Relationships   Social connections:    Talks on phone: Not on file    Gets together: Not on file    Attends religious service: Not on file    Active member of club or organization: Not on file    Attends meetings of clubs or organizations: Not on file    Relationship status: Not on file   Intimate partner violence:    Fear of current or ex partner: Not on file    Emotionally abused: Not on file    Physically abused: Not on file    Forced sexual activity: Not on file  Other Topics Concern   Not on file  Social History Narrative   Not on file    Review of Systems Per HPI    Objective:   Physical Exam Vitals signs reviewed.  Constitutional:      Appearance: He is well-developed.  HENT:     Head: Normocephalic and atraumatic.  Eyes:     Pupils: Pupils are equal, round, and reactive to light.  Neck:     Vascular: No carotid bruit or JVD.  Cardiovascular:     Rate and Rhythm: Normal rate and regular rhythm.     Heart sounds: Normal heart sounds. No murmur.  Pulmonary:     Effort: Pulmonary effort is normal.     Breath sounds: Normal breath sounds. No rales.  Abdominal:     General: There is no distension.     Tenderness: There is no abdominal tenderness. There is no right CVA tenderness, left CVA tenderness, guarding or rebound.  Musculoskeletal:     Comments: Cervical spine intact range of motion, some clicking but no radiating pain towards  shoulder or arm.  No focal bony tenderness midline.  Left shoulder, no [place]/AC or other bony tenderness.  Full range of motion of shoulder.  Full RTC strength, negative impingement signs, negative crossover.  No focal bony tenderness of upper arm.  Left elbow pain-free, full range of motion  LS spine slight discomfort left-sided with left rotation and left lateral flexion but equal range of motion, negative SLR  Skin:    General: Skin is warm and dry.  Neurological:     General: No focal deficit present.     Mental Status: He is alert and oriented to person, place, and time. Mental status is at baseline.     Motor: No weakness.     Coordination: Coordination normal.     Gait: Gait normal.     Comments: Normal speech, no facial droop, grip strength, upper arm strength including biceps and triceps strength were equal and intact.  No pronator drift, nonfocal neurologic exam  Psychiatric:        Mood and Affect: Mood normal.        Behavior: Behavior normal.    Vitals:   01/03/19 1430  BP: 140/83  Pulse: 72  Resp: 14  Temp: 98.3 F (36.8 C)  TempSrc: Oral  SpO2: 97%  Weight: 250 lb 3.2 oz (113.5 kg)      Results for orders placed or performed in visit on 01/03/19  POCT urinalysis dipstick  Result Value Ref Range   Color, UA yellow yellow   Clarity, UA clear clear   Glucose, UA negative negative mg/dL   Bilirubin, UA negative negative   Ketones, POC UA negative negative mg/dL   Spec Grav, UA 1.610 9.604 - 1.025   Blood, UA negative negative   pH, UA 6.5 5.0 - 8.0   Protein Ur, POC negative negative mg/dL   Urobilinogen, UA 0.2 0.2 or 1.0 E.U./dL   Nitrite, UA Negative Negative   Leukocytes, UA Negative Negative      Assessment & Plan:   Juan Nelson is a 53 y.o. male Flank pain - Plan: POCT urinalysis dipstick, meloxicam (MOBIC) 7.5 MG tablet, HYDROcodone-acetaminophen (NORCO/VICODIN) 5-325 MG tablet Left lower quadrant abdominal pain - Plan: meloxicam (MOBIC)  7.5 MG tablet, CT RENAL STONE STUDY History of nephrolithiasis - Plan: HYDROcodone-acetaminophen (NORCO/VICODIN) 5-325 MG tablet Left-sided low back pain without sciatica, unspecified chronicity - Plan: HYDROcodone-acetaminophen (NORCO/VICODIN) 5-325 MG tablet  -Some reproduction with motion, possible lumbar spine/musculoskeletal source but with history of nephrolithiasis and colicky pain, possible nephrolithiasis recurrence.  Worsening symptoms past 2 days.  Will schedule for CT renal stone study tomorrow.  Continue Flomax for now, fluids, hydrocodone provided if needed for more severe pain  Left arm pain - Plan: meloxicam (MOBIC) 7.5 MG tablet Left arm weakness - Plan: meloxicam (MOBIC) 7.5 MG tablet  -No focal tenderness on exam at this time, good range of motion of shoulder as well as strength, equal arm strength and no weakness was appreciated.  Initial symptoms reported as weakness but also some discomfort with movement.  Potentially could have had neuronal impingement from cervical spine, or musculoskeletal cause, unlikely neurologic.  Again reassuring exam at present.  -Trial of meloxicam 7.5 mg daily for now as improving, follow-up if not continue to improve or any acute worsening symptoms.  ER precautions given.  Meds ordered this encounter  Medications   meloxicam (MOBIC) 7.5 MG tablet    Sig: Take 1 tablet (7.5 mg total) by mouth daily.    Dispense:  30 tablet    Refill:  0   HYDROcodone-acetaminophen (NORCO/VICODIN) 5-325 MG tablet    Sig: Take 1 tablet by mouth every 6 (six) hours as needed for moderate pain.    Dispense:  15 tablet    Refill:  0   Patient Instructions  Arm pain/weakness could have been pinched nerve from neck or more muscular cause.  I do not appreciate any weakness in that arm at this time and your exam is overall reassuring.  Can try short-term meloxicam once per day as needed for now, stop once symptoms have resolved.  If any further return of weakness or  any new/worsening symptoms, or not continuing to improve in the next week or 2, would recommend repeat exam.  Back pain could also be muscular, but with history of kidney stones and radiating pain, I think it is reasonable to check CT scan.  That has been ordered at [facility] imaging, you can walk-in in the morning to have that done.  Make sure you there early as the walk-ins apparently stop at 11 AM.  If worsening pain, I did write for hydrocodone temporarily.  Return to the clinic or go to the nearest emergency room if any of your symptoms worsen or new symptoms occur.  [facility] [address] [place], [place] [address]  If you have lab work done today you will be contacted with your lab results within the next 2 weeks.  If you have not heard from [place] then please contact us. The fastest way to get your results is to register for My Chart.   IF you received an x-ray today, you will receive an invoice from [facility]. Please contact [facility]iology at [phone] with questions or concerns regarding your invoice.   IF you received labwork today, you will receive an invoice from [facility]. Please contact [facility] at [phone] with questions or concerns regarding your invoice.   Our billing staff will not be able to assist you with questions regarding bills from these companies.  You will be contacted with the lab results as soon as they are available. The fastest way to get your results is to activate your My Chart account. Instructions are located on the last page of this paperwork. If you have not heard from [place] regarding the results in 2 weeks, please contact this office.       Signed,   Meredith Staggers, MD [facility] at [facility].  01/03/19 2:47 PM

## 2019-01-04 ENCOUNTER — Other Ambulatory Visit: Payer: Self-pay

## 2019-01-04 ENCOUNTER — Ambulatory Visit
Admission: RE | Admit: 2019-01-04 | Discharge: 2019-01-04 | Disposition: A | Discharge status: Home / Self Care | Payer: BLUE CROSS/BLUE SHIELD | Source: Ambulatory Visit | Attending: Family Medicine | Admitting: Family Medicine

## 2019-01-04 ENCOUNTER — Encounter: Payer: Self-pay | Admitting: Family Medicine

## 2019-01-04 DIAGNOSIS — R519 Headache, unspecified: Secondary | ICD-10-CM

## 2019-01-04 DIAGNOSIS — R29898 Other symptoms and signs involving the musculoskeletal system: Secondary | ICD-10-CM

## 2019-01-04 DIAGNOSIS — R51 Headache: Secondary | ICD-10-CM

## 2019-01-04 DIAGNOSIS — R1032 Left lower quadrant pain: Secondary | ICD-10-CM | POA: Diagnosis not present

## 2019-01-05 ENCOUNTER — Telehealth: Payer: Self-pay | Admitting: Family Medicine

## 2019-01-05 NOTE — Telephone Encounter (Signed)
Talked to [facility]:  Order ID: [id number], completed. No peer-peer needed, or further info needed.  Case closed per Glynise.   13 min call.

## 2019-01-05 NOTE — Telephone Encounter (Signed)
-----   Message from [employer] sent at 01/04/2019  9:12 AM EDT ----- Regarding: CT scan Spoke to Sarah (nurse) at [facility] speciality she stated he needs an updated [place] , but gave me a order number [id number] Valid 01/03/2019- 04/02/2019 but it is not a guarantee of payment because it does not meet [place] per [facility] special[facility]   Please Adv

## 2019-01-14 ENCOUNTER — Other Ambulatory Visit: Payer: Self-pay | Admitting: Family Medicine

## 2019-01-14 DIAGNOSIS — G8929 Other chronic pain: Secondary | ICD-10-CM

## 2019-01-17 ENCOUNTER — Telehealth: Payer: Self-pay | Admitting: *Deleted

## 2019-01-17 ENCOUNTER — Encounter: Payer: Self-pay | Admitting: *Deleted

## 2019-01-17 NOTE — Telephone Encounter (Signed)
Received call back from patient, updated his EMR.

## 2019-01-17 NOTE — Telephone Encounter (Signed)
LVM requesting call back to update EMR.  

## 2019-01-18 ENCOUNTER — Ambulatory Visit (INDEPENDENT_AMBULATORY_CARE_PROVIDER_SITE_OTHER): Payer: BLUE CROSS/BLUE SHIELD | Admitting: Diagnostic Neuroimaging

## 2019-01-18 ENCOUNTER — Encounter: Payer: Self-pay | Admitting: Diagnostic Neuroimaging

## 2019-01-18 ENCOUNTER — Other Ambulatory Visit: Payer: Self-pay

## 2019-01-18 DIAGNOSIS — R51 Headache: Secondary | ICD-10-CM | POA: Diagnosis not present

## 2019-01-18 DIAGNOSIS — R519 Headache, unspecified: Secondary | ICD-10-CM

## 2019-01-18 DIAGNOSIS — R29898 Other symptoms and signs involving the musculoskeletal system: Secondary | ICD-10-CM | POA: Diagnosis not present

## 2019-01-18 NOTE — Telephone Encounter (Signed)
Please advise  Patient is requesting a refill of the following medications: Requested Prescriptions   Pending Prescriptions Disp Refills  . diclofenac (VOLTAREN) 75 MG EC tablet [Pharmacy Med Name: DICLOFENAC SOD EC 75 MG TAB] 60 tablet 1    Sig: TAKE 1 TABLET BY MOUTH TWICE A DAY

## 2019-01-18 NOTE — Progress Notes (Signed)
[facility]  PATIENT: Juan Nelson DOB: [birth year]  REFERRING CLININUR KRASINSKIarson HISTORY FROM: patient  REASON FOR VISIT: new consult    HISTORICAL  CHIEF COMPLAINT:  Chief Complaint  Patient presents with  . Headache    HISTORY OF PRESENT ILLNESS:   53 year old male here for evaluation of headache, left arm weakness, speech difficulty.  March 2020 patient was under extreme stress, with lack of sleep and insomnia.  Around that time he also noted left arm weakness and aching pain proximally, mainly when he would lay down to go to sleep.  Symptoms does wake him up at night.  During the day he did not seem to notice any weakness in his arm.  No problem with his left hand or fingers.  Patient is a Dance movement psychotherapist and had no problems playing piano during this time.  He did have some left flank pain around the same time and was diagnosed with kidney stones.  Around this time patient is also having right-sided headache associated with it, nausea, photophobia.  He would take [facility] powder with good relief.  Around this time patient also had several episodes of speech and language difficulty where he had to repeat himself on the phone because his words do not come out correctly.  In the past 1 month symptoms have essentially resolved.  No prior history of migraine headaches.  I previously saw patient 10 years ago for right leg muscle weakness and wasting, diagnosed and treated for lumbar spine disease.   REVIEW OF SYSTEMS: Full 14 system review of systems performed and negative with exception of: As per HPI.  ALLERGIES: Allergies  Allergen Reactions  . Pumpkin Seed Hives and Swelling    Swelling of throat, tongue and lips. Swelling of throat, tongue and lips. Swelling of throat, tongue and lips.  . Mobic [Meloxicam] Nausea Only    "just didn't feel well"    HOME MEDICATIONS: Outpatient Medications Prior to Visit  Medication Sig Dispense Refill  . aspirin  EC 81 MG tablet Take 81 mg by mouth daily.    . chlorhexidine (PERIDEX) 0.12 % solution RINSE WITH 15 ML BID AFTER MEALS AND THEN SPIT OUT    . cholecalciferol (VITAMIN D) 1000 UNITS tablet Take 1,000 Units by mouth daily.    . colchicine 0.6 MG tablet Take two tabs once at the first sign of a gout flare and then one an hour later. Repeat in 24 hours if needed. (Patient not taking: Reported on 01/17/2019) 12 tablet 1  . Cyanocobalamin ER (B-12 DUAL SPECTRUM) 5000 MCG TBCR     . diclofenac (VOLTAREN) 75 MG EC tablet TAKE 1 TABLET BY MOUTH TWICE A DAY 60 tablet 1  . EPINEPHrine (AUVI-Q) 0.3 mg/0.3 mL IJ SOAJ injection Use as directed for severe allergic reaction. Appointment required for future refills. 2 Device 0  . HYDROcodone-acetaminophen (NORCO/VICODIN) 5-325 MG tablet Take 1 tablet by mouth every 6 (six) hours as needed for moderate pain. 15 tablet 0  . lisinopril (PRINIVIL,ZESTRIL) 20 MG tablet Take 1 tablet (20 mg total) by mouth daily. 90 tablet 2  . meloxicam (MOBIC) 7.5 MG tablet Take 1 tablet (7.5 mg total) by mouth daily. (Patient not taking: Reported on 01/17/2019) 30 tablet 0  . Multiple Vitamin (MULTIVITAMIN WITH MINERALS) TABS Take 1 tablet by mouth daily.    . sodium bicarbonate 325 MG tablet Take 325 mg by mouth 4 (four) times daily.    . tamsulosin (FLOMAX) 0.4 MG CAPS capsule  Take 1 tablet daily as needed for symptoms of kidney stone 15 capsule 1   Facility-Administered Medications Prior to Visit  Medication Dose Route Frequency Provider Last Rate Last Dose  . omalizumab Geoffry Paradise) injection 300 mg  300 mg Subcutaneous Q28 days Marcelyn Bruins, MD   300 mg at 12/27/18 3112    PAST MEDICAL HISTORY: Past Medical History:  Diagnosis Date  . Allergy   . Arthritis    right knee  . GERD (gastroesophageal reflux disease)   . History of kidney stones    multople stones   . Hypertension   . Kidney stone   . Sleep apnea     PAST SURGICAL HISTORY: Past Surgical  History:  Procedure Laterality Date  . EXTRACORPOREAL SHOCK WAVE LITHOTRIPSY Right 11/05/2016   Procedure: RIGHT EXTRACORPOREAL SHOCK WAVE LITHOTRIPSY (ESWL);  Surgeon: Malen Gauze, MD;  Location: [facility];  Service: Urology;  Laterality: Right;  . GANGLION CYST EXCISION  1989  . LAPAROSCOPIC GASTRIC BANDING    . NEVUS EXCISION Right 04/28/2017  . SPINE SURGERY      FAMILY HISTORY: Family History  Problem Relation Age of Onset  . Hypertension Mother   . Diabetes Father   . Cancer Maternal Grandmother   . Depression Maternal Grandfather   . Alzheimer's disease Paternal Grandmother   . Cancer Paternal Grandfather     SOCIAL HISTORY: Social History   Socioeconomic History  . Marital status: Single    Spouse name: Not on file  . Number of children: Not on file  . Years of education: Not on file  . Highest education level: Doctorate  Occupational History  . Not on file  Social Needs  . Financial resource strain: Not on file  . Food insecurity:    Worry: Not on file    Inability: Not on file  . Transportation needs:    Medical: Not on file    Non-medical: Not on file  Tobacco Use  . Smoking status: Never Smoker  . Smokeless tobacco: Never Used  Substance and Sexual Activity  . Alcohol use: No  . Drug use: No  . Sexual activity: Yes  Lifestyle  . Physical activity:    Days per week: Not on file    Minutes per session: Not on file  . Stress: Not on file  Relationships  . Social connections:    Talks on phone: Not on file    Gets together: Not on file    Attends religious service: Not on file    Active member of club or organization: Not on file    Attends meetings of clubs or organizations: Not on file    Relationship status: Not on file  . Intimate partner violence:    Fear of current or ex partner: Not on file    Emotionally abused: Not on file    Physically abused: Not on file    Forced sexual activity: Not on file  Other Topics Concern  . Not on file   Social History Narrative   Lives alone   Caffeine- varies 0-1     PHYSICAL EXAM  VIDEO EXAM  GENERAL EXAM/CONSTITUTIONAL:  Vitals: There were no vitals filed for this visit. There is no height or weight on file to calculate BMI. Wt Readings from Last 3 Encounters:  01/03/19 250 lb 3.2 oz (113.5 kg)  11/07/18 248 lb 3.2 oz (112.6 kg)  08/08/18 243 lb 9.6 oz (110.5 kg)    Patient is in no distress; well  developed, nourished and groomed; neck is supple   NEUROLOGIC: MENTAL STATUS:  No flowsheet data found.  awake, alert, oriented to person, place and time  recent and remote memory intact  normal attention and concentration  language fluent, comprehension intact, naming intact  fund of knowledge appropriate  CRANIAL NERVE:   2nd, 3rd, 4th, 6th - visual fields full to confrontation, extraocular muscles intact, no nystagmus  5th - facial sensation symmetric  7th - facial strength symmetric  8th - hearing intact  11th - shoulder shrug symmetric  12th - tongue protrusion midline  MOTOR:   NO TREMOR; NO DRIFT IN BUE  SENSORY:   normal and symmetric to light touch  COORDINATION:   fine finger movements normal    DIAGNOSTIC DATA (LABS, IMAGING, TESTING) - I reviewed patient records, labs, notes, testing and imaging myself where available.  Lab Results  Component Value Date   WBC 4.6 06/27/2018   HGB 13.7 06/27/2018   HCT 39.5 06/27/2018   MCV 87 06/27/2018   PLT 191 06/27/2018      Component Value Date/Time   NA 141 11/07/2018 0842   K 4.3 11/07/2018 0842   CL 105 11/07/2018 0842   CO2 21 11/07/2018 0842   GLUCOSE 103 (H) 11/07/2018 0842   GLUCOSE 115 (H) 11/15/2016 0716   BUN 10 11/07/2018 0842   CREATININE 1.00 11/07/2018 0842   CREATININE 1.05 01/24/2016 1342   CALCIUM 8.7 11/07/2018 0842   PROT 6.2 11/07/2018 0842   ALBUMIN 4.1 11/07/2018 0842   AST 30 11/07/2018 0842   ALT 45 (H) 11/07/2018 0842   ALKPHOS 117 11/07/2018 0842    BILITOT 0.5 11/07/2018 0842   GFRNONAA 85 11/07/2018 0842   GFRNONAA 82 01/24/2016 1342   GFRAA 98 11/07/2018 0842   GFRAA >89 01/24/2016 1342   Lab Results  Component Value Date   CHOL 137 06/27/2018   HDL 32 (L) 06/27/2018   LDLCALC 83 06/27/2018   TRIG 110 06/27/2018   CHOLHDL 4.3 06/27/2018   Lab Results  Component Value Date   HGBA1C 4.8 12/01/2016   Lab Results  Component Value Date   VITAMINB12 >2000 (H) 06/27/2018   No results found for: TSH   01/04/19 CT renal [I reviewed images myself and agree with interpretation. -VRP]  1. There are multiple small and punctuate nonobstructive calculi present bilaterally, the largest calculus measuring 3 mm in the inferior pole of the left kidney. No evidence of ureteral calculus or hydronephrosis. 2.  Diverticulosis without evidence of acute diverticulitis.   ASSESSMENT AND PLAN  53 y.o. year old male here with new onset headache, left arm weakness, language difficulty in March 2020, in the setting of extreme stress and insomnia.  We will proceed with further work-up.  Dx:  1. Left arm weakness   2. New onset headache     Virtual Visit via Video Note  I connected with Juan Nelson on 01/18/19 at 10:00 AM EDT by a video enabled telemedicine application and verified that I am speaking with the correct person using two identifiers.  Location: Patient: home  Provider: office   I discussed the limitations of evaluation and management by telemedicine and the availability of in person appointments. The patient expressed understanding and agreed to proceed.   I discussed the assessment and treatment plan with the patient. The patient was provided an opportunity to ask questions and all were answered. The patient agreed with the plan and demonstrated an understanding of the instructions.  The patient was advised to call back or seek an in-person evaluation if the symptoms worsen or if the condition fails to improve as  anticipated.  I provided 30 minutes of non-face-to-face time during this encounter.   PLAN:  - check MRI brain   Orders Placed This Encounter  Procedures  . MR BRAIN W WO CONTRAST   Return pending test results, for pending if symptoms worsen or fail to improve.    Suanne MarkerVIKRAM R. Janith Nielson, MD 01/18/2019, 10:36 AM Certified in Neurology, Neurophysiology and Neuroimaging  [facility] [address], [address] [place], [place] [address] [phone]

## 2019-01-18 NOTE — Telephone Encounter (Signed)
Refilled for #60, but we have not discussed this medication and use specifically recently.  Please schedule follow-up visit in the next month if possible to discuss further.  Should not be used with Mobic or other nsaids. Thanks.

## 2019-01-24 ENCOUNTER — Other Ambulatory Visit: Payer: Self-pay

## 2019-01-24 ENCOUNTER — Ambulatory Visit (INDEPENDENT_AMBULATORY_CARE_PROVIDER_SITE_OTHER): Payer: BLUE CROSS/BLUE SHIELD | Admitting: *Deleted

## 2019-01-24 DIAGNOSIS — L501 Idiopathic urticaria: Secondary | ICD-10-CM

## 2019-01-25 ENCOUNTER — Telehealth: Payer: Self-pay | Admitting: Diagnostic Neuroimaging

## 2019-01-25 NOTE — Telephone Encounter (Signed)
no to the covid-19 quetions MR Brain w/wo contrast Dr. Theresa Mulligan Auth: [id number] (exp. 01/25/19 to 04/24/19). Patient is scheduled at [facility] for 01/31/19.

## 2019-01-26 ENCOUNTER — Other Ambulatory Visit: Payer: Self-pay | Admitting: Family Medicine

## 2019-01-26 DIAGNOSIS — R109 Unspecified abdominal pain: Secondary | ICD-10-CM

## 2019-01-26 DIAGNOSIS — M79602 Pain in left arm: Secondary | ICD-10-CM

## 2019-01-26 DIAGNOSIS — R1032 Left lower quadrant pain: Secondary | ICD-10-CM

## 2019-01-26 DIAGNOSIS — R29898 Other symptoms and signs involving the musculoskeletal system: Secondary | ICD-10-CM

## 2019-01-28 NOTE — Telephone Encounter (Signed)
Mobic listed as allergy since his last visit.  Refill request was declined.  If this is a mistake, let me know and we can discuss potential refill and follow-up plan.

## 2019-01-30 ENCOUNTER — Telehealth: Payer: Self-pay | Admitting: Family Medicine

## 2019-01-30 NOTE — Telephone Encounter (Signed)
Copied from CRM [id number]. Topic: Quick Communication - See Telephone Encounter >> Jan 30, 2019  9:54 AM Aretta Nip wrote: CRM for notification. See Telephone encounter for: 01/30/19. PT returned call from [facility]. Wants a return call [phone] No crm

## 2019-01-30 NOTE — Telephone Encounter (Signed)
Left a msg on patient machine to give our office a call. When patient return call please find out if is allergic to meloxicam/mobic. This has been listed as a allergy. Therefore this medication was declined buy Dr Neva Seat due to he is allergic to this medication.

## 2019-01-31 ENCOUNTER — Ambulatory Visit (INDEPENDENT_AMBULATORY_CARE_PROVIDER_SITE_OTHER): Payer: BLUE CROSS/BLUE SHIELD

## 2019-01-31 ENCOUNTER — Other Ambulatory Visit: Payer: Self-pay

## 2019-01-31 DIAGNOSIS — R519 Headache, unspecified: Secondary | ICD-10-CM

## 2019-01-31 DIAGNOSIS — R51 Headache: Secondary | ICD-10-CM

## 2019-01-31 DIAGNOSIS — R29898 Other symptoms and signs involving the musculoskeletal system: Secondary | ICD-10-CM | POA: Diagnosis not present

## 2019-01-31 MED ORDER — GADOBENATE DIMEGLUMINE 529 MG/ML IV SOLN
20.0000 mL | Freq: Once | INTRAVENOUS | Status: AC | PRN
  Administered 2019-01-31: 20 mL via INTRAVENOUS

## 2019-02-01 ENCOUNTER — Telehealth: Payer: Self-pay | Admitting: *Deleted

## 2019-02-01 NOTE — Telephone Encounter (Signed)
Spoke with patient and informed him his MRI brain result is normal. He stated he is doing better. I advised he call for any questions or concerns. He  verbalized understanding, appreciation.

## 2019-02-21 ENCOUNTER — Ambulatory Visit (INDEPENDENT_AMBULATORY_CARE_PROVIDER_SITE_OTHER): Payer: BC Managed Care – PPO | Admitting: *Deleted

## 2019-02-21 DIAGNOSIS — L501 Idiopathic urticaria: Secondary | ICD-10-CM

## 2019-03-21 ENCOUNTER — Ambulatory Visit (INDEPENDENT_AMBULATORY_CARE_PROVIDER_SITE_OTHER): Payer: BC Managed Care – PPO | Admitting: *Deleted

## 2019-03-21 ENCOUNTER — Other Ambulatory Visit: Payer: Self-pay

## 2019-03-21 DIAGNOSIS — L501 Idiopathic urticaria: Secondary | ICD-10-CM | POA: Diagnosis not present

## 2019-04-13 DIAGNOSIS — N200 Calculus of kidney: Secondary | ICD-10-CM | POA: Diagnosis not present

## 2019-04-13 DIAGNOSIS — M549 Dorsalgia, unspecified: Secondary | ICD-10-CM | POA: Diagnosis not present

## 2019-04-17 ENCOUNTER — Ambulatory Visit (INDEPENDENT_AMBULATORY_CARE_PROVIDER_SITE_OTHER): Payer: BC Managed Care – PPO | Admitting: *Deleted

## 2019-04-17 DIAGNOSIS — L501 Idiopathic urticaria: Secondary | ICD-10-CM | POA: Diagnosis not present

## 2019-04-18 ENCOUNTER — Ambulatory Visit: Payer: Self-pay

## 2019-05-16 ENCOUNTER — Other Ambulatory Visit: Payer: Self-pay

## 2019-05-16 ENCOUNTER — Ambulatory Visit (INDEPENDENT_AMBULATORY_CARE_PROVIDER_SITE_OTHER): Payer: BC Managed Care – PPO | Admitting: *Deleted

## 2019-05-16 DIAGNOSIS — L501 Idiopathic urticaria: Secondary | ICD-10-CM | POA: Diagnosis not present

## 2019-06-12 ENCOUNTER — Other Ambulatory Visit: Payer: Self-pay

## 2019-06-12 ENCOUNTER — Ambulatory Visit (INDEPENDENT_AMBULATORY_CARE_PROVIDER_SITE_OTHER): Payer: BC Managed Care – PPO

## 2019-06-12 DIAGNOSIS — L501 Idiopathic urticaria: Secondary | ICD-10-CM | POA: Diagnosis not present

## 2019-06-13 DIAGNOSIS — D225 Melanocytic nevi of trunk: Secondary | ICD-10-CM | POA: Diagnosis not present

## 2019-06-13 DIAGNOSIS — D2261 Melanocytic nevi of right upper limb, including shoulder: Secondary | ICD-10-CM | POA: Diagnosis not present

## 2019-06-13 DIAGNOSIS — D1801 Hemangioma of skin and subcutaneous tissue: Secondary | ICD-10-CM | POA: Diagnosis not present

## 2019-06-13 DIAGNOSIS — D2262 Melanocytic nevi of left upper limb, including shoulder: Secondary | ICD-10-CM | POA: Diagnosis not present

## 2019-06-13 DIAGNOSIS — L570 Actinic keratosis: Secondary | ICD-10-CM | POA: Diagnosis not present

## 2019-06-22 ENCOUNTER — Encounter: Payer: Self-pay | Admitting: Family Medicine

## 2019-07-04 ENCOUNTER — Encounter: Payer: BLUE CROSS/BLUE SHIELD | Admitting: Family Medicine

## 2019-07-05 ENCOUNTER — Other Ambulatory Visit: Payer: Self-pay

## 2019-07-05 ENCOUNTER — Encounter: Payer: Self-pay | Admitting: Family Medicine

## 2019-07-05 ENCOUNTER — Other Ambulatory Visit: Payer: Self-pay | Admitting: Family Medicine

## 2019-07-05 ENCOUNTER — Ambulatory Visit (INDEPENDENT_AMBULATORY_CARE_PROVIDER_SITE_OTHER): Payer: BC Managed Care – PPO | Admitting: Family Medicine

## 2019-07-05 VITALS — BP 144/93 | HR 71 | Temp 97.8°F | Wt 253.2 lb

## 2019-07-05 DIAGNOSIS — Z131 Encounter for screening for diabetes mellitus: Secondary | ICD-10-CM

## 2019-07-05 DIAGNOSIS — Z6833 Body mass index (BMI) 33.0-33.9, adult: Secondary | ICD-10-CM | POA: Diagnosis not present

## 2019-07-05 DIAGNOSIS — R7989 Other specified abnormal findings of blood chemistry: Secondary | ICD-10-CM

## 2019-07-05 DIAGNOSIS — Z125 Encounter for screening for malignant neoplasm of prostate: Secondary | ICD-10-CM

## 2019-07-05 DIAGNOSIS — I10 Essential (primary) hypertension: Secondary | ICD-10-CM

## 2019-07-05 DIAGNOSIS — Z8739 Personal history of other diseases of the musculoskeletal system and connective tissue: Secondary | ICD-10-CM

## 2019-07-05 DIAGNOSIS — Z0000 Encounter for general adult medical examination without abnormal findings: Secondary | ICD-10-CM

## 2019-07-05 DIAGNOSIS — Z13220 Encounter for screening for lipoid disorders: Secondary | ICD-10-CM | POA: Diagnosis not present

## 2019-07-05 DIAGNOSIS — Z1329 Encounter for screening for other suspected endocrine disorder: Secondary | ICD-10-CM

## 2019-07-05 DIAGNOSIS — Z9884 Bariatric surgery status: Secondary | ICD-10-CM

## 2019-07-05 MED ORDER — LISINOPRIL 20 MG PO TABS: 20.0000 mg | ORAL_TABLET | Freq: Every day | ORAL | 2 refills | Status: DC

## 2019-07-05 MED ORDER — LISINOPRIL 10 MG PO TABS: 10.0000 mg | ORAL_TABLET | Freq: Every day | ORAL | 2 refills | Status: DC

## 2019-07-05 NOTE — Progress Notes (Signed)
Subjective:    Patient ID: Denna Haggard III, male    DOB: July 04, 1966, 53 y.o.   MRN: [id number]  HPI ACETON KINNEAR III is a 53 y.o. male Presents today for: Chief Complaint  Patient presents with  . Annual Exam    Annual Exam    Hypertension: BP Readings from Last 3 Encounters:  07/05/19 (!) 155/98  01/03/19 140/83  11/07/18 120/82   Lab Results  Component Value Date   CREATININE 1.00 11/07/2018   Lisinopril 20 mg daily.  Borderline at May visit, home readings:130s/80s.  No new side effects on that med.   Chronic idiopathic urticaria Followed by allergy and asthma center [place]. Treated with Xolair - has made a big difference.   Hyperglycemia with history of obesity Followed by [facility] for metabolic and weight loss surgery, history of lap band surgery, prior weight around 400.lost 200 # on own prior.  Wt Readings from Last 3 Encounters:  07/05/19 253 lb 3.2 oz (114.9 kg)  01/03/19 250 lb 3.2 oz (113.5 kg)  11/07/18 248 lb 3.2 oz (112.6 kg)  Body mass index is 33.41 kg/m. Lab Results  Component Value Date   HGBA1C 4.8 12/01/2016  some increased weight past few months. Some comfort eating, but still biking. Walking.  Considering gastric sleeve.  Rare color change of the skin between lower abdominal pannus with some discomfort in the area but again rare symptoms.  Doing well currently  Lab Results  Component Value Date   ALT 45 (H) 11/07/2018   AST 30 11/07/2018   ALKPHOS 117 11/07/2018   BILITOT 0.5 11/07/2018     History of nephrolithiasis, chronic kidney disease. Appointment October 2019 at [facility]. Minimizing NSAIDs have been discussed in the past. Only taking as needed - only once per month.   Gout: Last flare:1 month ago. About 10 flares in past year. Usually R foot.  Daily meds: none Prn med: diclofenac.  Has colchicine if needed Lab Results  Component Value Date   LABURIC 7.1 03/25/2018   Cancer screening:  Colonoscopy 09/03/2014 PSA 0.7  In 05/2018.agrees to PSA only after R/B discussion.  Derm: derm froze precancerous lesion in the summer.   Immunization History  Administered Date(s) Administered  . Influenza,inj,Quad PF,6+ Mos 06/03/2017  . Influenza-Unspecified 05/27/2015, 05/01/2016, 05/21/2018  . Tdap 06/03/2017   Depression screen [facility] 2/9 07/05/2019 01/03/2019 11/07/2018 08/08/2018 06/27/2018  Decreased Interest 0 0 0 0 0  Down, Depressed, Hopeless 0 0 0 0 0  PHQ - 2 Score 0 0 0 0 0    Hearing Screening   125Hz  250Hz  500Hz  1000Hz  2000Hz  3000Hz  4000Hz  6000Hz  8000Hz   Right ear:           Left ear:             Visual Acuity Screening   Right eye Left eye Both eyes  Without correction: 20/30 20/13 20/15   With correction:     optho in January.   Dental: Every 6 months.   Exercise:  Walking or biking 5 days per week. 45-75 min per episode.   Lab Results  Component Value Date   CHOL 137 06/27/2018   HDL 32 (L) 06/27/2018   LDLCALC 83 06/27/2018   TRIG 110 06/27/2018   CHOLHDL 4.3 06/27/2018    Patient Active Problem List   Diagnosis Date Noted  . Other seasonal allergic rhinitis 03/17/2017  . History of kidney stones 04/18/2014  . Essential hypertension 04/18/2014  . LFT elevation  10/08/2012  . Hyperglycemia 10/08/2012   Past Medical History:  Diagnosis Date  . Allergy   . Arthritis    right knee  . GERD (gastroesophageal reflux disease)   . History of kidney stones    multople stones   . Hypertension   . Kidney stone   . Sleep apnea    Past Surgical History:  Procedure Laterality Date  . EXTRACORPOREAL SHOCK WAVE LITHOTRIPSY Right 11/05/2016   Procedure: RIGHT EXTRACORPOREAL SHOCK WAVE LITHOTRIPSY (ESWL);  Surgeon: Malen Gauze, MD;  Location: [facility];  Service: Urology;  Laterality: Right;  . GANGLION CYST EXCISION  1989  . LAPAROSCOPIC GASTRIC BANDING    . NEVUS EXCISION Right 04/28/2017  . SPINE SURGERY     Allergies  Allergen Reactions  . Pumpkin  Seed Hives and Swelling    Swelling of throat, tongue and lips. Swelling of throat, tongue and lips. Swelling of throat, tongue and lips.  . Mobic [Meloxicam] Nausea Only    "just didn't feel well"   Prior to Admission medications   Medication Sig Start Date End Date Taking? Authorizing Provider  aspirin EC 81 MG tablet Take 81 mg by mouth daily.   Yes [provider]  chlorhexidine (PERIDEX) 0.12 % solution RINSE WITH 15 ML BID AFTER MEALS AND THEN SPIT OUT 08/10/18  Yes [provider]  cholecalciferol (VITAMIN D) 1000 UNITS tablet Take 1,000 Units by mouth daily.   Yes [provider]  colchicine 0.6 MG tablet Take two tabs once at the first sign of a gout flare and then one an hour later. Repeat in 24 hours if needed. 08/16/17  Yes Ofilia Neas, PA-C  Cyanocobalamin ER (B-12 DUAL SPECTRUM) 5000 MCG TBCR  09/01/15  Yes [provider]  cyclobenzaprine (FLEXERIL) 5 MG tablet Take 5 mg by mouth 3 (three) times daily as needed for muscle spasms.   Yes [provider]  diclofenac (VOLTAREN) 75 MG EC tablet TAKE 1 TABLET BY MOUTH TWICE A DAY 01/18/19  Yes Shade Flood, MD  EPINEPHrine (AUVI-Q) 0.3 mg/0.3 mL IJ SOAJ injection Use as directed for severe allergic reaction. Appointment required for future refills. 09/22/18  Yes Alfonse Spruce, MD  HYDROcodone-acetaminophen (NORCO/VICODIN) 5-325 MG tablet Take 1 tablet by mouth every 6 (six) hours as needed for moderate pain. 01/03/19  Yes Shade Flood, MD  lisinopril (PRINIVIL,ZESTRIL) 20 MG tablet Take 1 tablet (20 mg total) by mouth daily. 11/07/18  Yes Shade Flood, MD  Multiple Vitamin (MULTIVITAMIN WITH MINERALS) TABS Take 1 tablet by mouth daily.   Yes [provider]  sodium bicarbonate 325 MG tablet Take 325 mg by mouth 4 (four) times daily.   Yes [provider]  tamsulosin (FLOMAX) 0.4 MG CAPS capsule Take 1 tablet daily as needed for symptoms of kidney stone  11/15/16  Yes Dione Booze, MD   Social History   Socioeconomic History  . Marital status: Single    Spouse name: Not on file  . Number of children: Not on file  . Years of education: Not on file  . Highest education level: Doctorate  Occupational History  . Not on file  Social Needs  . Financial resource strain: Not on file  . Food insecurity    Worry: Not on file    Inability: Not on file  . Transportation needs    Medical: Not on file    Non-medical: Not on file  Tobacco Use  . Smoking status: Never Smoker  .  Smokeless tobacco: Never Used  Substance and Sexual Activity  . Alcohol use: No  . Drug use: No  . Sexual activity: Yes  Lifestyle  . Physical activity    Days per week: Not on file    Minutes per session: Not on file  . Stress: Not on file  Relationships  . Social Musician on phone: Not on file    Gets together: Not on file    Attends religious service: Not on file    Active member of club or organization: Not on file    Attends meetings of clubs or organizations: Not on file    Relationship status: Not on file  . Intimate partner violence    Fear of current or ex partner: Not on file    Emotionally abused: Not on file    Physically abused: Not on file    Forced sexual activity: Not on file  Other Topics Concern  . Not on file  Social History Narrative   Lives alone   Caffeine- varies 0-1    Review of Systems 13 point review of systems per patient health survey noted.  Negative other than as indicated above or in HPI.       Objective:   Physical Exam Vitals signs reviewed.  Constitutional:      Appearance: He is well-developed.  HENT:     Head: Normocephalic and atraumatic.     Right Ear: External ear normal.     Left Ear: External ear normal.  Eyes:     Conjunctiva/sclera: Conjunctivae normal.     Pupils: Pupils are equal, round, and reactive to light.  Neck:     Musculoskeletal: Normal range of motion and neck supple.      Thyroid: No thyromegaly.  Cardiovascular:     Rate and Rhythm: Normal rate and regular rhythm.     Heart sounds: Normal heart sounds.  Pulmonary:     Effort: Pulmonary effort is normal. No respiratory distress.     Breath sounds: Normal breath sounds. No wheezing.  Abdominal:     General: There is no distension.     Palpations: Abdomen is soft.     Tenderness: There is no abdominal tenderness.     Hernia: There is no hernia in the left inguinal area or right inguinal area.  Musculoskeletal: Normal range of motion.        General: No tenderness.  Lymphadenopathy:     Cervical: No cervical adenopathy.  Skin:    General: Skin is warm and dry.  Neurological:     Mental Status: He is alert and oriented to person, place, and time.     Deep Tendon Reflexes: Reflexes are normal and symmetric.  Psychiatric:        Behavior: Behavior normal.    Vitals:   07/05/19 0900 07/05/19 0959  BP: (!) 155/98 (!) 144/93  Pulse: 71   Temp: 97.8 F (36.6 C)   TempSrc: Oral   SpO2: 98%   Weight: 253 lb 3.2 oz (114.9 kg)   Body mass index is 33.41 kg/m.      Assessment & Plan:    CEPHAS REVARD III is a 53 y.o. male Annual physical exam  - -anticipatory guidance as below in AVS, screening labs above. Health maintenance items as above in HPI discussed/recommended as applicable.   BMI 33.0-33.9,adult - Plan: Hemoglobin A1c, TSH Personal history of gastric banding - Plan: Hemoglobin A1c Screening for diabetes mellitus - Plan: Hemoglobin A1c  -  Commended on exercise.  Possible diet changes.  Plan is to consider gastric sleeve with previous surgeon.  Screen for diabetes with A1c, and check TSH . Essential hypertension - Plan: Comprehensive metabolic panel, Lipid panel, lisinopril (ZESTRIL) 10 MG tablet, lisinopril (ZESTRIL) 20 MG tablet  -Decreased control, add additional lisinopril 10 mg daily, monitor home readings.  Recheck 6 weeks  Screening for thyroid disorder - Plan: TSH  Screening  for malignant neoplasm of prostate - Plan: PSA  - We discussed pros and cons of prostate cancer screening, and after this discussion, he chose to have screening done.  With PSA only, limitations and partial testing discussed  Screening for hyperlipidemia - Plan: Lipid panel  Elevated LFTs - Plan: Comprehensive metabolic panel  -Repeat testing, most recent testing looked okay with borderline elevated ALT  History of gout - Plan: Uric Acid  -Frequent reported flares over the past year.  Repeat uric acid and if over 6 would consider allopurinol once per day.  Meds ordered this encounter  Medications  . lisinopril (ZESTRIL) 10 MG tablet    Sig: Take 1 tablet (10 mg total) by mouth daily.    Dispense:  90 tablet    Refill:  2  . lisinopril (ZESTRIL) 20 MG tablet    Sig: Take 1 tablet (20 mg total) by mouth daily.    Dispense:  90 tablet    Refill:  2   Patient Instructions   If uric acid level over 6 - I recommend allopurinol once per day to lessen flares of gout.   Color change between folds of skin could be a component of intertrigo.  See information below on that condition.  If that does happen, especially with pain, I am happy to see you and we can evaluate for other treatment.  Continue to monitor blood pressure, but additional 10 mg of lisinopril should help.  Recheck in 6 weeks for blood pressure, and possibly repeating blood work for kidney function.    Good luck with your weight changes.  Watching diet with comfort foods as a treat only should help.  Keep up the great work with exercise.  Please let me know if I can help further.  Follow-up in 6 weeks for medication review. Thanks for coming in today!      Intertrigo Intertrigo is skin irritation or inflammation (dermatitis) that occurs when folds of skin rub together. The irritation can cause a rash and make skin raw and itchy. This condition most commonly occurs in the skin folds of these areas:  Toes.  Armpits.   Groin.  Under the belly.  Under the breasts.  Buttocks. Intertrigo is not passed from person to person (is not contagious). What are the causes? This condition is caused by heat, moisture, rubbing (friction), and not enough air circulation. The condition can be made worse by:  Sweat.  Bacteria.  A fungus, such as yeast. What increases the risk? This condition is more likely to occur if you have moisture in your skin folds. You are more likely to develop this condition if you:  Have diabetes.  Are overweight.  Are not able to move around or are not active.  Live in a warm and moist climate.  Wear splints, braces, or other medical devices.  Are not able to control your bowels or bladder (have incontinence). What are the signs or symptoms? Symptoms of this condition include:  A pink or red skin rash in the skin fold or near the skin fold.  Raw or scaly skin.  Itchiness.  A burning feeling.  Bleeding.  Leaking fluid.  A bad smell. How is this diagnosed? This condition is diagnosed with a medical history and physical exam. You may also have a skin swab to test for bacteria or a fungus. How is this treated? This condition may be treated by:  Cleaning and drying your skin.  Taking an antibiotic medicine or using an antibiotic skin cream for a bacterial infection.  Using an antifungal cream on your skin or taking pills for an infection that was caused by a fungus, such as yeast.  Using a steroid ointment to relieve itchiness and irritation.  Separating the skin fold with a clean cotton cloth to absorb moisture and allow air to flow into the area. Follow these instructions at home:  Keep the affected area clean and dry.  Do not scratch your skin.  Stay in a cool environment as much as possible. Use an air conditioner or fan, if available.  Apply over-the-counter and prescription medicines only as told by your health care provider.  If you were prescribed  an antibiotic medicine, use it as told by your health care provider. Do not stop using the antibiotic even if your condition improves.  Keep all follow-up visits as told by your health care provider. This is important. How is this prevented?   Maintain a healthy weight.  Take care of your feet, especially if you have diabetes. Foot care includes: ? Wearing shoes that fit well. ? Keeping your feet dry. ? Wearing clean, breathable socks.  Protect the skin around your groin and buttocks, especially if you have incontinence. Skin protection includes: ? Following a regular cleaning routine. ? Using skin protectant creams, powders, or ointments. ? Changing protection pads frequently.  Do not wear tight clothes. Wear clothes that are loose, absorbent, and made of cotton.  Wear a bra that gives good support, if needed.  Shower and dry yourself well after activity or exercise. Use a hair dryer on a cool setting to dry between skin folds, especially after you bathe.  If you have diabetes, keep your blood sugar under control. Contact a health care provider if:  Your symptoms do not improve with treatment.  Your symptoms get worse or they spread.  You notice increased redness and warmth.  You have a fever. Summary  Intertrigo is skin irritation or inflammation (dermatitis) that occurs when folds of skin rub together.  This condition is caused by heat, moisture, rubbing (friction), and not enough air circulation.  This condition may be treated by cleaning and drying your skin and with medicines.  Apply over-the-counter and prescription medicines only as told by your health care provider.  Keep all follow-up visits as told by your health care provider. This is important. This information is not intended to replace advice given to you by your health care provider. Make sure you discuss any questions you have with your health care provider. Document Released: 08/17/2005 Document  Revised: 01/17/2018 Document Reviewed: 01/17/2018 Elsevier Patient Education  2020 [employer].  Keeping you healthy  Get these tests  Blood pressure- Have your blood pressure checked once a year by your healthcare provider.  Normal blood pressure is 120/80  Weight- Have your body mass index (BMI) calculated to screen for obesity.  BMI is a measure of body fat based on height and weight. You can also calculate your own BMI at [email or web address].  Cholesterol- Have your cholesterol checked every year.  Diabetes- Have your  blood sugar checked regularly if you have high blood pressure, high cholesterol, have a family history of diabetes or if you are overweight.  Screening for Colon Cancer- Colonoscopy starting at age 35.  Screening may begin sooner depending on your family history and other health conditions. Follow up colonoscopy as directed by your Gastroenterologist.  Screening for Prostate Cancer- Both blood work (PSA) and a rectal exam help screen for Prostate Cancer.  Screening begins at age 25 with African-American men and at age 73 with Caucasian men.  Screening may begin sooner depending on your family history.  Take these medicines  Aspirin- One aspirin daily can help prevent Heart disease and Stroke.  Flu shot- Every fall.  Tetanus- Every 10 years.  Zostavax- Once after the age of 49 to prevent Shingles.  Pneumonia shot- Once after the age of 50; if you are younger than 75, ask your healthcare provider if you need a Pneumonia shot.  Take these steps  Don't smoke- If you do smoke, talk to your doctor about quitting.  For tips on how to quit, go to www.smokefree.gov or call 1-800-QUIT-NOW.  Be physically active- Exercise 5 days a week for at least 30 minutes.  If you are not already physically active start slow and gradually work up to 30 minutes of moderate physical activity.  Examples of moderate activity include walking briskly, mowing the yard, dancing, swimming,  bicycling, etc.  Eat a healthy diet- Eat a variety of healthy food such as fruits, vegetables, low fat milk, low fat cheese, yogurt, lean meant, poultry, fish, beans, tofu, etc. For more information go to www.thenutritionsource.org  Drink alcohol in moderation- Limit alcohol intake to less than two drinks a day. Never drink and drive.  Dentist- Brush and floss twice daily; visit your dentist twice a year.  Depression- Your emotional health is as important as your physical health. If you're feeling down, or losing interest in things you would normally enjoy please talk to your healthcare provider.  Eye exam- Visit your eye doctor every year.  Safe sex- If you may be exposed to a sexually transmitted infection, use a condom.  Seat belts- Seat belts can save your life; always wear one.  Smoke/Carbon Monoxide detectors- These detectors need to be installed on the appropriate level of your home.  Replace batteries at least once a year.  Skin cancer- When out in the sun, cover up and use sunscreen 15 SPF or higher.  Violence- If anyone is threatening you, please tell your healthcare provider.  Living Will/ Health care power of attorney- Speak with your healthcare provider and family.  If you have lab work done today you will be contacted with your lab results within the next 2 weeks.  If you have not heard from [place] then please contact us. The fastest way to get your results is to register for My Chart.   IF you received an x-ray today, you will receive an invoice from [facility]. Please contact [facility] at [phone] with questions or concerns regarding your invoice.   IF you received labwork today, you will receive an invoice from [facility]. Please contact [facility] at [phone] with questions or concerns regarding your invoice.   Our billing staff will not be able to assist you with questions regarding bills from these companies.  You will be contacted with the  lab results as soon as they are available. The fastest way to get your results is to activate your My Chart account. Instructions are located on  the last page of this paperwork. If you have not heard from us regarding the results in 2 weeks, please contact this office.       Signed,   Meredith StaggersJeffrey Leonidas Boateng, MD [facility].  07/05/19 10:08 AM

## 2019-07-05 NOTE — Telephone Encounter (Signed)
Requested medication (s) are due for refill today: yes  Requested medication (s) are on the active medication list: yes  Last refill:  06/11/2019  Future visit scheduled: yes  Notes to clinic:  Patient has appointment today    Requested Prescriptions  Pending Prescriptions Disp Refills   lisinopril (ZESTRIL) 20 MG tablet [Pharmacy Med Name: LISINOPRIL 20 MG TABLET] 30 tablet 8    Sig: TAKE 1 TABLET BY MOUTH EVERY DAY     Cardiovascular:  ACE Inhibitors Failed - 07/05/2019  2:57 AM      Failed - Cr in normal range and within 180 days    Creat  Date Value Ref Range Status  01/24/2016 1.05 0.70 - 1.33 mg/dL Final   Creatinine, Ser  Date Value Ref Range Status  11/07/2018 1.00 0.76 - 1.27 mg/dL Final         Failed - K in normal range and within 180 days    Potassium  Date Value Ref Range Status  11/07/2018 4.3 3.5 - 5.2 mmol/L Final         Failed - Last BP in normal range    BP Readings from Last 1 Encounters:  01/03/19 140/83         Failed - Valid encounter within last 6 months    Recent Outpatient Visits          6 months ago Flank pain   [facility] [place], [place]   8 months ago Essential hypertension   [facility], Ranell Patrick, MD   11 months ago Essential hypertension   [facility], Ranell Patrick, MD   1 year ago Hearing loss of left ear, unspecified hearing loss type   [facility], Ranell Patrick, MD   1 year ago Essential hypertension   [facility] at [facility], Gelene Mink, [place]      Future Appointments            Today Juan Nelson Ranell Patrick, MD [facility] at [place], [facility]   In 2 months Valentina Shaggy, MD Allergy and [place] - Patient is not pregnant

## 2019-07-05 NOTE — Patient Instructions (Addendum)
If uric acid level over 6 - I recommend allopurinol once per day to lessen flares of gout.   Color change between folds of skin could be a component of intertrigo.  See information below on that condition.  If that does happen, especially with pain, I am happy to see you and we can evaluate for other treatment.  Continue to monitor blood pressure, but additional 10 mg of lisinopril should help.  Recheck in 6 weeks for blood pressure, and possibly repeating blood work for kidney function.    Good luck with your weight changes.  Watching diet with comfort foods as a treat only should help.  Keep up the great work with exercise.  Please let me know if I can help further.  Follow-up in 6 weeks for medication review. Thanks for coming in today!      Intertrigo Intertrigo is skin irritation or inflammation (dermatitis) that occurs when folds of skin rub together. The irritation can cause a rash and make skin raw and itchy. This condition most commonly occurs in the skin folds of these areas:  Toes.  Armpits.  Groin.  Under the belly.  Under the breasts.  Buttocks. Intertrigo is not passed from person to person (is not contagious). What are the causes? This condition is caused by heat, moisture, rubbing (friction), and not enough air circulation. The condition can be made worse by:  Sweat.  Bacteria.  A fungus, such as yeast. What increases the risk? This condition is more likely to occur if you have moisture in your skin folds. You are more likely to develop this condition if you:  Have diabetes.  Are overweight.  Are not able to move around or are not active.  Live in a warm and moist climate.  Wear splints, braces, or other medical devices.  Are not able to control your bowels or bladder (have incontinence). What are the signs or symptoms? Symptoms of this condition include:  A pink or red skin rash in the skin fold or near the skin fold.  Raw or scaly  skin.  Itchiness.  A burning feeling.  Bleeding.  Leaking fluid.  A bad smell. How is this diagnosed? This condition is diagnosed with a medical history and physical exam. You may also have a skin swab to test for bacteria or a fungus. How is this treated? This condition may be treated by:  Cleaning and drying your skin.  Taking an antibiotic medicine or using an antibiotic skin cream for a bacterial infection.  Using an antifungal cream on your skin or taking pills for an infection that was caused by a fungus, such as yeast.  Using a steroid ointment to relieve itchiness and irritation.  Separating the skin fold with a clean cotton cloth to absorb moisture and allow air to flow into the area. Follow these instructions at home:  Keep the affected area clean and dry.  Do not scratch your skin.  Stay in a cool environment as much as possible. Use an air conditioner or fan, if available.  Apply over-the-counter and prescription medicines only as told by your health care provider.  If you were prescribed an antibiotic medicine, use it as told by your health care provider. Do not stop using the antibiotic even if your condition improves.  Keep all follow-up visits as told by your health care provider. This is important. How is this prevented?   Maintain a healthy weight.  Take care of your feet, especially if you have diabetes.  Foot care includes: ? Wearing shoes that fit well. ? Keeping your feet dry. ? Wearing clean, breathable socks.  Protect the skin around your groin and buttocks, especially if you have incontinence. Skin protection includes: ? Following a regular cleaning routine. ? Using skin protectant creams, powders, or ointments. ? Changing protection pads frequently.  Do not wear tight clothes. Wear clothes that are loose, absorbent, and made of cotton.  Wear a bra that gives good support, if needed.  Shower and dry yourself well after activity or  exercise. Use a hair dryer on a cool setting to dry between skin folds, especially after you bathe.  If you have diabetes, keep your blood sugar under control. Contact a health care provider if:  Your symptoms do not improve with treatment.  Your symptoms get worse or they spread.  You notice increased redness and warmth.  You have a fever. Summary  Intertrigo is skin irritation or inflammation (dermatitis) that occurs when folds of skin rub together.  This condition is caused by heat, moisture, rubbing (friction), and not enough air circulation.  This condition may be treated by cleaning and drying your skin and with medicines.  Apply over-the-counter and prescription medicines only as told by your health care provider.  Keep all follow-up visits as told by your health care provider. This is important. This information is not intended to replace advice given to you by your health care provider. Make sure you discuss any questions you have with your health care provider. Document Released: 08/17/2005 Document Revised: 01/17/2018 Document Reviewed: 01/17/2018 Elsevier Patient Education  2020 [employer].  Keeping you healthy  Get these tests  Blood pressure- Have your blood pressure checked once a year by your healthcare provider.  Normal blood pressure is 120/80  Weight- Have your body mass index (BMI) calculated to screen for obesity.  BMI is a measure of body fat based on height and weight. You can also calculate your own BMI at [email or web address].  Cholesterol- Have your cholesterol checked every year.  Diabetes- Have your blood sugar checked regularly if you have high blood pressure, high cholesterol, have a family history of diabetes or if you are overweight.  Screening for Colon Cancer- Colonoscopy starting at age 25.  Screening may begin sooner depending on your family history and other health conditions. Follow up colonoscopy as directed by your  Gastroenterologist.  Screening for Prostate Cancer- Both blood work (PSA) and a rectal exam help screen for Prostate Cancer.  Screening begins at age 22 with African-American men and at age 37 with Caucasian men.  Screening may begin sooner depending on your family history.  Take these medicines  Aspirin- One aspirin daily can help prevent Heart disease and Stroke.  Flu shot- Every fall.  Tetanus- Every 10 years.  Zostavax- Once after the age of 49 to prevent Shingles.  Pneumonia shot- Once after the age of 14; if you are younger than 67, ask your healthcare provider if you need a Pneumonia shot.  Take these steps  Don't smoke- If you do smoke, talk to your doctor about quitting.  For tips on how to quit, go to www.smokefree.gov or call 1-800-QUIT-NOW.  Be physically active- Exercise 5 days a week for at least 30 minutes.  If you are not already physically active start slow and gradually work up to 30 minutes of moderate physical activity.  Examples of moderate activity include walking briskly, mowing the yard, dancing, swimming, bicycling, etc.  Eat a healthy diet- Eat a  variety of healthy food such as fruits, vegetables, low fat milk, low fat cheese, yogurt, lean meant, poultry, fish, beans, tofu, etc. For more information go to www.thenutritionsource.org  Drink alcohol in moderation- Limit alcohol intake to less than two drinks a day. Never drink and drive.  Dentist- Brush and floss twice daily; visit your dentist twice a year.  Depression- Your emotional health is as important as your physical health. If you're feeling down, or losing interest in things you would normally enjoy please talk to your healthcare provider.  Eye exam- Visit your eye doctor every year.  Safe sex- If you may be exposed to a sexually transmitted infection, use a condom.  Seat belts- Seat belts can save your life; always wear one.  Smoke/Carbon Monoxide detectors- These detectors need to be installed on  the appropriate level of your home.  Replace batteries at least once a year.  Skin cancer- When out in the sun, cover up and use sunscreen 15 SPF or higher.  Violence- If anyone is threatening you, please tell your healthcare provider.  Living Will/ Health care power of attorney- Speak with your healthcare provider and family.  If you have lab work done today you will be contacted with your lab results within the next 2 weeks.  If you have not heard from us then please contact us. The fastest way to get your results is to register for My Chart.   IF you received an x-ray today, you will receive an invoice from [facility]. Please contact [facility] at [phone] with questions or concerns regarding your invoice.   IF you received labwork today, you will receive an invoice from [facility]. Please contact [facility] at [phone] with questions or concerns regarding your invoice.   Our billing staff will not be able to assist you with questions regarding bills from these companies.  You will be contacted with the lab results as soon as they are available. The fastest way to get your results is to activate your My Chart account. Instructions are located on the last page of this paperwork. If you have not heard from us regarding the results in 2 weeks, please contact this office.

## 2019-07-06 LAB — COMPREHENSIVE METABOLIC PANEL
ALT: 56 IU/L — ABNORMAL HIGH (ref 0–44)
AST: 36 IU/L (ref 0–40)
Albumin/Globulin Ratio: 2 (ref 1.2–2.2)
Albumin: 4.2 g/dL (ref 3.8–4.9)
Alkaline Phosphatase: 130 IU/L — ABNORMAL HIGH (ref 39–117)
BUN/Creatinine Ratio: 7 — ABNORMAL LOW (ref 9–20)
BUN: 7 mg/dL (ref 6–24)
Bilirubin Total: 0.4 mg/dL (ref 0.0–1.2)
CO2: 21 mmol/L (ref 20–29)
Calcium: 8.9 mg/dL (ref 8.7–10.2)
Chloride: 105 mmol/L (ref 96–106)
Creatinine, Ser: 0.95 mg/dL (ref 0.76–1.27)
GFR calc Af Amer: 104 mL/min/{1.73_m2} (ref >59)
GFR calc non Af Amer: 90 mL/min/{1.73_m2} (ref >59)
Globulin, Total: 2.1 g/dL (ref 1.5–4.5)
Glucose: 108 mg/dL — ABNORMAL HIGH (ref 65–99)
Potassium: 4.3 mmol/L (ref 3.5–5.2)
Sodium: 141 mmol/L (ref 134–144)
Total Protein: 6.3 g/dL (ref 6.0–8.5)

## 2019-07-06 LAB — LIPID PANEL
Chol/HDL Ratio: 4.3 ratio (ref 0.0–5.0)
Cholesterol, Total: 132 mg/dL (ref 100–199)
HDL: 31 mg/dL — ABNORMAL LOW (ref >39)
LDL Chol Calc (NIH): 76 mg/dL (ref 0–99)
Triglycerides: 144 mg/dL (ref 0–149)
VLDL Cholesterol Cal: 25 mg/dL (ref 5–40)

## 2019-07-06 LAB — HEMOGLOBIN A1C
Est. average glucose Bld gHb Est-mCnc: 114 mg/dL
Hgb A1c MFr Bld: 5.6 % (ref 4.8–5.6)

## 2019-07-06 LAB — URIC ACID: Uric Acid: 7.7 mg/dL (ref 3.7–8.6)

## 2019-07-06 LAB — TSH: TSH: 1.47 u[IU]/mL (ref 0.450–4.500)

## 2019-07-06 LAB — PSA: Prostate Specific Ag, Serum: 0.6 ng/mL (ref 0.0–4.0)

## 2019-07-09 ENCOUNTER — Encounter: Payer: Self-pay | Admitting: Family Medicine

## 2019-07-10 ENCOUNTER — Ambulatory Visit (INDEPENDENT_AMBULATORY_CARE_PROVIDER_SITE_OTHER): Payer: BC Managed Care – PPO

## 2019-07-10 ENCOUNTER — Other Ambulatory Visit: Payer: Self-pay

## 2019-07-10 DIAGNOSIS — L501 Idiopathic urticaria: Secondary | ICD-10-CM | POA: Diagnosis not present

## 2019-07-11 DIAGNOSIS — H903 Sensorineural hearing loss, bilateral: Secondary | ICD-10-CM | POA: Diagnosis not present

## 2019-07-11 DIAGNOSIS — H6123 Impacted cerumen, bilateral: Secondary | ICD-10-CM | POA: Diagnosis not present

## 2019-07-12 MED ORDER — ALLOPURINOL 100 MG PO TABS: 100.0000 mg | ORAL_TABLET | Freq: Every day | ORAL | 1 refills | Status: DC

## 2019-07-12 NOTE — Telephone Encounter (Signed)
rx sent

## 2019-08-01 DIAGNOSIS — K7581 Nonalcoholic steatohepatitis (NASH): Secondary | ICD-10-CM | POA: Diagnosis not present

## 2019-08-07 ENCOUNTER — Ambulatory Visit (INDEPENDENT_AMBULATORY_CARE_PROVIDER_SITE_OTHER): Payer: BC Managed Care – PPO

## 2019-08-07 ENCOUNTER — Other Ambulatory Visit: Payer: Self-pay

## 2019-08-07 DIAGNOSIS — L501 Idiopathic urticaria: Secondary | ICD-10-CM | POA: Diagnosis not present

## 2019-08-16 ENCOUNTER — Ambulatory Visit: Payer: BC Managed Care – PPO | Admitting: Family Medicine

## 2019-08-31 ENCOUNTER — Other Ambulatory Visit: Payer: Self-pay | Admitting: *Deleted

## 2019-08-31 MED ORDER — EPINEPHRINE 0.3 MG/0.3ML IJ SOAJ: 0.3000 mg | Freq: Once | INTRAMUSCULAR | 1 refills | Status: AC

## 2019-09-05 ENCOUNTER — Other Ambulatory Visit: Payer: Self-pay

## 2019-09-05 ENCOUNTER — Ambulatory Visit: Payer: Self-pay

## 2019-09-05 ENCOUNTER — Ambulatory Visit (INDEPENDENT_AMBULATORY_CARE_PROVIDER_SITE_OTHER): Payer: BC Managed Care – PPO

## 2019-09-05 DIAGNOSIS — L501 Idiopathic urticaria: Secondary | ICD-10-CM

## 2019-09-07 ENCOUNTER — Encounter: Payer: Self-pay | Admitting: Family Medicine

## 2019-09-07 ENCOUNTER — Ambulatory Visit (INDEPENDENT_AMBULATORY_CARE_PROVIDER_SITE_OTHER): Payer: BC Managed Care – PPO | Admitting: Family Medicine

## 2019-09-07 ENCOUNTER — Other Ambulatory Visit: Payer: Self-pay

## 2019-09-07 VITALS — BP 132/100 | HR 77 | Temp 98.3°F | Ht 73.0 in | Wt 257.4 lb

## 2019-09-07 DIAGNOSIS — Z8739 Personal history of other diseases of the musculoskeletal system and connective tissue: Secondary | ICD-10-CM | POA: Diagnosis not present

## 2019-09-07 DIAGNOSIS — I10 Essential (primary) hypertension: Secondary | ICD-10-CM | POA: Diagnosis not present

## 2019-09-07 MED ORDER — LISINOPRIL 40 MG PO TABS: 40.0000 mg | ORAL_TABLET | Freq: Every day | ORAL | 1 refills | Status: DC

## 2019-09-07 NOTE — Patient Instructions (Addendum)
Lisinopril increased to a total of 40 mg each day.  Let me know if you have any new side effects on that dose.  Continue allopurinol for gout, recheck in 3 months for repeat testing.  Let me know if there are any questions sooner.   If you have lab work done today you will be contacted with your lab results within the next 2 weeks.  If you have not heard from [place] then please contact us. The fastest way to get your results is to register for My Chart.   IF you received an x-ray today, you will receive an invoice from [facility]. Please contact [facility] at [phone] with questions or concerns regarding your invoice.   IF you received labwork today, you will receive an invoice from [place]. Please contact [facility] at [phone] with questions or concerns regarding your invoice.   Our billing staff will not be able to assist you with questions regarding bills from these companies.  You will be contacted with the lab results as soon as they are available. The fastest way to get your results is to activate your My Chart account. Instructions are located on the last page of this paperwork. If you have not heard from [place] regarding the results in 2 weeks, please contact this office.

## 2019-09-07 NOTE — Progress Notes (Signed)
Subjective:  Patient ID: Juan Nelson, male    DOB: 30-Dec-1965  Age: 54 y.o. MRN: [id number]  CC:  Chief Complaint  Patient presents with  . Follow-up    on BP. pt states he has been doing well controling it. home bp check rang sitalic 130-140s and distalic rangs has been in the 80s.    HPI Juan Nelson presents for   Hypertension: Decreased control at his November for physical.  Lisinopril additional 10 mg added for 30 mg total dose. Home readings:130-140's/80-90's.  No new side effects/cough.   BP Readings from Last 3 Encounters:  09/07/19 (!) 132/100  07/05/19 (!) 144/93  01/03/19 140/83   Lab Results  Component Value Date   CREATININE 0.95 07/05/2019   Wt Readings from Last 3 Encounters:  09/07/19 257 lb 6.4 oz (116.8 kg)  07/05/19 253 lb 3.2 oz (114.9 kg)  01/03/19 250 lb 3.2 oz (113.5 kg)    Gout: Discussed at November physical, allopurinol 100 mg daily started given.  elevated uric acid No new side effects.  No flairs of gout.  Lab Results  Component Value Date   CREATININE 0.95 07/05/2019    Lab Results  Component Value Date   LABURIC 7.7 07/05/2019     History Patient Active Problem List   Diagnosis Date Noted  . Other seasonal allergic rhinitis 03/17/2017  . History of kidney stones 04/18/2014  . Essential hypertension 04/18/2014  . LFT elevation 10/08/2012  . Hyperglycemia 10/08/2012   Past Medical History:  Diagnosis Date  . Allergy   . Arthritis    right knee  . GERD (gastroesophageal reflux disease)   . History of kidney stones    multople stones   . Hypertension   . Kidney stone   . Sleep apnea    Past Surgical History:  Procedure Laterality Date  . EXTRACORPOREAL SHOCK WAVE LITHOTRIPSY Right 11/05/2016   Procedure: RIGHT EXTRACORPOREAL SHOCK WAVE LITHOTRIPSY (ESWL);  Surgeon: Malen Gauze, MD;  Location: [facility];  Service: Urology;  Laterality: Right;  . GANGLION CYST EXCISION  1989  . LAPAROSCOPIC GASTRIC  BANDING    . NEVUS EXCISION Right 04/28/2017  . SPINE SURGERY     Allergies  Allergen Reactions  . Pumpkin Seed Hives and Swelling    Swelling of throat, tongue and lips. Swelling of throat, tongue and lips. Swelling of throat, tongue and lips.  . Sesame Seed (Diagnostic) Anaphylaxis  . Bee Venom Swelling  . Insect Extract Allergy Skin Test Hives, Itching and Swelling  . Mobic [Meloxicam] Nausea Only    "just didn't feel well"   Prior to Admission medications   Medication Sig Start Date End Date Taking? Authorizing Provider  allopurinol (ZYLOPRIM) 100 MG tablet Take 1 tablet (100 mg total) by mouth daily. 07/12/19  Yes Shade Flood, MD  aspirin EC 81 MG tablet Take 81 mg by mouth daily.   Yes [provider]  chlorhexidine (PERIDEX) 0.12 % solution RINSE WITH 15 ML BID AFTER MEALS AND THEN SPIT OUT 08/10/18  Yes [provider]  cholecalciferol (VITAMIN D) 1000 UNITS tablet Take 1,000 Units by mouth daily.   Yes [provider]  colchicine 0.6 MG tablet Take two tabs once at the first sign of a gout flare and then one an hour later. Repeat in 24 hours if needed. 08/16/17  Yes Ofilia Neas, PA-C  Cyanocobalamin ER (B-12 DUAL SPECTRUM) 5000 MCG TBCR  09/01/15  Yes [provider]  cyclobenzaprine (FLEXERIL) 5 MG tablet Take 5 mg by mouth 3 (three) times daily as needed for muscle spasms.   Yes [provider]  diclofenac (VOLTAREN) 75 MG EC tablet TAKE 1 TABLET BY MOUTH TWICE A DAY 01/18/19  Yes Wendie Agreste, MD  EPINEPHrine (AUVI-Q) 0.3 mg/0.3 mL IJ SOAJ injection Use as directed for severe allergic reaction. Appointment required for future refills. 09/22/18  Yes Valentina Shaggy, MD  HYDROcodone-acetaminophen (NORCO/VICODIN) 5-325 MG tablet Take 1 tablet by mouth every 6 (six) hours as needed for moderate pain. 01/03/19  Yes Wendie Agreste, MD  lisinopril (ZESTRIL) 10 MG tablet Take 1 tablet (10 mg total) by mouth daily. 07/05/19   Yes Wendie Agreste, MD  lisinopril (ZESTRIL) 20 MG tablet Take 1 tablet (20 mg total) by mouth daily. 07/05/19  Yes Wendie Agreste, MD  Multiple Vitamin (MULTIVITAMIN WITH MINERALS) TABS Take 1 tablet by mouth daily.   Yes [provider]  sodium bicarbonate 325 MG tablet Take 325 mg by mouth 4 (four) times daily.   Yes [provider]  tamsulosin (FLOMAX) 0.4 MG CAPS capsule Take 1 tablet daily as needed for symptoms of kidney stone 4/33/29  Yes Delora Fuel, MD   Social History   Socioeconomic History  . Marital status: Single    Spouse name: Not on file  . Number of children: Not on file  . Years of education: Not on file  . Highest education level: Doctorate  Occupational History  . Not on file  Tobacco Use  . Smoking status: Never Smoker  . Smokeless tobacco: Never Used  Substance and Sexual Activity  . Alcohol use: No  . Drug use: No  . Sexual activity: Yes  Other Topics Concern  . Not on file  Social History Narrative   Lives alone   Caffeine- varies 0-1   Social Determinants of Health   Financial Resource Strain:   . Difficulty of Paying Living Expenses: Not on file  Food Insecurity:   . Worried About Charity fundraiser in the Last Year: Not on file  . Ran Out of Food in the Last Year: Not on file  Transportation Needs:   . Lack of Transportation (Medical): Not on file  . Lack of Transportation (Non-Medical): Not on file  Physical Activity:   . Days of Exercise per Week: Not on file  . Minutes of Exercise per Session: Not on file  Stress:   . Feeling of Stress : Not on file  Social Connections:   . Frequency of Communication with Friends and Family: Not on file  . Frequency of Social Gatherings with Friends and Family: Not on file  . Attends Religious Services: Not on file  . Active Member of Clubs or Organizations: Not on file  . Attends Archivist Meetings: Not on file  . Marital Status: Not on file  Intimate Partner  Violence:   . Fear of Current or Ex-Partner: Not on file  . Emotionally Abused: Not on file  . Physically Abused: Not on file  . Sexually Abused: Not on file    Review of Systems  Constitutional: Negative for fatigue and unexpected weight change.  Eyes: Negative for visual disturbance.  Respiratory: Negative for cough, chest tightness and shortness of breath.   Cardiovascular: Negative for chest pain, palpitations and leg swelling.  Gastrointestinal: Negative for abdominal pain and blood in stool.  Neurological: Negative for dizziness, light-headedness and headaches.     Objective:  Vitals:   09/07/19 1047 09/07/19 1053  BP: (!) 156/102 (!) 132/100  Pulse: 77   Temp: 98.3 F (36.8 C)   TempSrc: Temporal   SpO2: 96%   Weight: 257 lb 6.4 oz (116.8 kg)   Height: 6\' 1"  (1.854 m)      Physical Exam Vitals reviewed.  Constitutional:      Appearance: He is well-developed.  HENT:     Head: Normocephalic and atraumatic.  Eyes:     Pupils: Pupils are equal, round, and reactive to light.  Neck:     Vascular: No carotid bruit or JVD.  Cardiovascular:     Rate and Rhythm: Normal rate and regular rhythm.     Heart sounds: Normal heart sounds. No murmur.  Pulmonary:     Effort: Pulmonary effort is normal.     Breath sounds: Normal breath sounds. No rales.  Skin:    General: Skin is warm and dry.  Neurological:     Mental Status: He is alert and oriented to person, place, and time.        Assessment & Plan:  Juan Nelson is a 54 y.o. male . Essential hypertension  -Improving but still decreased control.  Change lisinopril to 40 mg daily, potential side effects discussed with RTC precautions.  Recheck 3 months.  Continue home monitoring.  History of gout  -Tolerating allopurinol, plan on recheck uric acid next visit.  RTC precautions.  No orders of the defined types were placed in this encounter.  Patient Instructions   Lisinopril increased to a total of  40 mg each day.  Let me know if you have any new side effects on that dose.  Continue allopurinol for gout, recheck in 3 months for repeat testing.  Let me know if there are any questions sooner.   If you have lab work done today you will be contacted with your lab results within the next 2 weeks.  If you have not heard from 53 then please contact us. The fastest way to get your results is to register for My Chart.   IF you received an x-ray today, you will receive an invoice from [facility]. Please contact [facility] at [phone] with questions or concerns regarding your invoice.   IF you received labwork today, you will receive an invoice from [place]. Please contact [facility] at [phone] with questions or concerns regarding your invoice.   Our billing staff will not be able to assist you with questions regarding bills from these companies.  You will be contacted with the lab results as soon as they are available. The fastest way to get your results is to activate your My Chart account. Instructions are located on the last page of this paperwork. If you have not heard from [phone] regarding the results in 2 weeks, please contact this office.         Signed, [place], MD [facility]

## 2019-09-22 DIAGNOSIS — Z20828 Contact with and (suspected) exposure to other viral communicable diseases: Secondary | ICD-10-CM | POA: Diagnosis not present

## 2019-09-26 ENCOUNTER — Ambulatory Visit (INDEPENDENT_AMBULATORY_CARE_PROVIDER_SITE_OTHER): Payer: BC Managed Care – PPO | Admitting: Allergy & Immunology

## 2019-09-26 ENCOUNTER — Encounter: Payer: Self-pay | Admitting: Allergy & Immunology

## 2019-09-26 ENCOUNTER — Other Ambulatory Visit: Payer: Self-pay

## 2019-09-26 VITALS — BP 162/108 | HR 70 | Temp 97.7°F | Resp 16 | Ht 73.0 in | Wt 258.4 lb

## 2019-09-26 DIAGNOSIS — J302 Other seasonal allergic rhinitis: Secondary | ICD-10-CM | POA: Diagnosis not present

## 2019-09-26 DIAGNOSIS — J3089 Other allergic rhinitis: Secondary | ICD-10-CM | POA: Diagnosis not present

## 2019-09-26 DIAGNOSIS — L501 Idiopathic urticaria: Secondary | ICD-10-CM

## 2019-09-26 NOTE — Patient Instructions (Addendum)
1. 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 - We will continue this for now.  - EpiPen is up to date. - Go ahead and get whatever vaccine comes available.  - There are fewer episodes with the Moderna vaccine, but I would get whatever one is available to you.   2. Seasonal allergic rhinitis (trees and cats) - Continue with antihistamines as needed.   3. Return in about 1 year (around 09/25/2020). This can be an in-person, a virtual Webex or a telephone follow up visit.   Please inform us of any Emergency Department visits, hospitalizations, or changes in symptoms. Call us before going to the ED for breathing or allergy symptoms since we might be able to fit you in for a sick visit. Feel free to contact us anytime with any questions, problems, or concerns.  It was a pleasure to see you again today!  Websites that have reliable patient information: 1. American Academy of Asthma, Allergy, and Immunology: www.aaaai.org 2. Food Allergy Research and Education (FARE): foodallergy.org 3. Mothers of Asthmatics: http://www.asthmacommunitynetwork.org 4. American College of Allergy, Asthma, and Immunology: www.acaai.org   COVID-19 Vaccine Information can be found at: [email or web address].[email or web address] For questions related to vaccine distribution or appointments, please email [email or web address] or call [phone].     "Like" [place] on Facebook and Instagram for our latest updates!        Make sure you are registered to vote! If you have moved or changed any of your contact information, you will need to get this updated before voting!  In some cases, you MAY be able to register to vote online: [email or web address]

## 2019-09-26 NOTE — Progress Notes (Signed)
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 This has completely changed his quality of life. He has had minimal/no reactions whatsoever. He is happy to continue with the [place]. We did discuss long term implications of the medication and I explained that there are no red flags with long term use. I did reassure him that there are follow up studies published every year that note no red flags with those who have been on the medication since it came to the marketplace. He is fine to continue with monthly, although I did offer the possibility for him to receive it every 6 weeks or 8 weeks instead. He prefers not to mess with his quality of life at this point. We also had a discussion about the COVID-19 vaccinations. He has had no problems with any vaccines to this point, so I told him to just go ahead and get whatever vaccine he can get his hands on. He is also on Xolair and has an EpiPen, which should decrease his risks of reactions even further.    Plan/Recommendations:   1. 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 - We will continue this for now.  - EpiPen is up to date. - Go ahead and get whatever vaccine comes available.  - There are fewer episodes with the Moderna vaccine, but I would get whatever one is available to you.   2. Seasonal allergic rhinitis (trees and cats) - Continue with antihistamines as needed.   3. Return in about 1 year (around 09/25/2020). This can be an in-person, a virtual Webex or a telephone follow up visit.   Subjective:   Juan Nelson is a 54 y.o. male presenting today for follow up of  Chief Complaint  Patient presents with  . Urticaria  . Other    wants to know which covid vaccine to get  .  sneezing    seems to be sneezing more when eating, bread products    Juan Nelson has a history of the following: Patient Active Problem List   Diagnosis Date Noted  . Other seasonal allergic rhinitis 03/17/2017  . History of kidney stones 04/18/2014  . Essential hypertension 04/18/2014  . LFT elevation 10/08/2012  . Hyperglycemia 10/08/2012    History obtained from: chart review and patient.  Juan Nelson is a 54 y.o. male presenting for a follow up visit.  He was last seen 1 year ago for a follow-up visit.  At that time, he was quite stable on Xolair every month.  He has a history of idiopathic anaphylaxis.  We did do an extensive testing of foods when he first presented that it was all negative  Since last visit, he has done well. Xolair is still working well. He has had no breakouts at all aside from. He takes Benadryl when he needs it. He reports sneezing with wheat. This started around two months ago and has not gone away at all. He will have some crackers or bread, but he does not eat a lot of it, but will some excessive sneezing. He remains on his vegan diet.  He has had no changes to his past medical history.  He was tested for COVID-19 last week when a coworker tested positive. Thankfully  he was negative. He has done some traveling to the mountains, but otherwise has not had any travel in the last 12 months. 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   He does have some questions about the COVID19 vaccination and which one he should take. He has never had any issues with any vaccinations at all.   Otherwise, there have been no changes to his past medical history, surgical history, family history, or social history.    Review of Systems  Constitutional: Negative.  Negative for chills, fever, malaise/fatigue and weight loss.  HENT: Negative.  Negative for congestion, ear discharge, ear pain and sore throat.   Eyes: Negative for pain, discharge and redness.   Respiratory: Negative for cough, sputum production, shortness of breath and wheezing.   Cardiovascular: Negative.  Negative for chest pain and palpitations.  Gastrointestinal: Negative for abdominal pain, constipation, diarrhea, heartburn, nausea and vomiting.  Skin: Negative.  Negative for itching and rash.  Neurological: Negative for dizziness and headaches.  Endo/Heme/Allergies: Negative for environmental allergies. Does not bruise/bleed easily.       Objective:   Blood pressure (!) 162/108, pulse 70, temperature 97.7 F (36.5 C), temperature source Temporal, resp. rate 16, height 6\' 1"  (1.854 m), weight 258 lb 6.4 oz (117.2 kg), SpO2 99 %. Body mass index is 34.09 kg/m.   Physical Exam:  Physical Exam  Constitutional: He appears well-developed.  Pleasant male. Personable as always.   HENT:  Head: Normocephalic and atraumatic.  Right Ear: Tympanic membrane, external ear and ear canal normal.  Left Ear: Tympanic membrane, external ear and ear canal normal.  Nose: Mucosal edema and rhinorrhea present. No nasal deformity or septal deviation. No epistaxis. Right sinus exhibits no maxillary sinus tenderness and no frontal sinus tenderness. Left sinus exhibits no maxillary sinus tenderness and no frontal sinus tenderness.  Mouth/Throat: Uvula is midline and oropharynx is clear and moist. Mucous membranes are not pale and not dry.  Eyes: Pupils are equal, round, and reactive to light. Conjunctivae and EOM are normal. Right eye exhibits no chemosis and no discharge. Left eye exhibits no chemosis and no discharge. Right conjunctiva is not injected. Left conjunctiva is not injected.  Cardiovascular: Normal rate, regular rhythm and normal heart sounds.  Respiratory: Effort normal and breath sounds normal. No accessory muscle usage. No tachypnea. No respiratory distress. He has no wheezes. He has no rhonchi. He has no rales. He exhibits no tenderness.  Moving air well in all lung fields. No  increased work of breathing noted.   Lymphadenopathy:    He has no cervical adenopathy.  Neurological: He is alert.  Skin: No abrasion, no petechiae and no rash noted. Rash is not papular, not vesicular and not urticarial. No erythema. No pallor.  Psychiatric: He has a normal mood and affect.     Diagnostic studies: none     , MD  [facility]

## 2019-10-03 ENCOUNTER — Ambulatory Visit (INDEPENDENT_AMBULATORY_CARE_PROVIDER_SITE_OTHER): Payer: BC Managed Care – PPO

## 2019-10-03 DIAGNOSIS — L501 Idiopathic urticaria: Secondary | ICD-10-CM | POA: Diagnosis not present

## 2019-10-27 DIAGNOSIS — N200 Calculus of kidney: Secondary | ICD-10-CM | POA: Diagnosis not present

## 2019-10-27 DIAGNOSIS — N5201 Erectile dysfunction due to arterial insufficiency: Secondary | ICD-10-CM | POA: Diagnosis not present

## 2019-10-31 ENCOUNTER — Ambulatory Visit (INDEPENDENT_AMBULATORY_CARE_PROVIDER_SITE_OTHER): Payer: BC Managed Care – PPO

## 2019-10-31 ENCOUNTER — Other Ambulatory Visit: Payer: Self-pay

## 2019-10-31 DIAGNOSIS — L501 Idiopathic urticaria: Secondary | ICD-10-CM | POA: Diagnosis not present

## 2019-11-04 ENCOUNTER — Ambulatory Visit: Payer: BC Managed Care – PPO | Attending: Internal Medicine

## 2019-11-04 DIAGNOSIS — Z23 Encounter for immunization: Secondary | ICD-10-CM

## 2019-11-04 NOTE — Progress Notes (Signed)
   Covid-19 Vaccination Clinic  Name:  DRURY ARDIZZONE III    MRN: [id number] DOB: 07/15/1966  11/04/2019  Mr. Mintzer was observed post Covid-19 immunization for 30 minutes based on pre-vaccination screening without incident. He was provided with Vaccine Information Sheet and instruction to access the V-Safe system.   Mr. Baumgartner was instructed to call 911 with any severe reactions post vaccine: Marland Kitchen Difficulty breathing  . Swelling of face and throat  . A fast heartbeat  . A bad rash all over body  . Dizziness and weakness   Immunizations Administered    Name Date Dose VIS Date Route   Pfizer COVID-19 Vaccine 11/04/2019  5:19 PM 0.3 mL 08/11/2019 Intramuscular   Manufacturer: [employer], [employer]   Lot: [id number]   NDC: [address]-9

## 2019-11-25 ENCOUNTER — Ambulatory Visit: Payer: BC Managed Care – PPO

## 2019-11-27 ENCOUNTER — Ambulatory Visit: Payer: BC Managed Care – PPO | Attending: Internal Medicine

## 2019-11-27 DIAGNOSIS — Z23 Encounter for immunization: Secondary | ICD-10-CM

## 2019-11-27 NOTE — Progress Notes (Signed)
   Covid-19 Vaccination Clinic  Name:  Juan Nelson    MRN: [id number] DOB: 04-May-1966  11/27/2019  [name] was observed post Covid-19 immunization for 15 minutes without incident. He was provided with Vaccine Information Sheet and instruction to access the V-Safe system.   [name] was instructed to call 911 with any severe reactions post vaccine: Marland Kitchen Difficulty breathing  . Swelling of face and throat  . A fast heartbeat  . A bad rash all over body  . Dizziness and weakness   Immunizations Administered    Name Date Dose VIS Date Route   Pfizer COVID-19 Vaccine 11/27/2019  8:08 AM 0.3 mL 08/11/2019 Intramuscular   Manufacturer: [employer], [employer]   Lot: [id number]   NDC: [address]-8

## 2019-11-28 ENCOUNTER — Ambulatory Visit: Payer: Self-pay

## 2019-12-05 ENCOUNTER — Encounter: Payer: Self-pay | Admitting: Family Medicine

## 2019-12-05 ENCOUNTER — Ambulatory Visit: Payer: BLUE CROSS/BLUE SHIELD

## 2019-12-06 NOTE — Telephone Encounter (Signed)
Patient just wanted to let you know he had too reschedule his appointment due to a family issue.

## 2019-12-07 ENCOUNTER — Ambulatory Visit: Payer: Self-pay

## 2019-12-08 ENCOUNTER — Ambulatory Visit: Payer: BC Managed Care – PPO | Admitting: Family Medicine

## 2019-12-14 ENCOUNTER — Ambulatory Visit: Payer: Self-pay

## 2019-12-15 ENCOUNTER — Encounter: Payer: Self-pay | Admitting: Family Medicine

## 2019-12-15 ENCOUNTER — Ambulatory Visit (INDEPENDENT_AMBULATORY_CARE_PROVIDER_SITE_OTHER): Payer: BC Managed Care – PPO | Admitting: Family Medicine

## 2019-12-15 ENCOUNTER — Other Ambulatory Visit: Payer: Self-pay

## 2019-12-15 ENCOUNTER — Telehealth: Payer: Self-pay | Admitting: Family Medicine

## 2019-12-15 VITALS — BP 120/76 | HR 70 | Temp 98.1°F | Ht 73.0 in | Wt 253.0 lb

## 2019-12-15 DIAGNOSIS — Z8739 Personal history of other diseases of the musculoskeletal system and connective tissue: Secondary | ICD-10-CM | POA: Diagnosis not present

## 2019-12-15 DIAGNOSIS — I10 Essential (primary) hypertension: Secondary | ICD-10-CM | POA: Diagnosis not present

## 2019-12-15 MED ORDER — LISINOPRIL 40 MG PO TABS: 40.0000 mg | ORAL_TABLET | Freq: Every day | ORAL | 1 refills | Status: DC

## 2019-12-15 MED ORDER — ALLOPURINOL 100 MG PO TABS: 200.0000 mg | ORAL_TABLET | Freq: Every day | ORAL | 1 refills | Status: DC

## 2019-12-15 NOTE — Patient Instructions (Addendum)
   I will check uric acid, but increase allopurinol to 2 pills per day for now to see if flairs lessen.   Blood pressure looks much better at the current lisinopril dose.  Continue same regimen, recheck in 6 months but let me know if there are questions sooner.    If you have lab work done today you will be contacted with your lab results within the next 2 weeks.  If you have not heard from [place] then please contact us. The fastest way to get your results is to register for My Chart.   IF you received an x-ray today, you will receive an invoice from [facility]. Please contact [facility] at [phone] with questions or concerns regarding your invoice.   IF you received labwork today, you will receive an invoice from Center. Please contact [facility] at [phone] with questions or concerns regarding your invoice.   Our billing staff will not be able to assist you with questions regarding bills from these companies.  You will be contacted with the lab results as soon as they are available. The fastest way to get your results is to activate your My Chart account. Instructions are located on the last page of this paperwork. If you have not heard from [place] regarding the results in 2 weeks, please contact this office.

## 2019-12-15 NOTE — Progress Notes (Signed)
Subjective:  Patient ID: Juan Nelson, male    DOB: 1966-07-10  Age: 54 y.o. MRN: [id number]  CC:  Chief Complaint  Patient presents with  . Hypertension    HPI [name] presents for  Hypertension: Lisinopril 40 mg daily.  Dose was increased in January. No new side effects.  Home readings:120-130/70's.  Fasting today.  BP Readings from Last 3 Encounters:  12/15/19 120/76  09/26/19 (!) 162/108  09/07/19 (!) 132/100   Lab Results  Component Value Date   CREATININE 0.95 07/05/2019   Gout: Last flare: once past month. 5 flairs in past 6 months. Daily meds: Allopurinol 100 mg daily Prn med: Diclofenac.  Lab Results  Component Value Date   LABURIC 7.7 07/05/2019    History Patient Active Problem List   Diagnosis Date Noted  . Pain in right knee 04/27/2018  . Other seasonal allergic rhinitis 03/17/2017  . Hx of laparoscopic adjustable gastric banding 02/08/2017  . History of kidney stones 04/18/2014  . Essential hypertension 04/18/2014  . LFT elevation 10/08/2012  . Hyperglycemia 10/08/2012   Past Medical History:  Diagnosis Date  . Allergy   . Arthritis    right knee  . GERD (gastroesophageal reflux disease)   . History of kidney stones    multople stones   . Hypertension   . Kidney stone   . Sleep apnea   . Urticaria    Past Surgical History:  Procedure Laterality Date  . EXTRACORPOREAL SHOCK WAVE LITHOTRIPSY Right 11/05/2016   Procedure: RIGHT EXTRACORPOREAL SHOCK WAVE LITHOTRIPSY (ESWL);  Surgeon: Malen Gauze, MD;  Location: [facility];  Service: Urology;  Laterality: Right;  . GANGLION CYST EXCISION  1989  . LAPAROSCOPIC GASTRIC BANDING    . NEVUS EXCISION Right 04/28/2017  . SPINE SURGERY     Allergies  Allergen Reactions  . Pumpkin Seed Hives and Swelling    Swelling of throat, tongue and lips. Swelling of throat, tongue and lips. Swelling of throat, tongue and lips.  . Sesame Seed (Diagnostic) Anaphylaxis  . Bee Venom  Swelling  . Insect Extract Allergy Skin Test Hives, Itching and Swelling  . Mobic [Meloxicam] Nausea Only    "just didn't feel well"   Prior to Admission medications   Medication Sig Start Date End Date Taking? Authorizing Provider  allopurinol (ZYLOPRIM) 100 MG tablet Take 1 tablet (100 mg total) by mouth daily. 07/12/19  Yes Shade Flood, MD  aspirin EC 81 MG tablet Take 81 mg by mouth daily.   Yes [provider]  chlorhexidine (PERIDEX) 0.12 % solution RINSE WITH 15 ML BID AFTER MEALS AND THEN SPIT OUT 08/10/18  Yes [provider]  cholecalciferol (VITAMIN D) 1000 UNITS tablet Take 1,000 Units by mouth daily.   Yes [provider]  colchicine 0.6 MG tablet Take two tabs once at the first sign of a gout flare and then one an hour later. Repeat in 24 hours if needed. 08/16/17  Yes Ofilia Neas, PA-C  Cyanocobalamin ER (B-12 DUAL SPECTRUM) 5000 MCG TBCR  09/01/15  Yes [provider]  cyclobenzaprine (FLEXERIL) 5 MG tablet Take 5 mg by mouth 3 (three) times daily as needed for muscle spasms.   Yes [provider]  diclofenac (VOLTAREN) 75 MG EC tablet TAKE 1 TABLET BY MOUTH TWICE A DAY 01/18/19  Yes Shade Flood, MD  EPINEPHrine (AUVI-Q) 0.3 mg/0.3 mL IJ SOAJ injection Use as directed for severe allergic reaction. Appointment required  for future refills. 09/22/18  Yes Alfonse Spruce, MD  HYDROcodone-acetaminophen (NORCO/VICODIN) 5-325 MG tablet Take 1 tablet by mouth every 6 (six) hours as needed for moderate pain. 01/03/19  Yes Shade Flood, MD  lisinopril (ZESTRIL) 40 MG tablet Take 1 tablet (40 mg total) by mouth daily. 09/07/19  Yes Shade Flood, MD  Multiple Vitamin (MULTIVITAMIN WITH MINERALS) TABS Take 1 tablet by mouth daily.   Yes [provider]  sodium bicarbonate 325 MG tablet Take 325 mg by mouth 4 (four) times daily.   Yes [provider]  tamsulosin (FLOMAX) 0.4 MG CAPS capsule Take 1 tablet  daily as needed for symptoms of kidney stone 11/15/16  Yes Dione Booze, MD  tadalafil (CIALIS) 20 MG tablet SMARTSIG:1 Tablet(s) By Mouth As Needed 10/27/19   [provider]   Social History   Socioeconomic History  . Marital status: Single    Spouse name: Not on file  . Number of children: Not on file  . Years of education: Not on file  . Highest education level: Doctorate  Occupational History  . Not on file  Tobacco Use  . Smoking status: Never Smoker  . Smokeless tobacco: Never Used  Substance and Sexual Activity  . Alcohol use: No  . Drug use: No  . Sexual activity: Yes  Other Topics Concern  . Not on file  Social History Narrative   Lives alone   Caffeine- varies 0-1   Social Determinants of Health   Financial Resource Strain:   . Difficulty of Paying Living Expenses:   Food Insecurity:   . Worried About Programme researcher, broadcasting/film/video in the Last Year:   . Barista in the Last Year:   Transportation Needs:   . Freight forwarder (Medical):   Marland Kitchen Lack of Transportation (Non-Medical):   Physical Activity:   . Days of Exercise per Week:   . Minutes of Exercise per Session:   Stress:   . Feeling of Stress :   Social Connections:   . Frequency of Communication with Friends and Family:   . Frequency of Social Gatherings with Friends and Family:   . Attends Religious Services:   . Active Member of Clubs or Organizations:   . Attends Banker Meetings:   Marland Kitchen Marital Status:   Intimate Partner Violence:   . Fear of Current or Ex-Partner:   . Emotionally Abused:   Marland Kitchen Physically Abused:   . Sexually Abused:     Review of Systems  Constitutional: Negative for fatigue and unexpected weight change.  Eyes: Negative for visual disturbance.  Respiratory: Negative for cough, chest tightness and shortness of breath.   Cardiovascular: Negative for chest pain, palpitations and leg swelling.  Gastrointestinal: Negative for abdominal pain and blood in stool.   Neurological: Negative for dizziness, light-headedness and headaches.     Objective:   Vitals:   12/15/19 0832  BP: 120/76  Pulse: 70  Temp: 98.1 F (36.7 C)  SpO2: 97%  Weight: 253 lb (114.8 kg)  Height: 6\' 1"  (1.854 m)     Physical Exam Vitals reviewed.  Constitutional:      Appearance: He is well-developed.  HENT:     Head: Normocephalic and atraumatic.  Eyes:     Pupils: Pupils are equal, round, and reactive to light.  Neck:     Vascular: No carotid bruit or JVD.  Cardiovascular:     Rate and Rhythm: Normal rate and regular rhythm.  Heart sounds: Normal heart sounds. No murmur.  Pulmonary:     Effort: Pulmonary effort is normal.     Breath sounds: Normal breath sounds. No rales.  Skin:    General: Skin is warm and dry.  Neurological:     Mental Status: He is alert and oriented to person, place, and time.     Assessment & Plan:  RAINEN VANROSSUM Nelson is a 54 y.o. male . Essential hypertension - Plan: Comprehensive metabolic panel  -  Stable with better readings, tolerating current regimen. Medications refilled. Labs pending as above.   History of gout - Plan: Uric acid  -Check uric acid, increase allopurinol to 200 mg daily given frequent flares.  Recheck 6 months  Meds ordered this encounter  Medications  . allopurinol (ZYLOPRIM) 100 MG tablet    Sig: Take 2 tablets (200 mg total) by mouth daily.    Dispense:  180 tablet    Refill:  1  . lisinopril (ZESTRIL) 40 MG tablet    Sig: Take 1 tablet (40 mg total) by mouth daily.    Dispense:  90 tablet    Refill:  1   Patient Instructions     I will check uric acid, but increase allopurinol to 2 pills per day for now to see if flairs lessen.   Blood pressure looks much better at the current lisinopril dose.  Continue same regimen, recheck in 6 months but let me know if there are questions sooner.    If you have lab work done today you will be contacted with your lab results within the next 2  weeks.  If you have not heard from [place] then please contact us. The fastest way to get your results is to register for My Chart.   IF you received an x-ray today, you will receive an invoice from [facility]. Please contact [facility] at [phone] with questions or concerns regarding your invoice.   IF you received labwork today, you will receive an invoice from [facility]. Please contact [facility] at [phone] with questions or concerns regarding your invoice.   Our billing staff will not be able to assist you with questions regarding bills from these companies.  You will be contacted with the lab results as soon as they are available. The fastest way to get your results is to activate your My Chart account. Instructions are located on the last page of this paperwork. If you have not heard from [place] regarding the results in 2 weeks, please contact this office.         Signed, Merri Ray, MD [facility]

## 2019-12-15 NOTE — Telephone Encounter (Signed)
Called pt and lvmtcb to sch his 6 month follow up for the week of 10/11-10/15

## 2019-12-16 LAB — COMPREHENSIVE METABOLIC PANEL
ALT: 75 IU/L — ABNORMAL HIGH (ref 0–44)
AST: 69 IU/L — ABNORMAL HIGH (ref 0–40)
Albumin/Globulin Ratio: 2 (ref 1.2–2.2)
Albumin: 4.2 g/dL (ref 3.8–4.9)
Alkaline Phosphatase: 110 IU/L (ref 39–117)
BUN/Creatinine Ratio: 10 (ref 9–20)
BUN: 10 mg/dL (ref 6–24)
Bilirubin Total: 0.6 mg/dL (ref 0.0–1.2)
CO2: 20 mmol/L (ref 20–29)
Calcium: 8.7 mg/dL (ref 8.7–10.2)
Chloride: 107 mmol/L — ABNORMAL HIGH (ref 96–106)
Creatinine, Ser: 0.97 mg/dL (ref 0.76–1.27)
GFR calc Af Amer: 101 mL/min/{1.73_m2} (ref >59)
GFR calc non Af Amer: 88 mL/min/{1.73_m2} (ref >59)
Globulin, Total: 2.1 g/dL (ref 1.5–4.5)
Glucose: 117 mg/dL — ABNORMAL HIGH (ref 65–99)
Potassium: 4.3 mmol/L (ref 3.5–5.2)
Sodium: 144 mmol/L (ref 134–144)
Total Protein: 6.3 g/dL (ref 6.0–8.5)

## 2019-12-16 LAB — URIC ACID: Uric Acid: 6.7 mg/dL (ref 3.8–8.4)

## 2019-12-22 ENCOUNTER — Other Ambulatory Visit: Payer: Self-pay

## 2019-12-22 ENCOUNTER — Ambulatory Visit (INDEPENDENT_AMBULATORY_CARE_PROVIDER_SITE_OTHER): Payer: BC Managed Care – PPO

## 2019-12-22 DIAGNOSIS — L501 Idiopathic urticaria: Secondary | ICD-10-CM | POA: Diagnosis not present

## 2020-01-02 DIAGNOSIS — N200 Calculus of kidney: Secondary | ICD-10-CM | POA: Diagnosis not present

## 2020-01-02 DIAGNOSIS — E119 Type 2 diabetes mellitus without complications: Secondary | ICD-10-CM | POA: Diagnosis not present

## 2020-01-02 DIAGNOSIS — I10 Essential (primary) hypertension: Secondary | ICD-10-CM | POA: Diagnosis not present

## 2020-01-02 DIAGNOSIS — M109 Gout, unspecified: Secondary | ICD-10-CM | POA: Diagnosis not present

## 2020-01-08 ENCOUNTER — Encounter: Payer: Self-pay | Admitting: Family Medicine

## 2020-01-08 ENCOUNTER — Ambulatory Visit (INDEPENDENT_AMBULATORY_CARE_PROVIDER_SITE_OTHER): Payer: BC Managed Care – PPO

## 2020-01-08 ENCOUNTER — Ambulatory Visit (INDEPENDENT_AMBULATORY_CARE_PROVIDER_SITE_OTHER): Payer: BC Managed Care – PPO | Admitting: Family Medicine

## 2020-01-08 ENCOUNTER — Other Ambulatory Visit: Payer: Self-pay | Admitting: Family Medicine

## 2020-01-08 ENCOUNTER — Other Ambulatory Visit: Payer: Self-pay

## 2020-01-08 VITALS — BP 120/82 | HR 74 | Temp 98.3°F | Ht 73.0 in | Wt 249.0 lb

## 2020-01-08 DIAGNOSIS — Z8739 Personal history of other diseases of the musculoskeletal system and connective tissue: Secondary | ICD-10-CM

## 2020-01-08 DIAGNOSIS — M79674 Pain in right toe(s): Secondary | ICD-10-CM

## 2020-01-08 NOTE — Progress Notes (Signed)
Subjective:  Patient ID: Juan Nelson, male    DOB: December 27, 1965  Age: 54 y.o. MRN: [id number]  CC:  Chief Complaint  Patient presents with  . right 4th toe pain    stubbed in a doorway around 11-12 days ago. very soar    HPI Juan Nelson presents for   R 4th toe pain: Stubbed R 4th toe 2 weeks ago, on doorframe in middle of night. Possible slight discoloration. No wound, no significant bruising.   History Patient Active Problem List   Diagnosis Date Noted  . Pain in right knee 04/27/2018  . Other seasonal allergic rhinitis 03/17/2017  . Hx of laparoscopic adjustable gastric banding 02/08/2017  . History of kidney stones 04/18/2014  . Essential hypertension 04/18/2014  . LFT elevation 10/08/2012  . Hyperglycemia 10/08/2012   Past Medical History:  Diagnosis Date  . Allergy   . Arthritis    right knee  . GERD (gastroesophageal reflux disease)   . History of kidney stones    multople stones   . Hypertension   . Kidney stone   . Sleep apnea   . Urticaria    Past Surgical History:  Procedure Laterality Date  . EXTRACORPOREAL SHOCK WAVE LITHOTRIPSY Right 11/05/2016   Procedure: RIGHT EXTRACORPOREAL SHOCK WAVE LITHOTRIPSY (ESWL);  Surgeon: Cleon Gustin, MD;  Location: [facility];  Service: Urology;  Laterality: Right;  . GANGLION CYST EXCISION  1989  . LAPAROSCOPIC GASTRIC BANDING    . NEVUS EXCISION Right 04/28/2017  . SPINE SURGERY     Allergies  Allergen Reactions  . Pumpkin Seed Hives and Swelling    Swelling of throat, tongue and lips. Swelling of throat, tongue and lips. Swelling of throat, tongue and lips.  . Sesame Seed (Diagnostic) Anaphylaxis  . Bee Venom Swelling  . Insect Extract Allergy Skin Test Hives, Itching and Swelling  . Mobic [Meloxicam] Nausea Only    "just didn't feel well"   Prior to Admission medications   Medication Sig Start Date End Date Taking? Authorizing Provider  allopurinol (ZYLOPRIM) 100 MG tablet TAKE 1 TABLET  BY MOUTH EVERY DAY 01/08/20  Yes Wendie Agreste, MD  aspirin EC 81 MG tablet Take 81 mg by mouth daily.   Yes [provider]  chlorhexidine (PERIDEX) 0.12 % solution RINSE WITH 15 ML BID AFTER MEALS AND THEN SPIT OUT 08/10/18  Yes [provider]  cholecalciferol (VITAMIN D) 1000 UNITS tablet Take 1,000 Units by mouth daily.   Yes [provider]  colchicine 0.6 MG tablet Take two tabs once at the first sign of a gout flare and then one an hour later. Repeat in 24 hours if needed. 08/16/17  Yes Tereasa Coop, PA-C  Cyanocobalamin ER (B-12 DUAL SPECTRUM) 5000 MCG TBCR  09/01/15  Yes [provider]  cyclobenzaprine (FLEXERIL) 5 MG tablet Take 5 mg by mouth 3 (three) times daily as needed for muscle spasms.   Yes [provider]  diclofenac (VOLTAREN) 75 MG EC tablet TAKE 1 TABLET BY MOUTH TWICE A DAY 01/18/19  Yes Wendie Agreste, MD  EPINEPHrine (AUVI-Q) 0.3 mg/0.3 mL IJ SOAJ injection Use as directed for severe allergic reaction. Appointment required for future refills. 09/22/18  Yes Valentina Shaggy, MD  HYDROcodone-acetaminophen (NORCO/VICODIN) 5-325 MG tablet Take 1 tablet by mouth every 6 (six) hours as needed for moderate pain. 01/03/19  Yes Wendie Agreste, MD  lisinopril (ZESTRIL) 40 MG tablet Take 1 tablet (40 mg  total) by mouth daily. 12/15/19  Yes Shade Flood, MD  Multiple Vitamin (MULTIVITAMIN WITH MINERALS) TABS Take 1 tablet by mouth daily.   Yes [provider]  sodium bicarbonate 325 MG tablet Take 325 mg by mouth 4 (four) times daily.   Yes [provider]  tadalafil (CIALIS) 20 MG tablet SMARTSIG:1 Tablet(s) By Mouth As Needed 10/27/19  Yes [provider]  tamsulosin (FLOMAX) 0.4 MG CAPS capsule Take 1 tablet daily as needed for symptoms of kidney stone 11/15/16  Yes Dione Booze, MD   Social History   Socioeconomic History  . Marital status: Single    Spouse name: Not on file  . Number of  children: Not on file  . Years of education: Not on file  . Highest education level: Doctorate  Occupational History  . Not on file  Tobacco Use  . Smoking status: Never Smoker  . Smokeless tobacco: Never Used  Substance and Sexual Activity  . Alcohol use: No  . Drug use: No  . Sexual activity: Yes  Other Topics Concern  . Not on file  Social History Narrative   Lives alone   Caffeine- varies 0-1   Social Determinants of Health   Financial Resource Strain:   . Difficulty of Paying Living Expenses:   Food Insecurity:   . Worried About Programme researcher, broadcasting/film/video in the Last Year:   . Barista in the Last Year:   Transportation Needs:   . Freight forwarder (Medical):   Marland Kitchen Lack of Transportation (Non-Medical):   Physical Activity:   . Days of Exercise per Week:   . Minutes of Exercise per Session:   Stress:   . Feeling of Stress :   Social Connections:   . Frequency of Communication with Friends and Family:   . Frequency of Social Gatherings with Friends and Family:   . Attends Religious Services:   . Active Member of Clubs or Organizations:   . Attends Banker Meetings:   Marland Kitchen Marital Status:   Intimate Partner Violence:   . Fear of Current or Ex-Partner:   . Emotionally Abused:   Marland Kitchen Physically Abused:   . Sexually Abused:     Review of Systems  Per HPI.  Objective:   Vitals:   01/08/20 1519  BP: 120/82  Pulse: 74  Temp: 98.3 F (36.8 C)  TempSrc: Temporal  SpO2: 96%  Weight: 249 lb (112.9 kg)  Height: 6\' 1"  (1.854 m)     Physical Exam Vitals reviewed.  Constitutional:      General: He is not in acute distress.    Appearance: He is well-developed.  HENT:     Head: Normocephalic and atraumatic.  Cardiovascular:     Rate and Rhythm: Normal rate.  Pulmonary:     Effort: Pulmonary effort is normal.  Musculoskeletal:     Comments: Min sts at base of R 4th toe.  No ecchymosis, skin intact.  Neurovascular intact distally.  Able to flex  and extend toe against resistance.  Minimal tenderness at the fourth MTP, metatarsal nontender.  Neurological:     Mental Status: He is alert and oriented to person, place, and time.    DG Toe 4th Right  Result Date: 01/08/2020 CLINICAL DATA:  Toe pain. EXAM: RIGHT FOURTH TOE COMPARISON:  None. FINDINGS: There is no evidence of fracture or dislocation. There is no evidence of arthropathy or other focal bone abnormality. Soft tissues are unremarkable. IMPRESSION: Negative. Electronically Signed  By: Katherine Mantle M.D.   On: 01/08/2020 15:50     Assessment & Plan:  Juan Nelson is a 54 y.o. male . Pain in right toe(s) - Plan: DG Toe 4th Right  - jammed toe likely, no fracture seen. Buddy tape for 2-4 weeks. rtc precautions.   No orders of the defined types were placed in this encounter.  Patient Instructions     No Buddy tape for next 2-4 weeks at the most. If not improving, or worsening during that time, follow up sooner.     If you have lab work done today you will be contacted with your lab results within the next 2 weeks.  If you have not heard from [place] then please contact us. The fastest way to get your results is to register for My Chart.   IF you received an x-ray today, you will receive an invoice from [facility]. Please contact [facility] at [phone] with questions or concerns regarding your invoice.   IF you received labwork today, you will receive an invoice from [facility]. Please contact [facility] at [phone] with questions or concerns regarding your invoice.   Our billing staff will not be able to assist you with questions regarding bills from these companies.  You will be contacted with the lab results as soon as they are available. The fastest way to get your results is to activate your My Chart account. Instructions are located on the last page of this paperwork. If you have not heard from [place] regarding the results in 2 weeks,  please contact this office.          Signed, Meredith Staggers, MD [facility]

## 2020-01-08 NOTE — Patient Instructions (Addendum)
   No Buddy tape for next 2-4 weeks at the most. If not improving, or worsening during that time, follow up sooner.     If you have lab work done today you will be contacted with your lab results within the next 2 weeks.  If you have not heard from [place] then please contact us. The fastest way to get your results is to register for My Chart.   IF you received an x-ray today, you will receive an invoice from [facility]. Please contact [facility] at [phone] with questions or concerns regarding your invoice.   IF you received labwork today, you will receive an invoice from [facility]. Please contact [facility] at [phone] with questions or concerns regarding your invoice.   Our billing staff will not be able to assist you with questions regarding bills from these companies.  You will be contacted with the lab results as soon as they are available. The fastest way to get your results is to activate your My Chart account. Instructions are located on the last page of this paperwork. If you have not heard from [place] regarding the results in 2 weeks, please contact this office.

## 2020-01-19 ENCOUNTER — Other Ambulatory Visit: Payer: Self-pay

## 2020-01-19 ENCOUNTER — Ambulatory Visit (INDEPENDENT_AMBULATORY_CARE_PROVIDER_SITE_OTHER): Payer: BC Managed Care – PPO | Admitting: *Deleted

## 2020-01-19 DIAGNOSIS — L501 Idiopathic urticaria: Secondary | ICD-10-CM | POA: Diagnosis not present

## 2020-02-09 ENCOUNTER — Other Ambulatory Visit: Payer: Self-pay

## 2020-02-09 ENCOUNTER — Ambulatory Visit (INDEPENDENT_AMBULATORY_CARE_PROVIDER_SITE_OTHER): Payer: BC Managed Care – PPO | Admitting: Family Medicine

## 2020-02-09 ENCOUNTER — Encounter: Payer: Self-pay | Admitting: Family Medicine

## 2020-02-09 VITALS — BP 117/83 | HR 69 | Temp 98.2°F | Resp 15 | Ht 73.0 in | Wt 244.0 lb

## 2020-02-09 DIAGNOSIS — Z8739 Personal history of other diseases of the musculoskeletal system and connective tissue: Secondary | ICD-10-CM | POA: Diagnosis not present

## 2020-02-09 DIAGNOSIS — M79674 Pain in right toe(s): Secondary | ICD-10-CM

## 2020-02-09 DIAGNOSIS — R739 Hyperglycemia, unspecified: Secondary | ICD-10-CM | POA: Diagnosis not present

## 2020-02-09 DIAGNOSIS — R7989 Other specified abnormal findings of blood chemistry: Secondary | ICD-10-CM | POA: Diagnosis not present

## 2020-02-09 LAB — COMPREHENSIVE METABOLIC PANEL
ALT: 83 IU/L — ABNORMAL HIGH (ref 0–44)
AST: 71 IU/L — ABNORMAL HIGH (ref 0–40)
Albumin/Globulin Ratio: 2.5 — ABNORMAL HIGH (ref 1.2–2.2)
Albumin: 4.7 g/dL (ref 3.8–4.9)
Alkaline Phosphatase: 115 IU/L (ref 48–121)
BUN/Creatinine Ratio: 10 (ref 9–20)
BUN: 11 mg/dL (ref 6–24)
Bilirubin Total: 0.8 mg/dL (ref 0.0–1.2)
CO2: 23 mmol/L (ref 20–29)
Calcium: 9.4 mg/dL (ref 8.7–10.2)
Chloride: 105 mmol/L (ref 96–106)
Creatinine, Ser: 1.06 mg/dL (ref 0.76–1.27)
GFR calc Af Amer: 91 mL/min/{1.73_m2} (ref >59)
GFR calc non Af Amer: 79 mL/min/{1.73_m2} (ref >59)
Globulin, Total: 1.9 g/dL (ref 1.5–4.5)
Glucose: 116 mg/dL — ABNORMAL HIGH (ref 65–99)
Potassium: 4.3 mmol/L (ref 3.5–5.2)
Sodium: 141 mmol/L (ref 134–144)
Total Protein: 6.6 g/dL (ref 6.0–8.5)

## 2020-02-09 LAB — HEMOGLOBIN A1C
Est. average glucose Bld gHb Est-mCnc: 117 mg/dL
Hgb A1c MFr Bld: 5.7 % — ABNORMAL HIGH (ref 4.8–5.6)

## 2020-02-09 NOTE — Patient Instructions (Addendum)
  Based on last uric acid, we could increase allopurinol if any frequent flares of gout.  Let me know if one occurs and we can discuss how to do that.  I will check your liver test again today and if still elevated or higher we will check an ultrasound.  If any abdominal pain, nausea or vomiting, return to be seen here or other medical provider.  Blood sugar mildly elevated previously, will check that today.  I am glad to hear the toe pain is improving.  Follow-up if any worsening of symptoms.   If you have lab work done today you will be contacted with your lab results within the next 2 weeks.  If you have not heard from [place] then please contact us. The fastest way to get your results is to register for My Chart.   IF you received an x-ray today, you will receive an invoice from [facility]. Please contact [facility] at [phone] with questions or concerns regarding your invoice.   IF you received labwork today, you will receive an invoice from [facility]. Please contact [facility] at [phone] with questions or concerns regarding your invoice.   Our billing staff will not be able to assist you with questions regarding bills from these companies.  You will be contacted with the lab results as soon as they are available. The fastest way to get your results is to activate your My Chart account. Instructions are located on the last page of this paperwork. If you have not heard from [place] regarding the results in 2 weeks, please contact this office.

## 2020-02-09 NOTE — Progress Notes (Signed)
Subjective:  Patient ID: Juan Nelson, male    DOB: 30-Apr-1966  Age: 54 y.o. MRN: [id number]  CC:  Chief Complaint  Patient presents with  . Foot Pain    pt is here for 6 week recheck of toe, pt has been trying to heal an injured toe notes he has a full feeling in the toe but no visable swelling, pt states he followed instruction and refrained from buddy taping, pt notes rare and very low intensity pain, denies problem standing or walking, no change in range of motion   . abnormal labs    pt also had some abnormal lab work requested that he repeat including uric acid for gout, pt is fasting today     HPI Juan Nelson presents for   Right fourth toe pain: Seen May 10, suspected Jehanzeb no fracture seen on imaging.  Buddy tape for 2 to 4 weeks.  Symptoms have improved - occasional fullness, but no pain. Off buddy taping. No pain with activity, exercise, no limitations.   Hyperglycemia Glucose 117 on labs April 16.  Normal A1c of 5.6 in November 2020 No change in thirst/blurry vision, urinary frequency.   Elevated LFTs: Noted on April 16 labs. AST 36, ALT 56 in November 2020. History of gastric band 2009.  No focal liver abnormalities or gallbladder concern on May 2011 CT abdomen pelvis.  No alcohol, no tylenol. No new supplements.  Weight improved since year.  No n/v/abd pain.   Gout: Daily meds:allopurinol 100mg  qd.  Prn med: diclofenac. Lab Results  Component Value Date   LABURIC 6.7 12/15/2019     Lab Results  Component Value Date   ALT 75 (H) 12/15/2019   AST 69 (H) 12/15/2019   ALKPHOS 110 12/15/2019   BILITOT 0.6 12/15/2019    History Patient Active Problem List   Diagnosis Date Noted  . Pain in right knee 04/27/2018  . Other seasonal allergic rhinitis 03/17/2017  . Hx of laparoscopic adjustable gastric banding 02/08/2017  . History of kidney stones 04/18/2014  . Essential hypertension 04/18/2014  . LFT elevation 10/08/2012  .  Hyperglycemia 10/08/2012   Past Medical History:  Diagnosis Date  . Allergy   . Arthritis    right knee  . GERD (gastroesophageal reflux disease)   . History of kidney stones    multople stones   . Hypertension   . Kidney stone   . Sleep apnea   . Urticaria    Past Surgical History:  Procedure Laterality Date  . EXTRACORPOREAL SHOCK WAVE LITHOTRIPSY Right 11/05/2016   Procedure: RIGHT EXTRACORPOREAL SHOCK WAVE LITHOTRIPSY (ESWL);  Surgeon: 01/05/2017, MD;  Location: [facility];  Service: Urology;  Laterality: Right;  . GANGLION CYST EXCISION  1989  . LAPAROSCOPIC GASTRIC BANDING    . NEVUS EXCISION Right 04/28/2017  . SPINE SURGERY     Allergies  Allergen Reactions  . Pumpkin Seed Hives and Swelling    Swelling of throat, tongue and lips. Swelling of throat, tongue and lips. Swelling of throat, tongue and lips.  . Sesame Seed (Diagnostic) Anaphylaxis  . Bee Venom Swelling  . Insect Extract Allergy Skin Test Hives, Itching and Swelling  . Mobic [Meloxicam] Nausea Only    "just didn't feel well"   Prior to Admission medications   Medication Sig Start Date End Date Taking? Authorizing Provider  allopurinol (ZYLOPRIM) 100 MG tablet TAKE 1 TABLET BY MOUTH EVERY DAY 01/08/20  Yes 03/09/20, MD  aspirin EC 81 MG tablet Take 81 mg by mouth daily.   Yes [provider]  chlorhexidine (PERIDEX) 0.12 % solution RINSE WITH 15 ML BID AFTER MEALS AND THEN SPIT OUT 08/10/18  Yes [provider]  cholecalciferol (VITAMIN D) 1000 UNITS tablet Take 1,000 Units by mouth daily.   Yes [provider]  colchicine 0.6 MG tablet Take two tabs once at the first sign of a gout flare and then one an hour later. Repeat in 24 hours if needed. 08/16/17  Yes Ofilia Neas, PA-C  Cyanocobalamin ER (B-12 DUAL SPECTRUM) 5000 MCG TBCR  09/01/15  Yes [provider]  cyclobenzaprine (FLEXERIL) 5 MG tablet Take 5 mg by mouth 3 (three) times daily as needed for  muscle spasms.   Yes [provider]  diclofenac (VOLTAREN) 75 MG EC tablet TAKE 1 TABLET BY MOUTH TWICE A DAY 01/18/19  Yes Shade Flood, MD  EPINEPHrine (AUVI-Q) 0.3 mg/0.3 mL IJ SOAJ injection Use as directed for severe allergic reaction. Appointment required for future refills. 09/22/18  Yes Alfonse Spruce, MD  HYDROcodone-acetaminophen (NORCO/VICODIN) 5-325 MG tablet Take 1 tablet by mouth every 6 (six) hours as needed for moderate pain. 01/03/19  Yes Shade Flood, MD  lisinopril (ZESTRIL) 40 MG tablet Take 1 tablet (40 mg total) by mouth daily. 12/15/19  Yes Shade Flood, MD  Multiple Vitamin (MULTIVITAMIN WITH MINERALS) TABS Take 1 tablet by mouth daily.   Yes [provider]  sodium bicarbonate 325 MG tablet Take 325 mg by mouth 4 (four) times daily.   Yes [provider]  tadalafil (CIALIS) 20 MG tablet SMARTSIG:1 Tablet(s) By Mouth As Needed 10/27/19  Yes [provider]  tamsulosin (FLOMAX) 0.4 MG CAPS capsule Take 1 tablet daily as needed for symptoms of kidney stone 11/15/16  Yes Dione Booze, MD   Social History   Socioeconomic History  . Marital status: Single    Spouse name: Not on file  . Number of children: Not on file  . Years of education: Not on file  . Highest education level: Doctorate  Occupational History  . Not on file  Tobacco Use  . Smoking status: Never Smoker  . Smokeless tobacco: Never Used  Vaping Use  . Vaping Use: Never used  Substance and Sexual Activity  . Alcohol use: No  . Drug use: No  . Sexual activity: Yes  Other Topics Concern  . Not on file  Social History Narrative   Lives alone   Caffeine- varies 0-1   Social Determinants of Health   Financial Resource Strain:   . Difficulty of Paying Living Expenses:   Food Insecurity:   . Worried About Programme researcher, broadcasting/film/video in the Last Year:   . Barista in the Last Year:   Transportation Needs:   . Freight forwarder (Medical):   Marland Kitchen  Lack of Transportation (Non-Medical):   Physical Activity:   . Days of Exercise per Week:   . Minutes of Exercise per Session:   Stress:   . Feeling of Stress :   Social Connections:   . Frequency of Communication with Friends and Family:   . Frequency of Social Gatherings with Friends and Family:   . Attends Religious Services:   . Active Member of Clubs or Organizations:   . Attends Banker Meetings:   Marland Kitchen Marital Status:   Intimate Partner Violence:   . Fear of Current or Ex-Partner:   .  Emotionally Abused:   Marland Kitchen Physically Abused:   . Sexually Abused:     Review of Systems per HPI.   Objective:   Vitals:   02/09/20 1110  BP: 117/83  Pulse: 69  Resp: 15  Temp: 98.2 F (36.8 C)  TempSrc: Temporal  SpO2: 96%  Weight: 244 lb (110.7 kg)  Height: 6\' 1"  (1.854 m)     Physical Exam Vitals reviewed.  Constitutional:      Appearance: He is well-developed.  HENT:     Head: Normocephalic and atraumatic.  Eyes:     Pupils: Pupils are equal, round, and reactive to light.  Neck:     Vascular: No carotid bruit or JVD.  Cardiovascular:     Rate and Rhythm: Normal rate and regular rhythm.     Heart sounds: Normal heart sounds. No murmur heard.   Pulmonary:     Effort: Pulmonary effort is normal.     Breath sounds: Normal breath sounds. No rales.  Abdominal:     General: Abdomen is flat. There is no distension.     Palpations: Abdomen is soft. There is no mass.     Tenderness: There is no abdominal tenderness. There is no guarding.  Musculoskeletal:     Comments: R 4th toe nontender, pain free motion, no deformity. Slight ttp with squeeze of IP only, nvi distally.   Skin:    General: Skin is warm and dry.  Neurological:     Mental Status: He is alert and oriented to person, place, and time.  Psychiatric:        Mood and Affect: Mood normal.     Assessment & Plan:  Juan Nelson is a 54 y.o. male . Elevated LFTs - Plan: Comprehensive metabolic  panel Slight higher readings recently, Asymptomatic, repeat CMP.  Consider right upper quadrant ultrasound.  Hyperglycemia - Plan: Hemoglobin A1c Asymptomatic, check CMP, A1c.  Previous A1c looked okay.  Pain in right toe(s)  -Suspected fourth toe contusion/jammed toe previously.  Full function, pain-free range of motion at this time.  No further work-up/intervention needed with RTC precautions  History of gout  -Mild elevated uric acid previously.  If frequent flares would consider higher dosing of allopurinol.  RTC precautions.  No orders of the defined types were placed in this encounter.  Patient Instructions    Based on last uric acid, we could increase allopurinol if any frequent flares of gout.  Let me know if one occurs and we can discuss how to do that.  I will check your liver test again today and if still elevated or higher we will check an ultrasound.  If any abdominal pain, nausea or vomiting, return to be seen here or other medical provider.  Blood sugar mildly elevated previously, will check that today.  I am glad to hear the toe pain is improving.  Follow-up if any worsening of symptoms.   If you have lab work done today you will be contacted with your lab results within the next 2 weeks.  If you have not heard from 53 then please contact us. The fastest way to get your results is to register for My Chart.   IF you received an x-ray today, you will receive an invoice from [facility]. Please contact [facility] Radiology at [phone] with questions or concerns regarding your invoice.   IF you received labwork today, you will receive an invoice from [facility]. Please contact [facility] at [phone] with questions or concerns regarding your invoice.  Our billing staff will not be able to assist you with questions regarding bills from these companies.  You will be contacted with the lab results as soon as they are available. The fastest way to get your  results is to activate your My Chart account. Instructions are located on the last page of this paperwork. If you have not heard from [place] regarding the results in 2 weeks, please contact this office.         Signed, Merri Ray, MD [facility]

## 2020-02-16 ENCOUNTER — Other Ambulatory Visit: Payer: Self-pay

## 2020-02-16 ENCOUNTER — Ambulatory Visit (INDEPENDENT_AMBULATORY_CARE_PROVIDER_SITE_OTHER): Payer: BC Managed Care – PPO

## 2020-02-16 DIAGNOSIS — L501 Idiopathic urticaria: Secondary | ICD-10-CM

## 2020-02-20 ENCOUNTER — Encounter: Payer: Self-pay | Admitting: Family Medicine

## 2020-03-15 ENCOUNTER — Ambulatory Visit: Payer: BC Managed Care – PPO

## 2020-03-15 ENCOUNTER — Other Ambulatory Visit: Payer: Self-pay

## 2020-03-15 ENCOUNTER — Ambulatory Visit (INDEPENDENT_AMBULATORY_CARE_PROVIDER_SITE_OTHER): Payer: BC Managed Care – PPO

## 2020-03-15 ENCOUNTER — Encounter: Payer: Self-pay | Admitting: Family Medicine

## 2020-03-15 ENCOUNTER — Ambulatory Visit (INDEPENDENT_AMBULATORY_CARE_PROVIDER_SITE_OTHER): Payer: BC Managed Care – PPO | Admitting: Family Medicine

## 2020-03-15 VITALS — BP 129/90 | HR 74 | Temp 97.2°F | Ht 73.0 in | Wt 238.0 lb

## 2020-03-15 DIAGNOSIS — S300XXA Contusion of lower back and pelvis, initial encounter: Secondary | ICD-10-CM

## 2020-03-15 DIAGNOSIS — M160 Bilateral primary osteoarthritis of hip: Secondary | ICD-10-CM | POA: Diagnosis not present

## 2020-03-15 DIAGNOSIS — S3992XA Unspecified injury of lower back, initial encounter: Secondary | ICD-10-CM | POA: Diagnosis not present

## 2020-03-15 DIAGNOSIS — R7989 Other specified abnormal findings of blood chemistry: Secondary | ICD-10-CM | POA: Diagnosis not present

## 2020-03-15 NOTE — Progress Notes (Signed)
Subjective:  Patient ID: Juan Nelson, male    DOB: Dec 14, 1965  Age: 54 y.o. MRN: [id number]  CC:  Chief Complaint  Patient presents with  . tailbone injury    3 weeks ago- getting better   . elevated LFT's    follow up / tests    HPI Juan Nelson presents for   Tailbone injury: DOI - approx 3 weeks ago.  In a movie, actor, slid down wall, onto concrete on tailbone. Able to ambulate. Sore initially to have BM. No bruising.  Has had some pain that radiates to left groin with cough/sneeze but that is improving. Soreness in tailbone also improved. Normal BM's. No pain into leg - just groin with cough/sneeze, less this week.  Tx: using inflatable donut only, no meds.    Elevated LFTs: Borderline elevated ALT in 2019, 2020.  Slight increase in April of this year along with elevated AST at that time at AST 69/ALT 75.  Repeat testing in June as below.  Liver ultrasound has been recommended but not yet performed.  Weight had increased but now losing some.  No abd pain.   Lab Results  Component Value Date   ALT 83 (H) 02/09/2020   AST 71 (H) 02/09/2020   ALKPHOS 115 02/09/2020   BILITOT 0.8 02/09/2020     History Patient Active Problem List   Diagnosis Date Noted  . Pain in right knee 04/27/2018  . Other seasonal allergic rhinitis 03/17/2017  . Hx of laparoscopic adjustable gastric banding 02/08/2017  . History of kidney stones 04/18/2014  . Essential hypertension 04/18/2014  . LFT elevation 10/08/2012  . Hyperglycemia 10/08/2012   Past Medical History:  Diagnosis Date  . Allergy   . Arthritis    right knee  . GERD (gastroesophageal reflux disease)   . History of kidney stones    multople stones   . Hypertension   . Kidney stone   . Sleep apnea   . Urticaria    Past Surgical History:  Procedure Laterality Date  . EXTRACORPOREAL SHOCK WAVE LITHOTRIPSY Right 11/05/2016   Procedure: RIGHT EXTRACORPOREAL SHOCK WAVE LITHOTRIPSY (ESWL);  Surgeon:  Malen Gauze, MD;  Location: [facility];  Service: Urology;  Laterality: Right;  . GANGLION CYST EXCISION  1989  . LAPAROSCOPIC GASTRIC BANDING    . NEVUS EXCISION Right 04/28/2017  . SPINE SURGERY     Allergies  Allergen Reactions  . Pumpkin Seed Hives and Swelling    Swelling of throat, tongue and lips. Swelling of throat, tongue and lips. Swelling of throat, tongue and lips.  . Sesame Seed (Diagnostic) Anaphylaxis  . Bee Venom Swelling  . Insect Extract Allergy Skin Test Hives, Itching and Swelling  . Mobic [Meloxicam] Nausea Only    "just didn't feel well"   Prior to Admission medications   Medication Sig Start Date End Date Taking? Authorizing Provider  allopurinol (ZYLOPRIM) 100 MG tablet TAKE 1 TABLET BY MOUTH EVERY DAY 01/08/20   Shade Flood, MD  aspirin EC 81 MG tablet Take 81 mg by mouth daily.    [provider]  chlorhexidine (PERIDEX) 0.12 % solution RINSE WITH 15 ML BID AFTER MEALS AND THEN SPIT OUT 08/10/18   [provider]  cholecalciferol (VITAMIN D) 1000 UNITS tablet Take 1,000 Units by mouth daily.    [provider]  colchicine 0.6 MG tablet Take two tabs once at the first sign of a gout flare and then one an hour  later. Repeat in 24 hours if needed. 08/16/17   Ofilia Neaslark, Michael L, PA-C  Cyanocobalamin ER (B-12 DUAL SPECTRUM) 5000 MCG TBCR  09/01/15   [provider]  cyclobenzaprine (FLEXERIL) 5 MG tablet Take 5 mg by mouth 3 (three) times daily as needed for muscle spasms.    [provider]  diclofenac (VOLTAREN) 75 MG EC tablet TAKE 1 TABLET BY MOUTH TWICE A DAY 01/18/19   [name], Rhyatt Muska R, MD  EPINEPHrine (AUVI-Q) 0.3 mg/0.3 mL IJ SOAJ injection Use as directed for severe allergic reaction. Appointment required for future refills. 09/22/18   Alfonse SpruceGallagher, Joel Louis, MD  HYDROcodone-acetaminophen (NORCO/VICODIN) 5-325 MG tablet Take 1 tablet by mouth every 6 (six) hours as needed for moderate pain. 01/03/19   [name],  Tamsyn Owusu R, MD  lisinopril (ZESTRIL) 40 MG tablet Take 1 tablet (40 mg total) by mouth daily. 12/15/19   [name], Hailee Hollick R, MD  Multiple Vitamin (MULTIVITAMIN WITH MINERALS) TABS Take 1 tablet by mouth daily.    [provider]  sodium bicarbonate 325 MG tablet Take 325 mg by mouth 4 (four) times daily.    [provider]  tadalafil (CIALIS) 20 MG tablet SMARTSIG:1 Tablet(s) By Mouth As Needed 10/27/19   [provider]  tamsulosin (FLOMAX) 0.4 MG CAPS capsule Take 1 tablet daily as needed for symptoms of kidney stone 11/15/16   Dione BoozeGlick, David, MD   Social History   Socioeconomic History  . Marital status: Single    Spouse name: Not on file  . Number of children: Not on file  . Years of education: Not on file  . Highest education level: Doctorate  Occupational History  . Not on file  Tobacco Use  . Smoking status: Never Smoker  . Smokeless tobacco: Never Used  Vaping Use  . Vaping Use: Never used  Substance and Sexual Activity  . Alcohol use: No  . Drug use: No  . Sexual activity: Yes  Other Topics Concern  . Not on file  Social History Narrative   Lives alone   Caffeine- varies 0-1   Social Determinants of Health   Financial Resource Strain:   . Difficulty of Paying Living Expenses:   Food Insecurity:   . Worried About Programme researcher, broadcasting/film/videounning Out of Food in the Last Year:   . Baristaan Out of Food in the Last Year:   Transportation Needs:   . Freight forwarderLack of Transportation (Medical):   Marland Kitchen. Lack of Transportation (Non-Medical):   Physical Activity:   . Days of Exercise per Week:   . Minutes of Exercise per Session:   Stress:   . Feeling of Stress :   Social Connections:   . Frequency of Communication with Friends and Family:   . Frequency of Social Gatherings with Friends and Family:   . Attends Religious Services:   . Active Member of Clubs or Organizations:   . Attends BankerClub or Organization Meetings:   Marland Kitchen. Marital Status:   Intimate Partner Violence:   . Fear of Current or  Ex-Partner:   . Emotionally Abused:   Marland Kitchen. Physically Abused:   . Sexually Abused:     Review of Systems   Objective:   Vitals:   03/15/20 0820  BP: 129/90  Pulse: 74  Temp: (!) 97.2 F (36.2 C)  SpO2: 97%  Weight: 238 lb (108 kg)  Height: 6\' 1"  (1.854 m)     Physical Exam Vitals reviewed.  Constitutional:      General: He is not in acute distress.  Appearance: He is well-developed.  HENT:     Head: Normocephalic and atraumatic.  Cardiovascular:     Rate and Rhythm: Normal rate.  Pulmonary:     Effort: Pulmonary effort is normal.  Abdominal:     General: Abdomen is flat. Bowel sounds are normal.     Palpations: There is no mass.     Tenderness: There is no abdominal tenderness.     Comments: Firm structure in the right upper quadrant, reports this is the port for his LAP-BAND.  Nontender.  Musculoskeletal:     Comments: Lumbar spine nontender, ambulating without difficulty.  No CVA tenderness.  Pain-free range of motion of left hip.  No focal bony tenderness on lower lumbar spine.  SI joints nontender.   Neurological:     General: No focal deficit present.     Mental Status: He is alert and oriented to person, place, and time.  Psychiatric:        Mood and Affect: Mood normal.        Behavior: Behavior normal.     Assessment & Plan:  Juan Nelson is a 54 y.o. male . Elevated LFTs - Plan: US Abdomen Limited RUQ, Acute Hep Panel & Hep B Surface Ab, Hepatic Function Panel  -Repeat labs.  Asymptomatic.  Check ultrasound, hepatitis testing as above.  Potentially could have been related in part due to weight gain, but reports that is now improving.  Contusion of coccyx, initial encounter - Plan: DG Sacrum/Coccyx  -Symptoms improving.  Will check x-ray given episodic radiating symptoms to the left groin/hip, but that is improved at this time.  Differential includes HNP.. RTC precautions.  No orders of the defined types were placed in this  encounter.  Patient Instructions       If you have lab work done today you will be contacted with your lab results within the next 2 weeks.  If you have not heard from [place] then please contact us. The fastest way to get your results is to register for My Chart.   IF you received an x-ray today, you will receive an invoice from [facility]. Please contact [facility] at [phone] with questions or concerns regarding your invoice.   IF you received labwork today, you will receive an invoice from [place]. Please contact [facility] at [phone] with questions or concerns regarding your invoice.   Our billing staff will not be able to assist you with questions regarding bills from these companies.  You will be contacted with the lab results as soon as they are available. The fastest way to get your results is to activate your My Chart account. Instructions are located on the last page of this paperwork. If you have not heard from [place] regarding the results in 2 weeks, please contact this office.    I will order an ultrasound of the liver to look for other concerns with the previous elevated liver function test we can also check some other liver infection test today.   Tailbone Injury  The tailbone (coccyx) is the small bone at the lower end of the spine. A tailbone injury may involve stretched ligaments, bruising, or a broken bone (fracture). Tailbone injuries can be painful, and some may take a long time to heal. What are the causes? This condition may be caused by:  Falling and landing on the tailbone.  Repeated strain or friction from sitting for long periods of time. This may include actions such as rowing and bicycling.  Childbirth. In some cases, the cause may not be known. What are the signs or symptoms? Symptoms of this condition include:  Pain in the tailbone area or lower back, especially when sitting.  Pain or difficulty when standing up from a sitting  position.  Bruising or swelling in the tailbone area.  Painful bowel movements.  In women, pain during intercourse. How is this diagnosed? This condition may be diagnosed based on:  Your symptoms.  A physical exam. If your health care provider suspects a fracture, you may have additional tests, such as:  X-rays.  CT scan.  MRI. How is this treated? Most tailbone injuries heal on their own in 4-6 weeks. However, recovery time may be longer if the injury involves a fracture. Treatment for this condition may include:  NSAIDs or other over-the-counter medicines to help relieve your pain.  Using a large, rubber or inflated ring or cushion to take pressure off the tailbone when sitting.  Physical therapy.  Injecting the tailbone area with local anesthesia and steroid medicine. This is not normally needed unless the pain does not improve over time with over-the-counter pain medicines. Follow these instructions at home: Activity  Avoid sitting for long periods of time.  To prevent repeating an injury that is caused by strain or friction: ? Wear appropriate padding and sports gear when bicycling and rowing.  Increase your activity as the pain allows. Perform any exercises that are recommended by your health care provider or physical therapist. Managing pain, stiffness, and swelling  To help decrease discomfort when sitting: ? Sit on your rubber or inflated ring or cushion as told by your health care provider. ? Lean forward when you sit.  If directed, apply ice to the injured area: ? Put ice in a plastic bag. ? Place a towel between your skin and the bag. ? Leave the ice on for 20 minutes, 2-3 times per day for the first 1-2 days.  If directed, apply heat to the affected area as often as told by your health care provider. Use the heat source that your health care provider recommends, such as a moist heat pack or a heating pad. ? Place a towel between your skin and the heat  source. ? Leave the heat on for 20-30 minutes. ? Remove the heat if your skin turns bright red. This is especially important if you are unable to feel pain, heat, or cold. You may have a greater risk of getting burned. General instructions  Take over-the-counter and prescription medicines only as told by your health care provider.  To prevent or treat constipation or painful bowel movements, your health care provider may recommend that you: ? Drink enough fluid to keep your urine pale yellow. ? Eat foods that are high in fiber, such as fresh fruits and vegetables, whole grains, and beans. ? Limit foods that are high in fat and processed sugars, such as fried and sweet foods. ? Take an over-the-counter or prescription medicine for constipation.  Keep all follow-up visits as directed by your health care provider. This is important. Contact a health care provider if:  Your pain becomes worse or is not controlled with medicine.  Your bowel movements cause a great deal of discomfort.  You are unable to have a bowel movement after 4 days.  You have pain during intercourse. Summary  A tailbone injury may involve stretched ligaments, bruising, or a broken bone (fracture).  Tailbone injuries can be painful. Most heal on their own in 4-6  weeks.  Treatment may include taking NSAIDs, using a rubber or inflated ring or cushion when sitting, and physical therapy.  Follow any recommendations from your health care provider to prevent or treat constipation. This information is not intended to replace advice given to you by your health care provider. Make sure you discuss any questions you have with your health care provider. Document Revised: 09/14/2017 Document Reviewed: 09/14/2017 Elsevier Patient Education  2020 [employer].      Signed, Meredith Staggers, MD [facility]

## 2020-03-15 NOTE — Patient Instructions (Addendum)
If you have lab work done today you will be contacted with your lab results within the next 2 weeks.  If you have not heard from [place] then please contact us. The fastest way to get your results is to register for My Chart.   IF you received an x-ray today, you will receive an invoice from [facility]. Please contact [facility] at [phone] with questions or concerns regarding your invoice.   IF you received labwork today, you will receive an invoice from [place]. Please contact [facility] at [phone] with questions or concerns regarding your invoice.   Our billing staff will not be able to assist you with questions regarding bills from these companies.  You will be contacted with the lab results as soon as they are available. The fastest way to get your results is to activate your My Chart account. Instructions are located on the last page of this paperwork. If you have not heard from [place] regarding the results in 2 weeks, please contact this office.    I will order an ultrasound of the liver to look for other concerns with the previous elevated liver function test we can also check some other liver infection test today.   Tailbone Injury  The tailbone (coccyx) is the small bone at the lower end of the spine. A tailbone injury may involve stretched ligaments, bruising, or a broken bone (fracture). Tailbone injuries can be painful, and some may take a long time to heal. What are the causes? This condition may be caused by:  Falling and landing on the tailbone.  Repeated strain or friction from sitting for long periods of time. This may include actions such as rowing and bicycling.  Childbirth. In some cases, the cause may not be known. What are the signs or symptoms? Symptoms of this condition include:  Pain in the tailbone area or lower back, especially when sitting.  Pain or difficulty when standing up from a sitting position.  Bruising or swelling in  the tailbone area.  Painful bowel movements.  In women, pain during intercourse. How is this diagnosed? This condition may be diagnosed based on:  Your symptoms.  A physical exam. If your health care provider suspects a fracture, you may have additional tests, such as:  X-rays.  CT scan.  MRI. How is this treated? Most tailbone injuries heal on their own in 4-6 weeks. However, recovery time may be longer if the injury involves a fracture. Treatment for this condition may include:  NSAIDs or other over-the-counter medicines to help relieve your pain.  Using a large, rubber or inflated ring or cushion to take pressure off the tailbone when sitting.  Physical therapy.  Injecting the tailbone area with local anesthesia and steroid medicine. This is not normally needed unless the pain does not improve over time with over-the-counter pain medicines. Follow these instructions at home: Activity  Avoid sitting for long periods of time.  To prevent repeating an injury that is caused by strain or friction: ? Wear appropriate padding and sports gear when bicycling and rowing.  Increase your activity as the pain allows. Perform any exercises that are recommended by your health care provider or physical therapist. Managing pain, stiffness, and swelling  To help decrease discomfort when sitting: ? Sit on your rubber or inflated ring or cushion as told by your health care provider. ? Lean forward when you sit.  If directed, apply ice to the injured area: ? Put ice in a  plastic bag. ? Place a towel between your skin and the bag. ? Leave the ice on for 20 minutes, 2-3 times per day for the first 1-2 days.  If directed, apply heat to the affected area as often as told by your health care provider. Use the heat source that your health care provider recommends, such as a moist heat pack or a heating pad. ? Place a towel between your skin and the heat source. ? Leave the heat on for  20-30 minutes. ? Remove the heat if your skin turns bright red. This is especially important if you are unable to feel pain, heat, or cold. You may have a greater risk of getting burned. General instructions  Take over-the-counter and prescription medicines only as told by your health care provider.  To prevent or treat constipation or painful bowel movements, your health care provider may recommend that you: ? Drink enough fluid to keep your urine pale yellow. ? Eat foods that are high in fiber, such as fresh fruits and vegetables, whole grains, and beans. ? Limit foods that are high in fat and processed sugars, such as fried and sweet foods. ? Take an over-the-counter or prescription medicine for constipation.  Keep all follow-up visits as directed by your health care provider. This is important. Contact a health care provider if:  Your pain becomes worse or is not controlled with medicine.  Your bowel movements cause a great deal of discomfort.  You are unable to have a bowel movement after 4 days.  You have pain during intercourse. Summary  A tailbone injury may involve stretched ligaments, bruising, or a broken bone (fracture).  Tailbone injuries can be painful. Most heal on their own in 4-6 weeks.  Treatment may include taking NSAIDs, using a rubber or inflated ring or cushion when sitting, and physical therapy.  Follow any recommendations from your health care provider to prevent or treat constipation. This information is not intended to replace advice given to you by your health care provider. Make sure you discuss any questions you have with your health care provider. Document Revised: 09/14/2017 Document Reviewed: 09/14/2017 Elsevier Patient Education  2020 [employer].

## 2020-03-16 LAB — ACUTE HEP PANEL AND HEP B SURFACE AB
Hep A IgM: NEGATIVE
Hep B C IgM: NEGATIVE
Hep C Virus Ab: <0.1 s/co ratio (ref 0.0–0.9)
Hepatitis B Surf Ab Quant: 3.5 m[IU]/mL — ABNORMAL LOW (ref >9.9)
Hepatitis B Surface Ag: NEGATIVE

## 2020-03-16 LAB — HEPATIC FUNCTION PANEL
ALT: 53 IU/L — ABNORMAL HIGH (ref 0–44)
AST: 43 IU/L — ABNORMAL HIGH (ref 0–40)
Albumin: 4.6 g/dL (ref 3.8–4.9)
Alkaline Phosphatase: 123 IU/L — ABNORMAL HIGH (ref 48–121)
Bilirubin Total: 0.6 mg/dL (ref 0.0–1.2)
Bilirubin, Direct: 0.18 mg/dL (ref 0.00–0.40)
Total Protein: 6.7 g/dL (ref 6.0–8.5)

## 2020-03-19 ENCOUNTER — Other Ambulatory Visit: Payer: Self-pay

## 2020-03-19 ENCOUNTER — Other Ambulatory Visit: Payer: Self-pay | Admitting: Family Medicine

## 2020-03-19 ENCOUNTER — Ambulatory Visit (INDEPENDENT_AMBULATORY_CARE_PROVIDER_SITE_OTHER): Payer: BC Managed Care – PPO

## 2020-03-19 DIAGNOSIS — L501 Idiopathic urticaria: Secondary | ICD-10-CM

## 2020-03-19 DIAGNOSIS — M25561 Pain in right knee: Secondary | ICD-10-CM

## 2020-03-19 DIAGNOSIS — G8929 Other chronic pain: Secondary | ICD-10-CM

## 2020-03-19 MED ORDER — DICLOFENAC SODIUM 75 MG PO TBEC: 75.0000 mg | DELAYED_RELEASE_TABLET | Freq: Two times a day (BID) | ORAL | 0 refills | Status: DC

## 2020-03-26 ENCOUNTER — Ambulatory Visit
Admission: RE | Admit: 2020-03-26 | Discharge: 2020-03-26 | Disposition: A | Discharge status: Home / Self Care | Payer: BC Managed Care – PPO | Source: Ambulatory Visit | Attending: Family Medicine | Admitting: Family Medicine

## 2020-03-26 DIAGNOSIS — R7989 Other specified abnormal findings of blood chemistry: Secondary | ICD-10-CM

## 2020-03-26 DIAGNOSIS — K760 Fatty (change of) liver, not elsewhere classified: Secondary | ICD-10-CM | POA: Diagnosis not present

## 2020-04-13 ENCOUNTER — Other Ambulatory Visit: Payer: Self-pay | Admitting: Family Medicine

## 2020-04-13 DIAGNOSIS — M25561 Pain in right knee: Secondary | ICD-10-CM

## 2020-04-13 DIAGNOSIS — G8929 Other chronic pain: Secondary | ICD-10-CM

## 2020-04-13 NOTE — Telephone Encounter (Signed)
Requested  medications are  due for refill today yes  Requested medications are on the active medication list yes  Last refill 03/19/20  Last visit July 2021  Future visit scheduled Oct 2021  Notes to clinic Unclear are to if this is to be continued since just one month given and next appt not until Oct

## 2020-04-16 ENCOUNTER — Other Ambulatory Visit: Payer: Self-pay

## 2020-04-16 ENCOUNTER — Ambulatory Visit (INDEPENDENT_AMBULATORY_CARE_PROVIDER_SITE_OTHER): Payer: BC Managed Care – PPO

## 2020-04-16 DIAGNOSIS — L501 Idiopathic urticaria: Secondary | ICD-10-CM

## 2020-04-22 DIAGNOSIS — N5201 Erectile dysfunction due to arterial insufficiency: Secondary | ICD-10-CM | POA: Diagnosis not present

## 2020-04-22 DIAGNOSIS — N200 Calculus of kidney: Secondary | ICD-10-CM | POA: Diagnosis not present

## 2020-05-12 ENCOUNTER — Other Ambulatory Visit: Payer: Self-pay | Admitting: Family Medicine

## 2020-05-12 DIAGNOSIS — G8929 Other chronic pain: Secondary | ICD-10-CM

## 2020-05-12 DIAGNOSIS — M25561 Pain in right knee: Secondary | ICD-10-CM

## 2020-05-12 NOTE — Telephone Encounter (Signed)
Requested medications are due for refill today?  Yes  Requested medications are on active medication list? Yes  Last Refill:  04/15/2020 # 60 with no refills  Future visit scheduled?  Yes  Notes to Clinic:  Medication failed RX refill protocol due to no labs within the past 360 days.  Last Hgb was on 06/27/2018.

## 2020-05-14 ENCOUNTER — Ambulatory Visit: Payer: BC Managed Care – PPO

## 2020-05-15 ENCOUNTER — Ambulatory Visit (INDEPENDENT_AMBULATORY_CARE_PROVIDER_SITE_OTHER): Payer: BC Managed Care – PPO | Admitting: *Deleted

## 2020-05-15 ENCOUNTER — Other Ambulatory Visit: Payer: Self-pay

## 2020-05-15 DIAGNOSIS — L501 Idiopathic urticaria: Secondary | ICD-10-CM

## 2020-05-18 ENCOUNTER — Emergency Department (HOSPITAL_COMMUNITY)
Admission: EM | Admit: 2020-05-18 | Discharge: 2020-05-18 | Disposition: A | Discharge status: Home / Self Care | Payer: BC Managed Care – PPO | Attending: Emergency Medicine | Admitting: Emergency Medicine

## 2020-05-18 ENCOUNTER — Emergency Department (HOSPITAL_COMMUNITY): Payer: BC Managed Care – PPO

## 2020-05-18 ENCOUNTER — Other Ambulatory Visit: Payer: Self-pay

## 2020-05-18 DIAGNOSIS — K5730 Diverticulosis of large intestine without perforation or abscess without bleeding: Secondary | ICD-10-CM | POA: Diagnosis not present

## 2020-05-18 DIAGNOSIS — Z9884 Bariatric surgery status: Secondary | ICD-10-CM | POA: Diagnosis not present

## 2020-05-18 DIAGNOSIS — Z79899 Other long term (current) drug therapy: Secondary | ICD-10-CM | POA: Insufficient documentation

## 2020-05-18 DIAGNOSIS — N201 Calculus of ureter: Secondary | ICD-10-CM | POA: Insufficient documentation

## 2020-05-18 DIAGNOSIS — I10 Essential (primary) hypertension: Secondary | ICD-10-CM | POA: Insufficient documentation

## 2020-05-18 DIAGNOSIS — Z7982 Long term (current) use of aspirin: Secondary | ICD-10-CM | POA: Insufficient documentation

## 2020-05-18 DIAGNOSIS — R109 Unspecified abdominal pain: Secondary | ICD-10-CM | POA: Diagnosis not present

## 2020-05-18 DIAGNOSIS — N132 Hydronephrosis with renal and ureteral calculous obstruction: Secondary | ICD-10-CM | POA: Diagnosis not present

## 2020-05-18 DIAGNOSIS — N138 Other obstructive and reflux uropathy: Secondary | ICD-10-CM | POA: Diagnosis not present

## 2020-05-18 LAB — COMPREHENSIVE METABOLIC PANEL
ALT: 38 U/L (ref 0–44)
AST: 30 U/L (ref 15–41)
Albumin: 4.3 g/dL (ref 3.5–5.0)
Alkaline Phosphatase: 86 U/L (ref 38–126)
Anion gap: 12 (ref 5–15)
BUN: 15 mg/dL (ref 6–20)
CO2: 25 mmol/L (ref 22–32)
Calcium: 9.2 mg/dL (ref 8.9–10.3)
Chloride: 102 mmol/L (ref 98–111)
Creatinine, Ser: 1.45 mg/dL — ABNORMAL HIGH (ref 0.61–1.24)
GFR calc Af Amer: >60 mL/min (ref >60)
GFR calc non Af Amer: 54 mL/min — ABNORMAL LOW (ref >60)
Glucose, Bld: 100 mg/dL — ABNORMAL HIGH (ref 70–99)
Potassium: 3.8 mmol/L (ref 3.5–5.1)
Sodium: 139 mmol/L (ref 135–145)
Total Bilirubin: 0.9 mg/dL (ref 0.3–1.2)
Total Protein: 7.1 g/dL (ref 6.5–8.1)

## 2020-05-18 LAB — URINALYSIS, ROUTINE W REFLEX MICROSCOPIC
Bilirubin Urine: NEGATIVE
Glucose, UA: NEGATIVE mg/dL
Hgb urine dipstick: NEGATIVE
Ketones, ur: NEGATIVE mg/dL
Leukocytes,Ua: NEGATIVE
Nitrite: NEGATIVE
Protein, ur: NEGATIVE mg/dL
Specific Gravity, Urine: 1.008 (ref 1.005–1.030)
pH: 5 (ref 5.0–8.0)

## 2020-05-18 LAB — CBC WITH DIFFERENTIAL/PLATELET
Abs Immature Granulocytes: 0.02 10*3/uL (ref 0.00–0.07)
Basophils Absolute: 0 10*3/uL (ref 0.0–0.1)
Basophils Relative: 0 %
Eosinophils Absolute: 0.1 10*3/uL (ref 0.0–0.5)
Eosinophils Relative: 1 %
HCT: 38 % — ABNORMAL LOW (ref 39.0–52.0)
Hemoglobin: 13.3 g/dL (ref 13.0–17.0)
Immature Granulocytes: 0 %
Lymphocytes Relative: 16 %
Lymphs Abs: 1.3 10*3/uL (ref 0.7–4.0)
MCH: 31.3 pg (ref 26.0–34.0)
MCHC: 35 g/dL (ref 30.0–36.0)
MCV: 89.4 fL (ref 80.0–100.0)
Monocytes Absolute: 0.8 10*3/uL (ref 0.1–1.0)
Monocytes Relative: 9 %
Neutro Abs: 6.1 10*3/uL (ref 1.7–7.7)
Neutrophils Relative %: 74 %
Platelets: 158 10*3/uL (ref 150–400)
RBC: 4.25 MIL/uL (ref 4.22–5.81)
RDW: 13 % (ref 11.5–15.5)
WBC: 8.3 10*3/uL (ref 4.0–10.5)
nRBC: 0 % (ref 0.0–0.2)

## 2020-05-18 LAB — LIPASE, BLOOD: Lipase: 32 U/L (ref 11–51)

## 2020-05-18 MED ORDER — HYDROMORPHONE HCL 1 MG/ML IJ SOLN
0.5000 mg | Freq: Once | INTRAMUSCULAR | Status: AC
  Administered 2020-05-18: 0.5 mg via INTRAVENOUS
  Filled 2020-05-18: qty 1

## 2020-05-18 MED ORDER — SODIUM CHLORIDE 0.9 % IV BOLUS
1000.0000 mL | Freq: Once | INTRAVENOUS | Status: AC
  Administered 2020-05-18: 1000 mL via INTRAVENOUS

## 2020-05-18 MED ORDER — OXYCODONE-ACETAMINOPHEN 5-325 MG PO TABS: 1.0000 | ORAL_TABLET | Freq: Four times a day (QID) | ORAL | 0 refills | Status: DC | PRN

## 2020-05-18 NOTE — ED Provider Notes (Signed)
[facility]-EMERGENCY DEPT Provider Note   CSN: [id number] Arrival date & time: 05/18/20  1432     History Chief Complaint  Patient presents with  . Flank Pain    Juan Nelson is a 54 y.o. male.  HPI  Patient is a 54 year old male with past medical history of multiple kidney stones presented today with left flank pain he states that he has also had left lower quadrant pain for the past 2 weeks.  He states that his consistently worsening.   He states it is severe, sharp, left-sided, 7/10.  He states it is without any significant aggravating or mitigating factors.  He has no significant associated symptoms.  Denies any significant new symptoms today.  He states he has had frequent kidney stones in the past few years and follows with [facility].  He states he had some form of imaging within the last several weeks that showed a kidney stone.  He states that they have been considering lithotripsy for him but that his symptoms seemed somewhat worse for the past 2 days and he came to the emergency department.  He states he otherwise feels well.  Denies any chest pain or shortness of breath.  No lightheadedness or dizziness.    Past Medical History:  Diagnosis Date  . Allergy   . Arthritis    right knee  . GERD (gastroesophageal reflux disease)   . History of kidney stones    multople stones   . Hypertension   . Kidney stone   . Sleep apnea   . Urticaria     Patient Active Problem List   Diagnosis Date Noted  . Pain in right knee 04/27/2018  . Other seasonal allergic rhinitis 03/17/2017  . Hx of laparoscopic adjustable gastric banding 02/08/2017  . History of kidney stones 04/18/2014  . Essential hypertension 04/18/2014  . LFT elevation 10/08/2012  . Hyperglycemia 10/08/2012    Past Surgical History:  Procedure Laterality Date  . EXTRACORPOREAL SHOCK WAVE LITHOTRIPSY Right 11/05/2016   Procedure: RIGHT EXTRACORPOREAL SHOCK WAVE LITHOTRIPSY  (ESWL);  Surgeon: Malen Gauze, MD;  Location: [facility];  Service: Urology;  Laterality: Right;  . GANGLION CYST EXCISION  1989  . LAPAROSCOPIC GASTRIC BANDING    . NEVUS EXCISION Right 04/28/2017  . SPINE SURGERY         Family History  Problem Relation Age of Onset  . Hypertension Mother   . Diabetes Father   . Cancer Maternal Grandmother   . Depression Maternal Grandfather   . Alzheimer's disease Paternal Grandmother   . Cancer Paternal Grandfather     Social History   Tobacco Use  . Smoking status: Never Smoker  . Smokeless tobacco: Never Used  Vaping Use  . Vaping Use: Never used  Substance Use Topics  . Alcohol use: No  . Drug use: No    Home Medications Prior to Admission medications   Medication Sig Start Date End Date Taking? Authorizing Provider  acetaminophen (TYLENOL) 500 MG tablet Take 500 mg by mouth every 6 (six) hours as needed for headache.   Yes [provider]  allopurinol (ZYLOPRIM) 100 MG tablet TAKE 1 TABLET BY MOUTH EVERY DAY 01/08/20  Yes Shade Flood, MD  aspirin EC 81 MG tablet Take 81 mg by mouth daily.   Yes [provider]  cholecalciferol (VITAMIN D) 1000 UNITS tablet Take 1,000 Units by mouth daily.   Yes [provider]  colchicine 0.6 MG tablet Take  two tabs once at the first sign of a gout flare and then one an hour later. Repeat in 24 hours if needed. Patient taking differently: Take 0.12 mg by mouth daily as needed (gout). Take two tabs once at the first sign of a gout flare and then one an hour later. Repeat in 24 hours if needed. 08/16/17  Yes Ofilia Neaslark, Michael L, PA-C  cyclobenzaprine (FLEXERIL) 5 MG tablet Take 5 mg by mouth 3 (three) times daily as needed for muscle spasms.   Yes [provider]  diclofenac (VOLTAREN) 75 MG EC tablet TAKE 1 TABLET BY MOUTH TWICE A DAY Patient taking differently: Take 75 mg by mouth 2 (two) times daily as needed for mild pain or moderate pain.  05/13/20  Yes  [name], Jeffrey R, MD  EPINEPHrine (AUVI-Q) 0.3 mg/0.3 mL IJ SOAJ injection Use as directed for severe allergic reaction. Appointment required for future refills. 09/22/18  Yes Alfonse SpruceGallagher, Joel Louis, MD  HYDROcodone-acetaminophen (NORCO/VICODIN) 5-325 MG tablet Take 1 tablet by mouth every 6 (six) hours as needed for moderate pain. 01/03/19  Yes [name], Jeffrey R, MD  lisinopril (ZESTRIL) 40 MG tablet Take 1 tablet (40 mg total) by mouth daily. 12/15/19  Yes [name], Jeffrey R, MD  Multiple Vitamin (MULTIVITAMIN WITH MINERALS) TABS Take 1 tablet by mouth daily.   Yes [provider]  sodium bicarbonate 325 MG tablet Take 325 mg by mouth 4 (four) times daily as needed for heartburn (kidney stone).    Yes [provider]  tadalafil (CIALIS) 20 MG tablet Take 20 mg by mouth daily as needed for erectile dysfunction.  10/27/19  Yes [provider]  tamsulosin (FLOMAX) 0.4 MG CAPS capsule Take 1 tablet daily as needed for symptoms of kidney stone Patient taking differently: Take 0.4 mg by mouth daily as needed (kidney stones). Take 1 tablet daily as needed for symptoms of kidney stone 11/15/16  Yes Dione BoozeGlick, David, MD  oxyCODONE-acetaminophen (PERCOCET/ROXICET) 5-325 MG tablet Take 1-2 tablets by mouth every 6 (six) hours as needed for severe pain. 05/18/20   Gailen ShelterFondaw, Ayomide Zuleta S, PA    Allergies    Pumpkin seed, Sesame seed (diagnostic), Bee venom, Insect extract allergy skin test, and Mobic [meloxicam]  Review of Systems   Review of Systems  Constitutional: Negative for chills and fever.  HENT: Negative for congestion.   Respiratory: Negative for shortness of breath.   Cardiovascular: Negative for chest pain.  Gastrointestinal: Left lower quadrant abdominal pain, left leg pain Musculoskeletal: Negative for neck pain.    Physical Exam Updated Vital Signs BP (!) 144/91 (BP Location: Right Arm)   Pulse 82   Temp 98.6 F (37 C) (Oral)   Resp 16   Ht 6\' 1"  (1.854 m)   Wt 109.8 kg    SpO2 97%   BMI 31.93 kg/m   CONSTITUTIONAL:  well-appearing, in acute discomfort, nontoxic appearing NEURO:  Alert and oriented x 3, no focal deficits EYES:  pupils equal and reactive ENT/NECK:  trachea midline, no JVD CARDIO:  reg rate, reg rhythm, well-perfused PULM:  None labored breathing GI/GU:  Abdomen non-distended, no significant abdominal tenderness to palpation.  Positive left-sided CVA tenderness, negative right-sided CVA tenderness, no guarding rebound or masses of the abdomen. MSK/SPINE:  No gross deformities, no edema SKIN:  no rash obvious, atraumatic, no ecchymosis  PSYCH:  Appropriate speech and behavior   ED Results / Procedures / Treatments   Labs (all labs ordered are listed, but only abnormal results are displayed)  Labs Reviewed  URINALYSIS, ROUTINE W REFLEX MICROSCOPIC - Abnormal; Notable for the following components:      Result Value   Color, Urine STRAW (*)    All other components within normal limits  COMPREHENSIVE METABOLIC PANEL - Abnormal; Notable for the following components:   Glucose, Bld 100 (*)    Creatinine, Ser 1.45 (*)    GFR calc non Af Amer 54 (*)    All other components within normal limits  CBC WITH DIFFERENTIAL/PLATELET - Abnormal; Notable for the following components:   HCT 38.0 (*)    All other components within normal limits  LIPASE, BLOOD    EKG None  Radiology CT Renal Stone Study  Result Date: 05/18/2020 CLINICAL DATA:  Left-sided nephrolithiasis, flank pain EXAM: CT ABDOMEN AND PELVIS WITHOUT CONTRAST TECHNIQUE: Multidetector CT imaging of the abdomen and pelvis was performed following the standard protocol without IV contrast. COMPARISON:  01/04/2019 FINDINGS: Lower chest: No acute pleural or parenchymal lung disease. Hepatobiliary: No focal liver abnormality is seen. No gallstones, gallbladder wall thickening, or biliary dilatation. Pancreas: Unremarkable. No pancreatic ductal dilatation or surrounding inflammatory changes.  Spleen: Normal in size without focal abnormality. Adrenals/Urinary Tract: There is moderate left-sided obstructive uropathy due to a 7 mm proximal left ureteral calculus at the left UPJ, reference image 51. There are other punctate less than 3 mm nonobstructing left renal calculi. Numerous punctate less than 3 mm nonobstructing right renal calculi are identified. No right-sided obstruction. The bladder is unremarkable. The adrenals are normal. Stomach/Bowel: No bowel obstruction or ileus. Normal appendix right lower quadrant. Minimal diverticulosis of the distal colon without diverticulitis. Postsurgical changes are seen from bariatric surgery. Vascular/Lymphatic: No significant vascular findings are present. No enlarged abdominal or pelvic lymph nodes. Reproductive: Prostate is unremarkable. Other: No free fluid or free gas.  No abdominal wall hernia. Musculoskeletal: No acute or destructive bony lesions. Reconstructed images demonstrate no additional findings. IMPRESSION: 1. Left-sided obstructive uropathy due to a 7 mm left UPJ calculus. 2. Numerous punctate bilateral nonobstructing renal calculi as well. 3. Diverticulosis without diverticulitis. Electronically Signed   By: Sharlet Salina M.D.   On: 05/18/2020 19:09    Procedures Procedures (including critical care time)  Medications Ordered in ED Medications  sodium chloride 0.9 % bolus 1,000 mL (0 mLs Intravenous Stopped 05/18/20 2001)  HYDROmorphone (DILAUDID) injection 0.5 mg (0.5 mg Intravenous Given 05/18/20 1828)    ED Course  I have reviewed the triage vital signs and the nursing notes.  Pertinent labs & imaging results that were available during my care of the patient were reviewed by me and considered in my medical decision making (see chart for details).  Patient is a 54 year old male with a past medical history detailed above presenting today for 2 weeks of left lower abdominal pain consistent with kidney stones that he has had in the  past.  He denies any significant urinary symptoms apart from some staccato urine which seems to have resolved.  He also states he has intermittently had hematuria but none today.  His states that his pain is severe enough that it caused him come to the ER today for further management.  He is followed by [facility].  Physical exam is notable for no significant tenderness to palpation of the abdomen but pain located in left lower quadrant and he does have CVA tenderness on left side.  Suspect kidney stone doubt AAA, doubt diverticulitis given his lack of bowel symptoms.  Clinical Course as of May 19 2319  Sat May 18, 2020  1801 Discussed with on-call MD for [facility] who states the patient has not had any CT imaging in years.  Did have plain x-ray which did not show any obstructing stone.   [WF]  1928 IMPRESSION: 1. Left-sided obstructive uropathy due to a 7 mm left UPJ calculus. 2. Numerous punctate bilateral nonobstructing renal calculi as well. 3. Diverticulosis without diverticulitis.   [WF]    Clinical Course User Index [WF] Gailen Shelter, [place]   CT renal stone study ordered. See read above. CMP without any significant left lower transplant.  Creatinine very mildly elevated at 1.45 no significant AKI.  Will hold off on ibuprofen or ketorolac.  Lipase within normal limits.  CBC without leukocytosis or anemia.  Urinalysis unremarkable no evidence of infection no hematuria.  Patient reevaluated he states that his pain is very minimal at this time after 0.5 mg of Dilaudid.  Discussed my attending physician Dr. Anitra Lauth.  Will discharge home with Percocet for pain medicine and follow-up with [facility].  Patient provided with a urine strainer he will use this.  Given return precautions for infectious symptoms.  MDM Rules/Calculators/A&P                           Patient discharged with Percocet.  He already has Flomax from his urologist.  He will follow-up on Monday.   Given return precautions.  Final Clinical Impression(s) / ED Diagnoses Final diagnoses:  Ureteral stone    Rx / DC Orders ED Discharge Orders         Ordered    oxyCODONE-acetaminophen (PERCOCET/ROXICET) 5-325 MG tablet  Every 6 hours PRN        05/18/20 1944           Gailen Shelter, [place] 05/18/20 2326    Gwyneth Sprout, MD 05/19/20 1710

## 2020-05-18 NOTE — ED Triage Notes (Signed)
Patient reports he has kidney stone on left side. Pain rated 7/10. Patient states [facility] sent him to ED for evaluation

## 2020-05-18 NOTE — Discharge Instructions (Addendum)
Please call [facility] Monday morning to follow-up.  Please strain your urine with the provided urine strainer and drink plenty of water.  Please take Tylenol and Percocet as prescribed.  As discussed we can hold off on the ibuprofen.

## 2020-05-18 NOTE — ED Notes (Signed)
patient ambulatory to the lobby with a steady gait.

## 2020-06-09 ENCOUNTER — Other Ambulatory Visit: Payer: Self-pay | Admitting: Family Medicine

## 2020-06-09 DIAGNOSIS — G8929 Other chronic pain: Secondary | ICD-10-CM

## 2020-06-09 NOTE — Telephone Encounter (Signed)
Requested Prescriptions  Pending Prescriptions Disp Refills  . diclofenac (VOLTAREN) 75 MG EC tablet [Pharmacy Med Name: DICLOFENAC SOD EC 75 MG TAB] 60 tablet 0    Sig: TAKE 1 TABLET BY MOUTH TWICE A DAY     Analgesics:  NSAIDS Failed - 06/09/2020  5:20 PM      Failed - Cr in normal range and within 360 days    Creat  Date Value Ref Range Status  01/24/2016 1.05 0.70 - 1.33 mg/dL Final   Creatinine, Ser  Date Value Ref Range Status  05/18/2020 1.45 (H) 0.61 - 1.24 mg/dL Final         Passed - HGB in normal range and within 360 days    Hemoglobin  Date Value Ref Range Status  05/18/2020 13.3 13.0 - 17.0 g/dL Final  65/68/1275 17.0 13.0 - 17.7 g/dL Final   HGB  Date Value Ref Range Status  12/23/2006 13.1 13.0 - 17.1 g/dL Final         Passed - Patient is not pregnant      Passed - Valid encounter within last 12 months    Recent Outpatient Visits          2 months ago Elevated LFTs   [facility], Asencion Partridge, MD   4 months ago Elevated LFTs   [facility], Asencion Partridge, MD   5 months ago Pain in right toe(s)   [facility], Asencion Partridge, MD   5 months ago Essential hypertension   [facility], Asencion Partridge, MD   9 months ago Essential hypertension   [facility], Asencion Partridge, MD      Future Appointments            In 3 weeks Neva Seat Asencion Partridge, MD [facility]   In 3 months Alfonse Spruce, MD [facility]

## 2020-06-12 ENCOUNTER — Ambulatory Visit (INDEPENDENT_AMBULATORY_CARE_PROVIDER_SITE_OTHER): Payer: BC Managed Care – PPO | Admitting: *Deleted

## 2020-06-12 DIAGNOSIS — J4540 Moderate persistent asthma, uncomplicated: Secondary | ICD-10-CM

## 2020-06-12 DIAGNOSIS — L304 Erythema intertrigo: Secondary | ICD-10-CM | POA: Diagnosis not present

## 2020-06-12 DIAGNOSIS — D2261 Melanocytic nevi of right upper limb, including shoulder: Secondary | ICD-10-CM | POA: Diagnosis not present

## 2020-06-12 DIAGNOSIS — D225 Melanocytic nevi of trunk: Secondary | ICD-10-CM | POA: Diagnosis not present

## 2020-06-12 DIAGNOSIS — D2262 Melanocytic nevi of left upper limb, including shoulder: Secondary | ICD-10-CM | POA: Diagnosis not present

## 2020-06-14 ENCOUNTER — Ambulatory Visit: Payer: BC Managed Care – PPO | Admitting: Family Medicine

## 2020-06-24 DIAGNOSIS — H903 Sensorineural hearing loss, bilateral: Secondary | ICD-10-CM | POA: Diagnosis not present

## 2020-06-24 DIAGNOSIS — H6123 Impacted cerumen, bilateral: Secondary | ICD-10-CM | POA: Diagnosis not present

## 2020-06-27 ENCOUNTER — Other Ambulatory Visit: Payer: Self-pay | Admitting: Family Medicine

## 2020-06-27 DIAGNOSIS — Z8739 Personal history of other diseases of the musculoskeletal system and connective tissue: Secondary | ICD-10-CM

## 2020-07-05 ENCOUNTER — Encounter: Payer: Self-pay | Admitting: Family Medicine

## 2020-07-05 ENCOUNTER — Other Ambulatory Visit: Payer: Self-pay

## 2020-07-05 ENCOUNTER — Ambulatory Visit (INDEPENDENT_AMBULATORY_CARE_PROVIDER_SITE_OTHER): Payer: BC Managed Care – PPO | Admitting: Family Medicine

## 2020-07-05 VITALS — BP 120/82 | HR 75 | Temp 98.1°F | Ht 73.0 in | Wt 241.4 lb

## 2020-07-05 DIAGNOSIS — Z8739 Personal history of other diseases of the musculoskeletal system and connective tissue: Secondary | ICD-10-CM

## 2020-07-05 DIAGNOSIS — R7989 Other specified abnormal findings of blood chemistry: Secondary | ICD-10-CM | POA: Diagnosis not present

## 2020-07-05 DIAGNOSIS — R7303 Prediabetes: Secondary | ICD-10-CM

## 2020-07-05 DIAGNOSIS — Z87442 Personal history of urinary calculi: Secondary | ICD-10-CM | POA: Diagnosis not present

## 2020-07-05 DIAGNOSIS — I10 Essential (primary) hypertension: Secondary | ICD-10-CM

## 2020-07-05 MED ORDER — LISINOPRIL 40 MG PO TABS: 40.0000 mg | ORAL_TABLET | Freq: Every day | ORAL | 1 refills | Status: DC

## 2020-07-05 NOTE — Progress Notes (Signed)
Subjective:  Patient ID: Juan Nelson, male    DOB: [birth year]  Age: 54 y.o. MRN: [id number]  CC:  Chief Complaint  Patient presents with  . Medical Management of Chronic Issues    Elevated LFTs f/u     HPI Juan Nelson presents for   Elevated LFT's: Thought to be hepatic steatosis.   No alcohol.  Repeat LFTs 2 months ago - nornal (at time of kidney stone).  Ultrasound in July - hepatic steatosis. No focal hepatic lesion. No gallstones or biliary dilatation.  No n/v/abd pain.  Lab Results  Component Value Date   ALT 38 05/18/2020   AST 30 05/18/2020   ALKPHOS 86 05/18/2020   BILITOT 0.9 05/18/2020   Elevated creatinine: Increased form 1.06 to 1.45 at time of kidney stone.  Has not had pain since few days after 9/18 ER visit.    Lab Results  Component Value Date   CREATININE 1.45 (H) 05/18/2020   Gout: Last flare: none recent - at least a few months.  Daily meds:allopurinol 100mg  qd.  Prn med: diclofenac - none recent, colchicine.  Lab Results  Component Value Date   LABURIC 6.7 12/15/2019   Prediabetes: Home readings 110's.  Wt Readings from Last 3 Encounters:  07/05/20 241 lb 6.4 oz (109.5 kg)  05/18/20 242 lb (109.8 kg)  03/15/20 238 lb (108 kg)  less exercise this week - previous walk 3-4 days per week for 1 hr.  Lab Results  Component Value Date   HGBA1C 5.7 (H) 02/09/2020     Hypertension: Lisinopril 40mg  qd.  Home readings:120/70-80. No side effects of meds.  BP Readings from Last 3 Encounters:  07/05/20 120/82  05/18/20 (!) 144/91  03/15/20 129/90   Lab Results  Component Value Date   CREATININE 1.45 (H) 05/18/2020      History Patient Active Problem List   Diagnosis Date Noted  . Pain in right knee 04/27/2018  . Other seasonal allergic rhinitis 03/17/2017  . Hx of laparoscopic adjustable gastric banding 02/08/2017  . History of kidney stones 04/18/2014  . Essential hypertension 04/18/2014  . LFT elevation  10/08/2012  . Hyperglycemia 10/08/2012   Past Medical History:  Diagnosis Date  . Allergy   . Arthritis    right knee  . GERD (gastroesophageal reflux disease)   . History of kidney stones    multople stones   . Hypertension   . Kidney stone   . Sleep apnea   . Urticaria    Past Surgical History:  Procedure Laterality Date  . EXTRACORPOREAL SHOCK WAVE LITHOTRIPSY Right 11/05/2016   Procedure: RIGHT EXTRACORPOREAL SHOCK WAVE LITHOTRIPSY (ESWL);  Surgeon: 12/06/2012, MD;  Location: [facility];  Service: Urology;  Laterality: Right;  . GANGLION CYST EXCISION  1989  . LAPAROSCOPIC GASTRIC BANDING    . NEVUS EXCISION Right 04/28/2017  . SPINE SURGERY     Allergies  Allergen Reactions  . Pumpkin Seed Hives and Swelling    Swelling of throat, tongue and lips. Swelling of throat, tongue and lips. Swelling of throat, tongue and lips.  . Sesame Seed (Diagnostic) Anaphylaxis  . Bee Venom Swelling  . Insect Extract Allergy Skin Test Hives, Itching and Swelling  . Mobic [Meloxicam] Nausea Only    "just didn't feel well"   Prior to Admission medications   Medication Sig Start Date End Date Taking? Authorizing Provider  acetaminophen (TYLENOL) 500 MG tablet Take 500 mg by mouth every 6 (six) hours  as needed for headache.   Yes [provider]  allopurinol (ZYLOPRIM) 100 MG tablet TAKE 1 TABLET BY MOUTH EVERY DAY 06/27/20  Yes [name], Janiya Millirons R, MD  aspirin EC 81 MG tablet Take 81 mg by mouth daily.   Yes [provider]  cholecalciferol (VITAMIN D) 1000 UNITS tablet Take 1,000 Units by mouth daily.   Yes [provider]  colchicine 0.6 MG tablet Take two tabs once at the first sign of a gout flare and then one an hour later. Repeat in 24 hours if needed. Patient taking differently: Take 0.12 mg by mouth daily as needed (gout). Take two tabs once at the first sign of a gout flare and then one an hour later. Repeat in 24 hours if needed. 08/16/17  Yes Ofilia Neaslark,  Michael L, PA-C  cyclobenzaprine (FLEXERIL) 5 MG tablet Take 5 mg by mouth 3 (three) times daily as needed for muscle spasms.   Yes [provider]  diclofenac (VOLTAREN) 75 MG EC tablet TAKE 1 TABLET BY MOUTH TWICE A DAY 06/09/20  Yes [name], Gwendalyn Mcgonagle R, MD  EPINEPHrine (AUVI-Q) 0.3 mg/0.3 mL IJ SOAJ injection Use as directed for severe allergic reaction. Appointment required for future refills. 09/22/18  Yes Alfonse SpruceGallagher, Joel Louis, MD  HYDROcodone-acetaminophen (NORCO/VICODIN) 5-325 MG tablet Take 1 tablet by mouth every 6 (six) hours as needed for moderate pain. 01/03/19  Yes [name], Kerry Odonohue R, MD  lisinopril (ZESTRIL) 40 MG tablet Take 1 tablet (40 mg total) by mouth daily. 12/15/19  Yes [name], Davonn Flanery R, MD  Multiple Vitamin (MULTIVITAMIN WITH MINERALS) TABS Take 1 tablet by mouth daily.   Yes [provider]  oxyCODONE-acetaminophen (PERCOCET/ROXICET) 5-325 MG tablet Take 1-2 tablets by mouth every 6 (six) hours as needed for severe pain. 05/18/20  Yes Fondaw, Wylder S, PA  sodium bicarbonate 325 MG tablet Take 325 mg by mouth 4 (four) times daily as needed for heartburn (kidney stone).    Yes [provider]  tadalafil (CIALIS) 20 MG tablet Take 20 mg by mouth daily as needed for erectile dysfunction.  10/27/19  Yes [provider]  tamsulosin (FLOMAX) 0.4 MG CAPS capsule Take 1 tablet daily as needed for symptoms of kidney stone Patient taking differently: Take 0.4 mg by mouth daily as needed (kidney stones). Take 1 tablet daily as needed for symptoms of kidney stone 11/15/16  Yes Dione BoozeGlick, David, MD   Social History   Socioeconomic History  . Marital status: Single    Spouse name: Not on file  . Number of children: Not on file  . Years of education: Not on file  . Highest education level: Doctorate  Occupational History  . Not on file  Tobacco Use  . Smoking status: Never Smoker  . Smokeless tobacco: Never Used  Vaping Use  . Vaping Use: Never used    Substance and Sexual Activity  . Alcohol use: No  . Drug use: No  . Sexual activity: Yes  Other Topics Concern  . Not on file  Social History Narrative   Lives alone   Caffeine- varies 0-1   Social Determinants of Health   Financial Resource Strain:   . Difficulty of Paying Living Expenses: Not on file  Food Insecurity:   . Worried About Programme researcher, broadcasting/film/videounning Out of Food in the Last Year: Not on file  . Ran Out of Food in the Last Year: Not on file  Transportation Needs:   . Lack of Transportation (Medical): Not on file  . Lack  of Transportation (Non-Medical): Not on file  Physical Activity:   . Days of Exercise per Week: Not on file  . Minutes of Exercise per Session: Not on file  Stress:   . Feeling of Stress : Not on file  Social Connections:   . Frequency of Communication with Friends and Family: Not on file  . Frequency of Social Gatherings with Friends and Family: Not on file  . Attends Religious Services: Not on file  . Active Member of Clubs or Organizations: Not on file  . Attends Banker Meetings: Not on file  . Marital Status: Not on file  Intimate Partner Violence:   . Fear of Current or Ex-Partner: Not on file  . Emotionally Abused: Not on file  . Physically Abused: Not on file  . Sexually Abused: Not on file    Review of Systems  Constitutional: Negative for fatigue and unexpected weight change.  Eyes: Negative for visual disturbance.  Respiratory: Negative for cough, chest tightness and shortness of breath.   Cardiovascular: Negative for chest pain, palpitations and leg swelling.  Gastrointestinal: Negative for abdominal pain and blood in stool.  Genitourinary: Negative for difficulty urinating, dysuria, flank pain, frequency and hematuria.  Neurological: Negative for dizziness, light-headedness and headaches.     Objective:   Vitals:   07/05/20 1440 07/05/20 1508  BP: (!) 146/81 120/82  Pulse: 75   Temp: 98.1 F (36.7 C)   TempSrc: Temporal    SpO2: 98%   Weight: 241 lb 6.4 oz (109.5 kg)   Height: 6\' 1"  (1.854 m)      Physical Exam Vitals reviewed.  Constitutional:      Appearance: He is well-developed.  HENT:     Head: Normocephalic and atraumatic.  Eyes:     Pupils: Pupils are equal, round, and reactive to light.  Neck:     Vascular: No carotid bruit or JVD.  Cardiovascular:     Rate and Rhythm: Normal rate and regular rhythm.     Heart sounds: Normal heart sounds. No murmur heard.   Pulmonary:     Effort: Pulmonary effort is normal.     Breath sounds: Normal breath sounds. No rales.  Skin:    General: Skin is warm and dry.  Neurological:     Mental Status: He is alert and oriented to person, place, and time.        Assessment & Plan:  ALMER BUSHEY Nelson is a 54 y.o. male . Prediabetes - Plan: Hemoglobin A1c  -Borderline A1c previously, repeat A1c, plans to restart exercise.  Elevated serum creatinine - Plan: Comprehensive metabolic panel History of nephrolithiasis - Plan: Comprehensive metabolic panel  -Likely obstructive uropathy previously, anticipate improved creatinine, repeat testing.  Elevated LFTs - Plan: Comprehensive metabolic panel  -Stabilized in ER, repeat testing.  Hepatic steatosis previously noted on ultrasound.  Exercise as above.  History of gout  -Stable, continue same regimen, if recurrent flare then updated uric acid level.  Consider higher dose allopurinol, continue same for now.  Essential hypertension - Plan: lisinopril (ZESTRIL) 40 MG tablet  -Overall stable, check labs, continue lisinopril same dose.  Meds ordered this encounter  Medications  . lisinopril (ZESTRIL) 40 MG tablet    Sig: Take 1 tablet (40 mg total) by mouth daily.    Dispense:  90 tablet    Refill:  1   Patient Instructions     I will review your labs from today and let you know if any changes  needed.  Likely liver tests were improved at ER visit.  Expect kidney function to also be better since ER  visit with kidney stone.  No change in medications today.  Recheck in 6 months.  If you have lab work done today you will be contacted with your lab results within the next 2 weeks.  If you have not heard from [place] then please contact us. The fastest way to get your results is to register for My Chart.   IF you received an x-ray today, you will receive an invoice from [facility]. Please contact [facility] at [phone] with questions or concerns regarding your invoice.   IF you received labwork today, you will receive an invoice from [facility]. Please contact [facility] at [phone] with questions or concerns regarding your invoice.   Our billing staff will not be able to assist you with questions regarding bills from these companies.  You will be contacted with the lab results as soon as they are available. The fastest way to get your results is to activate your My Chart account. Instructions are located on the last page of this paperwork. If you have not heard from [place] regarding the results in 2 weeks, please contact this office.          Signed, Meredith Staggers, MD [facility]

## 2020-07-05 NOTE — Patient Instructions (Addendum)
° °  I will review your labs from today and let you know if any changes needed.  Likely liver tests were improved at ER visit.  Expect kidney function to also be better since ER visit with kidney stone.  No change in medications today.  Recheck in 6 months.  If you have lab work done today you will be contacted with your lab results within the next 2 weeks.  If you have not heard from [place] then please contact us. The fastest way to get your results is to register for My Chart.   IF you received an x-ray today, you will receive an invoice from [facility]. Please contact [facility] at [phone] with questions or concerns regarding your invoice.   IF you received labwork today, you will receive an invoice from [place]. Please contact [facility] at [phone] with questions or concerns regarding your invoice.   Our billing staff will not be able to assist you with questions regarding bills from these companies.  You will be contacted with the lab results as soon as they are available. The fastest way to get your results is to activate your My Chart account. Instructions are located on the last page of this paperwork. If you have not heard from [place] regarding the results in 2 weeks, please contact this office.

## 2020-07-06 LAB — COMPREHENSIVE METABOLIC PANEL
ALT: 32 IU/L (ref 0–44)
AST: 30 IU/L (ref 0–40)
Albumin/Globulin Ratio: 2.3 — ABNORMAL HIGH (ref 1.2–2.2)
Albumin: 4.5 g/dL (ref 3.8–4.9)
Alkaline Phosphatase: 115 IU/L (ref 44–121)
BUN/Creatinine Ratio: 13 (ref 9–20)
BUN: 12 mg/dL (ref 6–24)
Bilirubin Total: 0.4 mg/dL (ref 0.0–1.2)
CO2: 26 mmol/L (ref 20–29)
Calcium: 9.2 mg/dL (ref 8.7–10.2)
Chloride: 103 mmol/L (ref 96–106)
Creatinine, Ser: 0.93 mg/dL (ref 0.76–1.27)
GFR calc Af Amer: 106 mL/min/{1.73_m2} (ref >59)
GFR calc non Af Amer: 92 mL/min/{1.73_m2} (ref >59)
Globulin, Total: 2 g/dL (ref 1.5–4.5)
Glucose: 94 mg/dL (ref 65–99)
Potassium: 4.3 mmol/L (ref 3.5–5.2)
Sodium: 140 mmol/L (ref 134–144)
Total Protein: 6.5 g/dL (ref 6.0–8.5)

## 2020-07-06 LAB — HEMOGLOBIN A1C
Est. average glucose Bld gHb Est-mCnc: 105 mg/dL
Hgb A1c MFr Bld: 5.3 % (ref 4.8–5.6)

## 2020-07-10 ENCOUNTER — Other Ambulatory Visit: Payer: Self-pay | Admitting: Family Medicine

## 2020-07-10 ENCOUNTER — Ambulatory Visit (INDEPENDENT_AMBULATORY_CARE_PROVIDER_SITE_OTHER): Payer: BC Managed Care – PPO | Admitting: *Deleted

## 2020-07-10 DIAGNOSIS — G8929 Other chronic pain: Secondary | ICD-10-CM

## 2020-07-10 DIAGNOSIS — L501 Idiopathic urticaria: Secondary | ICD-10-CM

## 2020-07-10 DIAGNOSIS — M25561 Pain in right knee: Secondary | ICD-10-CM

## 2020-08-06 DIAGNOSIS — K7581 Nonalcoholic steatohepatitis (NASH): Secondary | ICD-10-CM | POA: Diagnosis not present

## 2020-08-06 DIAGNOSIS — K7400 Hepatic fibrosis, unspecified: Secondary | ICD-10-CM | POA: Diagnosis not present

## 2020-08-06 DIAGNOSIS — Z79899 Other long term (current) drug therapy: Secondary | ICD-10-CM | POA: Diagnosis not present

## 2020-08-06 DIAGNOSIS — I2510 Atherosclerotic heart disease of native coronary artery without angina pectoris: Secondary | ICD-10-CM | POA: Diagnosis not present

## 2020-08-07 ENCOUNTER — Other Ambulatory Visit: Payer: Self-pay

## 2020-08-07 ENCOUNTER — Ambulatory Visit (INDEPENDENT_AMBULATORY_CARE_PROVIDER_SITE_OTHER): Payer: BC Managed Care – PPO | Admitting: *Deleted

## 2020-08-07 DIAGNOSIS — L501 Idiopathic urticaria: Secondary | ICD-10-CM

## 2020-09-04 ENCOUNTER — Ambulatory Visit (INDEPENDENT_AMBULATORY_CARE_PROVIDER_SITE_OTHER): Payer: BC Managed Care – PPO

## 2020-09-04 DIAGNOSIS — L501 Idiopathic urticaria: Secondary | ICD-10-CM | POA: Diagnosis not present

## 2020-09-05 ENCOUNTER — Other Ambulatory Visit: Payer: Self-pay | Admitting: Family Medicine

## 2020-09-05 DIAGNOSIS — G8929 Other chronic pain: Secondary | ICD-10-CM

## 2020-09-05 DIAGNOSIS — M25561 Pain in right knee: Secondary | ICD-10-CM

## 2020-09-17 ENCOUNTER — Ambulatory Visit: Payer: BC Managed Care – PPO | Admitting: Allergy & Immunology

## 2020-10-02 ENCOUNTER — Other Ambulatory Visit: Payer: Self-pay

## 2020-10-02 ENCOUNTER — Ambulatory Visit (INDEPENDENT_AMBULATORY_CARE_PROVIDER_SITE_OTHER): Payer: BC Managed Care – PPO

## 2020-10-02 DIAGNOSIS — L501 Idiopathic urticaria: Secondary | ICD-10-CM | POA: Diagnosis not present

## 2020-10-08 ENCOUNTER — Encounter: Payer: Self-pay | Admitting: Allergy & Immunology

## 2020-10-08 ENCOUNTER — Other Ambulatory Visit: Payer: Self-pay

## 2020-10-08 ENCOUNTER — Ambulatory Visit: Payer: BC Managed Care – PPO | Admitting: Allergy & Immunology

## 2020-10-08 VITALS — BP 130/80 | HR 76 | Temp 98.3°F | Resp 18 | Ht 67.0 in | Wt 244.6 lb

## 2020-10-08 DIAGNOSIS — L501 Idiopathic urticaria: Secondary | ICD-10-CM | POA: Diagnosis not present

## 2020-10-08 DIAGNOSIS — J302 Other seasonal allergic rhinitis: Secondary | ICD-10-CM

## 2020-10-08 DIAGNOSIS — J3089 Other allergic rhinitis: Secondary | ICD-10-CM

## 2020-10-08 NOTE — Patient Instructions (Addendum)
1. Anaphylactic shock due to unknown trigger - It seems that you are doing well on Xolair, but we are going to try to space to every 6 weeks.  - EpiPen is up to date.  2. Seasonal allergic rhinitis (trees and cats) - Continue with antihistamines as needed.   3. Return in about 1 year (around 10/08/2021).    Please inform us of any Emergency Department visits, hospitalizations, or changes in symptoms. Call us before going to the ED for breathing or allergy symptoms since we might be able to fit you in for a sick visit. Feel free to contact us anytime with any questions, problems, or concerns.  It was a pleasure to see you again today!  Websites that have reliable patient information: 1. American Academy of Asthma, Allergy, and Immunology: www.aaaai.org 2. Food Allergy Research and Education (FARE): foodallergy.org 3. Mothers of Asthmatics: http://www.asthmacommunitynetwork.org 4. American College of Allergy, Asthma, and Immunology: www.acaai.org   COVID-19 Vaccine Information can be found at: PodExchange.nl For questions related to vaccine distribution or appointments, please email [email or web address] or call [phone].     "Like" [place] on Facebook and Instagram for our latest updates!       Make sure you are registered to vote! If you have moved or changed any of your contact information, you will need to get this updated before voting!  In some cases, you MAY be able to register to vote online: [email or web address]

## 2020-10-08 NOTE — Progress Notes (Signed)
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 Anaphylactic shock due to unknown trigger - It seems that you are doing well on Xolair, but we are going to try to space to every 6 weeks.  - EpiPen is up to date.  2. Seasonal allergic rhinitis (trees and cats) - Continue with antihistamines as needed.   3. Return in about 1 year (around 10/08/2021).   Subjective:   Juan Nelson is a 55 y.o. male presenting today for follow up of  Chief Complaint  Patient presents with  . Urticaria  . Food Intolerance    Bread/ wheat    Juan Nelson has a history of the following: Patient Active Problem List   Diagnosis Date Noted  . Pain in right knee 04/27/2018  . Other seasonal allergic rhinitis 03/17/2017  . Hx of laparoscopic adjustable gastric banding 02/08/2017  . History of kidney stones 04/18/2014  . Essential hypertension 04/18/2014  . LFT elevation 10/08/2012  . Hyperglycemia 10/08/2012    History obtained from: chart review and patient.  Juan Nelson is a 55 y.o. male presenting for a follow up visit.  He has a history of idiopathic anaphylaxis.  He has been maintained on Xolair monthly for probably 2 years at this point.  This is done very well for him.  He was last seen in January 2021.  At that time, he was planning to get the COVID-19 vaccine when he was able.  We also renewed his EpiPen.  Since last visit, he has done well. He has noticed worsening issues with nseezing with vreads/crackers. It is intense sneezing for a moment. He denies throat swelling, hives, or other issues like he is expiernecing with the seeds/grains earlier. It is intense and then over.  Lasted testing to foods in 2018 and he was negative to the entire panel.  Overall, the Xolair seems to be working well.  He has not had an anaphylaxis  episode in years.  He does have an up-to-date EpiPen.  He has not been paying anything for his Xolair at all.  He is open to decreasing the frequency to see how he does without it.  He is vaccinated and boosted.  He is planning some trips this summer where he can drive.  Otherwise, there have been no changes to his past medical history, surgical history, family history, or social history.    Review of Systems  Constitutional: Negative.  Negative for chills, fever, malaise/fatigue and weight loss.  HENT: Negative.  Negative for congestion, ear discharge, ear pain and sore throat.   Eyes: Negative for pain, discharge and redness.  Respiratory: Negative for cough, sputum production, shortness of breath and wheezing.   Cardiovascular: Negative.  Negative for chest pain and palpitations.  Gastrointestinal: Negative for abdominal pain, constipation, diarrhea, heartburn, nausea and vomiting.  Skin: Negative.  Negative for itching and rash.  Neurological: Negative for dizziness and headaches.  Endo/Heme/Allergies: Negative for environmental allergies. Does not bruise/bleed easily.       Objective:   Blood pressure 130/80, pulse 76, temperature 98.3 F (36.8 C), temperature source Temporal, resp. rate 18, height 5\' 7"  (1.702 m), weight 244 lb 9.6 oz (110.9 kg), SpO2 97 %. Body mass index is 38.31 kg/m.   Physical Exam:  Physical Exam Constitutional:      Appearance: He is well-developed.  Comments: Pleasant male.  Cooperative with the exam.  HENT:     Head: Normocephalic and atraumatic.     Right Ear: Tympanic membrane, ear canal and external ear normal.     Left Ear: Tympanic membrane, ear canal and external ear normal.     Nose: No nasal deformity, septal deviation, mucosal edema, rhinorrhea or epistaxis.     Right Turbinates: Enlarged and swollen.     Left Turbinates: Enlarged and swollen.     Right Sinus: No maxillary sinus tenderness or frontal sinus tenderness.     Left  Sinus: No maxillary sinus tenderness or frontal sinus tenderness.     Mouth/Throat:     Mouth: Oropharynx is clear and moist. Mucous membranes are not pale and not dry.     Pharynx: Uvula midline.  Eyes:     General:        Right eye: No discharge.        Left eye: No discharge.     Extraocular Movements: EOM normal.     Conjunctiva/sclera: Conjunctivae normal.     Right eye: Right conjunctiva is not injected. No chemosis.    Left eye: Left conjunctiva is not injected. No chemosis.    Pupils: Pupils are equal, round, and reactive to light.  Cardiovascular:     Rate and Rhythm: Normal rate and regular rhythm.     Heart sounds: Normal heart sounds.  Pulmonary:     Effort: Pulmonary effort is normal. No tachypnea, accessory muscle usage or respiratory distress.     Breath sounds: Normal breath sounds. No wheezing, rhonchi or rales.     Comments: Moving air well in all lung fields. Chest:     Chest wall: No tenderness.  Lymphadenopathy:     Cervical: No cervical adenopathy.  Skin:    Coloration: Skin is not pale.     Findings: No abrasion, erythema, petechiae or rash. Rash is not papular, urticarial or vesicular.  Neurological:     Mental Status: He is alert.  Psychiatric:        Mood and Affect: Mood and affect normal.      Diagnostic studies: none     Malachi Bonds, MD  [facility]

## 2020-10-16 ENCOUNTER — Ambulatory Visit: Payer: BC Managed Care – PPO | Admitting: Podiatry

## 2020-10-16 ENCOUNTER — Other Ambulatory Visit: Payer: Self-pay

## 2020-10-16 ENCOUNTER — Ambulatory Visit (INDEPENDENT_AMBULATORY_CARE_PROVIDER_SITE_OTHER): Payer: BC Managed Care – PPO

## 2020-10-16 DIAGNOSIS — M109 Gout, unspecified: Secondary | ICD-10-CM

## 2020-10-16 DIAGNOSIS — M7751 Other enthesopathy of right foot: Secondary | ICD-10-CM

## 2020-10-30 ENCOUNTER — Ambulatory Visit: Payer: BC Managed Care – PPO

## 2020-10-30 ENCOUNTER — Other Ambulatory Visit: Payer: Self-pay | Admitting: Family Medicine

## 2020-10-30 DIAGNOSIS — G8929 Other chronic pain: Secondary | ICD-10-CM

## 2020-11-02 DIAGNOSIS — M7751 Other enthesopathy of right foot: Secondary | ICD-10-CM | POA: Diagnosis not present

## 2020-11-02 MED ORDER — BETAMETHASONE SOD PHOS & ACET 6 (3-3) MG/ML IJ SUSP
3.0000 mg | Freq: Once | INTRAMUSCULAR | Status: AC
  Administered 2020-11-02: 3 mg via INTRA_ARTICULAR

## 2020-11-02 NOTE — Progress Notes (Signed)
   HPI: 55 y.o. male presenting today as a new patient for evaluation of right foot pain has been going on for several months.  Patient states that May 2021 he was walking and he accidentally jammed his foot into a door.  He has been having pain and tenderness with stiffness ever since.  He has not done anything for treatment.  He presents for further treatment and evaluation  Past Medical History:  Diagnosis Date  . Allergy   . Arthritis    right knee  . GERD (gastroesophageal reflux disease)   . History of kidney stones    multople stones   . Hypertension   . Kidney stone   . Sleep apnea   . Urticaria      Physical Exam: General: The patient is alert and oriented x3 in no acute distress.  Dermatology: Skin is warm, dry and supple bilateral lower extremities. Negative for open lesions or macerations.  Vascular: Palpable pedal pulses bilaterally. No edema or erythema noted. Capillary refill within normal limits.  Neurological: Epicritic and protective threshold grossly intact bilaterally.   Musculoskeletal Exam: Range of motion within normal limits to all pedal and ankle joints bilateral. Muscle strength 5/5 in all groups bilateral.  Pain with palpation and range of motion noted to the fourth MTPJ of the right foot  Radiographic Exam:  Normal osseous mineralization. Joint spaces preserved. No fracture/dislocation/boney destruction.    Assessment: 1.  Fourth MTPJ capsulitis right   Plan of Care:  1. Patient evaluated. X-Rays reviewed.  2.  Injection of 0.5 cc Celestone Soluspan injected in the fourth MTPJ right 3.  Recommend good supportive shoes 4.  Recommend OTC Motrin as needed 5.  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 Felecia Shelling, DPM    [address] [place], [place] [address]                Office [phone]  Fax [phone]

## 2020-11-13 ENCOUNTER — Ambulatory Visit (INDEPENDENT_AMBULATORY_CARE_PROVIDER_SITE_OTHER): Payer: BC Managed Care – PPO

## 2020-11-13 ENCOUNTER — Other Ambulatory Visit: Payer: Self-pay

## 2020-11-13 DIAGNOSIS — L501 Idiopathic urticaria: Secondary | ICD-10-CM

## 2020-11-14 ENCOUNTER — Encounter: Payer: Self-pay | Admitting: Family Medicine

## 2020-11-18 ENCOUNTER — Ambulatory Visit: Payer: BC Managed Care – PPO | Admitting: Family Medicine

## 2020-11-18 ENCOUNTER — Encounter: Payer: Self-pay | Admitting: Family Medicine

## 2020-11-18 ENCOUNTER — Other Ambulatory Visit: Payer: Self-pay

## 2020-11-18 VITALS — BP 128/86 | HR 72 | Temp 98.7°F | Ht 73.0 in | Wt 244.0 lb

## 2020-11-18 DIAGNOSIS — Z9989 Dependence on other enabling machines and devices: Secondary | ICD-10-CM

## 2020-11-18 DIAGNOSIS — G4733 Obstructive sleep apnea (adult) (pediatric): Secondary | ICD-10-CM

## 2020-11-18 DIAGNOSIS — M79661 Pain in right lower leg: Secondary | ICD-10-CM | POA: Diagnosis not present

## 2020-11-18 NOTE — Progress Notes (Signed)
Subjective:  Patient ID: Juan Nelson, male    DOB: 1965-12-18  Age: 55 y.o. MRN: [id number]  CC:  Chief Complaint  Patient presents with  . Leg Pain    Pt reports pain in his R calf muscle. PT reports no know trigger for said pain. PT reports the pain is an aching sensation. Pt reports current pain level is a 6/10.  Juan Nelson Referral    Pt is requesting a referral to a sleep specialist that is a little closer to home. Current specialist is in [place].     HPI Juan Nelson presents for   R calf pain: Started this past weekend. NKI, but was crawling around in attic area of organ instrument chamber. Noticed next day (yesterday and today).  No recent prolonged car travel or air travel.  Sore on backside of calf- tried stretching. Has been seen for that calf years ago d/t atrophy from back issue - had surgery. No recent pain, always smaller than left, but no swelling.  No hx of dvt.  No CP, no dyspnea.  Wells score -2 (possible calf strain)  OSA: Saw provider at [facility] about 15 yrs ago. No recent eval by specialist. On same CPAP. Machine working. Requests local referral for specialist closer.      History Patient Active Problem List   Diagnosis Date Noted  . Pain in right knee 04/27/2018  . Other seasonal allergic rhinitis 03/17/2017  . Hx of laparoscopic adjustable gastric banding 02/08/2017  . History of kidney stones 04/18/2014  . Essential hypertension 04/18/2014  . LFT elevation 10/08/2012  . Hyperglycemia 10/08/2012   Past Medical History:  Diagnosis Date  . Allergy   . Arthritis    right knee  . GERD (gastroesophageal reflux disease)   . History of kidney stones    multople stones   . Hypertension   . Kidney stone   . Sleep apnea   . Urticaria    Past Surgical History:  Procedure Laterality Date  . EXTRACORPOREAL SHOCK WAVE LITHOTRIPSY Right 11/05/2016   Procedure: RIGHT EXTRACORPOREAL SHOCK WAVE LITHOTRIPSY (ESWL);  Surgeon: Malen Gauze, MD;   Location: [facility];  Service: Urology;  Laterality: Right;  . GANGLION CYST EXCISION  1989  . LAPAROSCOPIC GASTRIC BANDING    . NEVUS EXCISION Right 04/28/2017  . SPINE SURGERY     Allergies  Allergen Reactions  . Pumpkin Seed Hives and Swelling    Swelling of throat, tongue and lips. Swelling of throat, tongue and lips. Swelling of throat, tongue and lips.  . Sesame Seed (Diagnostic) Anaphylaxis  . Bee Venom Swelling  . Insect Extract Allergy Skin Test Hives, Itching and Swelling  . Mobic [Meloxicam] Nausea Only    "just didn't feel well"   Prior to Admission medications   Medication Sig Start Date End Date Taking? Authorizing Provider  acetaminophen (TYLENOL) 500 MG tablet Take 500 mg by mouth every 6 (six) hours as needed for headache.   Yes [provider]  allopurinol (ZYLOPRIM) 100 MG tablet TAKE 1 TABLET BY MOUTH EVERY DAY 06/27/20  Yes Shade Flood, MD  aspirin EC 81 MG tablet Take 81 mg by mouth daily.   Yes [provider]  cholecalciferol (VITAMIN D) 1000 UNITS tablet Take 1,000 Units by mouth daily.   Yes [provider]  colchicine 0.6 MG tablet Take two tabs once at the first sign of a gout flare and then one an hour later. Repeat in 24 hours  if needed. Patient taking differently: Take 0.12 mg by mouth daily as needed (gout). Take two tabs once at the first sign of a gout flare and then one an hour later. Repeat in 24 hours if needed. 08/16/17  Yes Ofilia Neas, PA-C  cyclobenzaprine (FLEXERIL) 5 MG tablet Take 5 mg by mouth 3 (three) times daily as needed for muscle spasms.   Yes [provider]  diclofenac (VOLTAREN) 75 MG EC tablet TAKE 1 TABLET BY MOUTH TWICE A DAY 10/30/20  Yes Shade Flood, MD  EPINEPHrine (AUVI-Q) 0.3 mg/0.3 mL IJ SOAJ injection Use as directed for severe allergic reaction. Appointment required for future refills. 09/22/18  Yes Alfonse Spruce, MD  HYDROcodone-acetaminophen (NORCO/VICODIN) 5-325 MG  tablet Take 1 tablet by mouth every 6 (six) hours as needed for moderate pain. 01/03/19  Yes Shade Flood, MD  lisinopril (ZESTRIL) 40 MG tablet Take 1 tablet (40 mg total) by mouth daily. 07/05/20  Yes Shade Flood, MD  Multiple Vitamin (MULTIVITAMIN WITH MINERALS) TABS Take 1 tablet by mouth daily.   Yes [provider]  oxyCODONE-acetaminophen (PERCOCET/ROXICET) 5-325 MG tablet Take 1-2 tablets by mouth every 6 (six) hours as needed for severe pain. 05/18/20  Yes Fondaw, Wylder S, PA  sodium bicarbonate 325 MG tablet Take 325 mg by mouth 4 (four) times daily as needed for heartburn (kidney stone).    Yes [provider]  tadalafil (CIALIS) 20 MG tablet Take 20 mg by mouth daily as needed for erectile dysfunction.  10/27/19  Yes [provider]  tamsulosin (FLOMAX) 0.4 MG CAPS capsule Take 1 tablet daily as needed for symptoms of kidney stone Patient taking differently: Take 0.4 mg by mouth daily as needed (kidney stones). Take 1 tablet daily as needed for symptoms of kidney stone 11/15/16  Yes Dione Booze, MD   Social History   Socioeconomic History  . Marital status: Single    Spouse name: Not on file  . Number of children: Not on file  . Years of education: Not on file  . Highest education level: Doctorate  Occupational History  . Not on file  Tobacco Use  . Smoking status: Never Smoker  . Smokeless tobacco: Never Used  Vaping Use  . Vaping Use: Never used  Substance and Sexual Activity  . Alcohol use: No  . Drug use: No  . Sexual activity: Yes  Other Topics Concern  . Not on file  Social History Narrative   Lives alone   Caffeine- varies 0-1   Social Determinants of Health   Financial Resource Strain: Not on file  Food Insecurity: Not on file  Transportation Needs: Not on file  Physical Activity: Not on file  Stress: Not on file  Social Connections: Not on file  Intimate Partner Violence: Not on file    Review of Systems Per  HPi  Objective:   Vitals:   11/18/20 1143  BP: 128/86  Pulse: 72  Temp: 98.7 F (37.1 C)  TempSrc: Temporal  SpO2: 98%  Weight: 244 lb (110.7 kg)  Height: 6\' 1"  (1.854 m)     Physical Exam Constitutional:      General: He is not in acute distress.    Appearance: He is well-developed.  HENT:     Head: Normocephalic and atraumatic.  Cardiovascular:     Rate and Rhythm: Normal rate.  Pulmonary:     Effort: Pulmonary effort is normal.  Musculoskeletal:     Comments: Right lower leg,  tender to palpation proximal lateral head of the gastroc, no apparent cords, and no apparent varicosities.  Atrophy of the right calf without apparent swelling or pedal edema.  Minimal discomfort with resisted plantarflexion of the foot and with Homans.  Neurological:     Mental Status: He is alert and oriented to person, place, and time.        Assessment & Plan:  NICHOLI GHUMAN Nelson is a 55 y.o. male . OSA on CPAP - Plan: Ambulatory referral to Sleep Studies  -Stable but will refer to sleep specialist as may need updated testing, or follow-up for equipment as needed.  Right calf pain - Plan: D-dimer, quantitative  -Suspect calf strain or overuse with activity day prior.  No known DVT/PE risk factors.  Wells score 0 as above or -2 with possible calf strain.  If symptoms are not improving in the next few days ordered D-dimer then if that is elevated can order ultrasound.  RTC precautions given.  No orders of the defined types were placed in this encounter.  Patient Instructions   Calf pain could be due to activity this weekend.  If the pain is not improving in the next 1 to 2 days  - have the blood work done at the lab as listed below and if that is elevated I can order an ultrasound.  If pain worsens or you notice swelling in the leg or calf prior to that time let us know and I would like to get an ultrasound sooner.   Return to the clinic or go to the nearest emergency room if any of your  symptoms worsen or new symptoms occur.  I referred you for new sleep specialist. You should hear from them in next 2 weeks.    Here are a few lab drawing stations for you to have your labwork performed:  [facility] [address] [place], [place] [address]  [facility] [address] [place], [place] [address]       If you have lab work done today you will be contacted with your lab results within the next 2 weeks.  If you have not heard from [place] then please contact us. The fastest way to get your results is to register for My Chart.   IF you received an x-ray today, you will receive an invoice from [facility]. Please contact [facility] at [phone] with questions or concerns regarding your invoice.   IF you received labwork today, you will receive an invoice from [facility]. Please contact [facility] at [phone] with questions or concerns regarding your invoice.   Our billing staff will not be able to assist you with questions regarding bills from these companies.  You will be contacted with the lab results as soon as they are available. The fastest way to get your results is to activate your My Chart account. Instructions are located on the last page of this paperwork. If you have not heard from [place] regarding the results in 2 weeks, please contact this office.         Signed, Meredith Staggers, MD [facility]

## 2020-11-18 NOTE — Patient Instructions (Addendum)
Calf pain could be due to activity this weekend.  If the pain is not improving in the next 1 to 2 days  - have the blood work done at the lab as listed below and if that is elevated I can order an ultrasound.  If pain worsens or you notice swelling in the leg or calf prior to that time let us know and I would like to get an ultrasound sooner.   Return to the clinic or go to the nearest emergency room if any of your symptoms worsen or new symptoms occur.  I referred you for new sleep specialist. You should hear from them in next 2 weeks.    Here are a few lab drawing stations for you to have your labwork performed:  [facility] [address] [place], [place] [address]  [facility] [address] [place], [place] [address]       If you have lab work done today you will be contacted with your lab results within the next 2 weeks.  If you have not heard from [place] then please contact us. The fastest way to get your results is to register for My Chart.   IF you received an x-ray today, you will receive an invoice from [facility]. Please contact [facility] at [phone] with questions or concerns regarding your invoice.   IF you received labwork today, you will receive an invoice from [facility]. Please contact [facility] at [phone] with questions or concerns regarding your invoice.   Our billing staff will not be able to assist you with questions regarding bills from these companies.  You will be contacted with the lab results as soon as they are available. The fastest way to get your results is to activate your My Chart account. Instructions are located on the last page of this paperwork. If you have not heard from [place] regarding the results in 2 weeks, please contact this office.

## 2020-11-22 DIAGNOSIS — M79661 Pain in right lower leg: Secondary | ICD-10-CM | POA: Diagnosis not present

## 2020-11-23 LAB — D-DIMER, QUANTITATIVE: D-DIMER: 0.23 mg/L FEU (ref 0.00–0.49)

## 2020-11-29 ENCOUNTER — Other Ambulatory Visit: Payer: Self-pay | Admitting: Family Medicine

## 2020-11-29 DIAGNOSIS — I10 Essential (primary) hypertension: Secondary | ICD-10-CM

## 2020-11-29 DIAGNOSIS — Z8739 Personal history of other diseases of the musculoskeletal system and connective tissue: Secondary | ICD-10-CM

## 2020-12-11 ENCOUNTER — Ambulatory Visit: Payer: BC Managed Care – PPO

## 2020-12-25 ENCOUNTER — Other Ambulatory Visit: Payer: Self-pay

## 2020-12-25 ENCOUNTER — Encounter: Payer: Self-pay | Admitting: Neurology

## 2020-12-25 ENCOUNTER — Ambulatory Visit: Payer: BC Managed Care – PPO | Admitting: Neurology

## 2020-12-25 ENCOUNTER — Ambulatory Visit (INDEPENDENT_AMBULATORY_CARE_PROVIDER_SITE_OTHER): Payer: BC Managed Care – PPO

## 2020-12-25 VITALS — BP 145/84 | HR 84 | Ht 73.0 in | Wt 244.0 lb

## 2020-12-25 DIAGNOSIS — K219 Gastro-esophageal reflux disease without esophagitis: Secondary | ICD-10-CM

## 2020-12-25 DIAGNOSIS — R0681 Apnea, not elsewhere classified: Secondary | ICD-10-CM

## 2020-12-25 DIAGNOSIS — L501 Idiopathic urticaria: Secondary | ICD-10-CM | POA: Diagnosis not present

## 2020-12-25 DIAGNOSIS — I10 Essential (primary) hypertension: Secondary | ICD-10-CM

## 2020-12-25 DIAGNOSIS — G4763 Sleep related bruxism: Secondary | ICD-10-CM | POA: Diagnosis not present

## 2020-12-25 DIAGNOSIS — G4733 Obstructive sleep apnea (adult) (pediatric): Secondary | ICD-10-CM | POA: Diagnosis not present

## 2020-12-25 DIAGNOSIS — Z9989 Dependence on other enabling machines and devices: Secondary | ICD-10-CM

## 2020-12-25 NOTE — Progress Notes (Signed)
SLEEP MEDICINE CLINIC    Provider:  Melvyn Novas, MD  Primary Care Physician:  Shade Flood, MD [address] [place] [address]     Referring Provider: Shade Flood, Md [address] [place],  [place] [address]          Chief Complaint according to patient   Patient presents with:    . New Patient (Initial Visit)     Pt alone, rm 10. Pt presents today to establish care with a sleep specialist and local DME company. Last SS 2004. He has a travel CPAP and [employer]. States no issues or concerns with the machine.       HISTORY OF PRESENT ILLNESS:  Juan Nelson is a 55- year -old Caucasian male patient seen here as a referral on 12/25/2020 from Dr Neva Seat for a transfer of sleep apnea care.   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 He had back surgery in 2014, was seen by Luciano Cutter, MD    The patient had the first sleep study in the year 2004-  with a result of an AHI ( Apnea Hypopnea index)  of 28/h, the patient had some periodic limb movements but this did not lead to arousals.  He had a normal cardiac rhythm according to Dr. Jetty Duhamel at the time he was referred by Dr. Dinah Beers.  This based on PSG was followed by a CPAP titration to 14 cmH2O dated 12-05-2002 the patient had snoring that was positional dependent.  He did not have central apneas only in rem sleep where there a few apneas remaining while on CPAP.  The AHI was 0.0 at 14 cm water pressure.   Sleep relevant medical history: one nocturia on CPAP- no  snoring on CPAP.     Family medical /sleep history: his father with OSA.    Social history:  Patient is working as Research officer, political party an Runner, broadcasting/film/video - choir- church related.  and lives in a household allone. 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  Tobacco use: none .  ETOH use none ,Caffeine intake in form of Coffee(/) Soda( daily) Tea ( daily*) or energy drinks. Regular exercise in form of walking.    Sleep habits are as follows: The patient's dinner time is between 6-8.30 PM. The patient goes to bed at 11-12 PM and continues to sleep for interval of 4 hours, wakes for one bathroom break.   The preferred sleep position is right and supine , with the support of 2 pillows with one additional body pillow- Dreams are reportedly rare. 8.30 AM is the usual rise time. The patient wakes up spontaneously as late as 8AM.  He reports not feeling refreshed or restored in AM, quality has ben declining-  Naps are taken infrequently, lasting from 20-30  minutes and are more refreshing than nocturnal sleep.    Review of Systems: Out of a complete 14 system review, the patient complains of only the following symptoms,  and all other reviewed systems are negative.:  Fatigue, sleepiness , snoring is not present while on CPAP.    How likely are you to doze in the following situations: 0 = not likely, 1 = slight chance, 2 = moderate chance, 3 = high chance   Sitting and Reading? Watching Television? Sitting inactive in a public place (theater or meeting)? As a passenger in a car for an hour without a break? Lying down in the afternoon when circumstances permit? Sitting and talking to someone? Sitting quietly after lunch without alcohol? In a car, while stopped for a few minutes in traffic?   Total = 5/ 24 points   FSS endorsed at 15  Stress with pandemic. 17/ 63 points.   Social History   Socioeconomic History  . Marital status: Single     Spouse name: Not on file  . Number of children: Not on file  . Years of education: Not on file  . Highest education level: Doctorate  Occupational History  . Not on file  Tobacco Use  . Smoking status: Never Smoker  . Smokeless tobacco: Never Used  Vaping Use  . Vaping Use: Never used  Substance and Sexual Activity  . Alcohol use: No  . Drug use: No  . Sexual activity: Yes  Other Topics Concern  . Not on file  Social History Narrative   Lives alone   Caffeine- varies 0-1   Social Determinants of Health   Financial Resource Strain: Not on file  Food Insecurity: Not on file  Transportation Needs: Not on file  Physical Activity: Not on file  Stress: Not on file  Social Connections: Not on file    Family History  Problem Relation Age of Onset  . Hypertension Mother   . Diabetes Father   . Cancer Maternal Grandmother   . Depression Maternal Grandfather   . Alzheimer's disease Paternal Grandmother   . Cancer Paternal Grandfather     Past Medical History:  Diagnosis Date  . Allergy   . Arthritis    right knee  . GERD (gastroesophageal reflux disease)   . History of kidney stones    multople stones   . Hypertension   . Kidney stone   . Sleep apnea   . Urticaria     Past Surgical History:  Procedure Laterality Date  . EXTRACORPOREAL SHOCK WAVE LITHOTRIPSY Right 11/05/2016   Procedure: RIGHT EXTRACORPOREAL SHOCK WAVE LITHOTRIPSY (ESWL);  Surgeon: Malen Gauze, MD;  Location: [facility];  Service: Urology;  Laterality: Right;  . GANGLION CYST EXCISION  1989  . LAPAROSCOPIC GASTRIC BANDING    . NEVUS EXCISION Right 04/28/2017  . SPINE SURGERY       Current Outpatient Medications on File Prior to Visit  Medication Sig Dispense Refill  . acetaminophen (TYLENOL) 500 MG tablet Take 500 mg by mouth every 6 (six) hours as needed for headache.    . allopurinol (ZYLOPRIM) 100 MG tablet TAKE 1 TABLET BY MOUTH EVERY DAY 30 tablet 5  . aspirin EC 81 MG tablet Take 81 mg  by mouth daily.    . cholecalciferol (VITAMIN D) 1000 UNITS tablet Take 1,000 Units by mouth daily.    . colchicine 0.6 MG tablet Take two tabs once at the first sign of a gout flare and then one an hour later. Repeat in 24 hours if needed. (Patient taking differently: Take 0.12 mg by mouth daily as needed (gout). Take two tabs once at the first sign of a gout  flare and then one an hour later. Repeat in 24 hours if needed.) 12 tablet 1  . cyclobenzaprine (FLEXERIL) 5 MG tablet Take 5 mg by mouth 3 (three) times daily as needed for muscle spasms.    . diclofenac (VOLTAREN) 75 MG EC tablet TAKE 1 TABLET BY MOUTH TWICE A DAY 180 tablet 0  . EPINEPHrine (AUVI-Q) 0.3 mg/0.3 mL IJ SOAJ injection Use as directed for severe allergic reaction. Appointment required for future refills. 2 Device 0  . HYDROcodone-acetaminophen (NORCO/VICODIN) 5-325 MG tablet Take 1 tablet by mouth every 6 (six) hours as needed for moderate pain. 15 tablet 0  . lisinopril (ZESTRIL) 40 MG tablet TAKE 1 TABLET BY MOUTH EVERY DAY 30 tablet 5  . Multiple Vitamin (MULTIVITAMIN WITH MINERALS) TABS Take 1 tablet by mouth daily.    Marland Kitchen    0  . sodium bicarbonate 325 MG tablet Take 325 mg by mouth 4 (four) times daily as needed for heartburn (kidney stone).     . tadalafil (CIALIS) 20 MG tablet Take 20 mg by mouth daily as needed for erectile dysfunction.     . tamsulosin (FLOMAX) 0.4 MG CAPS capsule Take 1 tablet daily as needed for symptoms of kidney stone (Patient taking differently: Take 0.4 mg by mouth daily as needed (kidney stones). Take 1 tablet daily as needed for symptoms of kidney stone) 15 capsule 1   Current Facility-Administered Medications on File Prior to Visit  Medication Dose Route Frequency Provider Last Rate Last Admin  . omalizumab Geoffry Paradise) injection 300 mg  300 mg Subcutaneous Q28 days Marcelyn Bruins, MD   300 mg at 12/25/20 1046    Allergies  Allergen Reactions  . Pumpkin Seed Hives and Swelling     Swelling of throat, tongue and lips. Swelling of throat, tongue and lips. Swelling of throat, tongue and lips.  . Sesame Seed (Diagnostic) Anaphylaxis  . Bee Venom Swelling  . Insect Extract Allergy Skin Test Hives, Itching and Swelling  . Mobic [Meloxicam] Nausea Only    "just didn't feel well"    Physical exam:  Today's Vitals   12/25/20 1302  BP: (!) 145/84  Pulse: 84  Weight: 244 lb (110.7 kg)  Height: 6\' 1"  (1.854 m)   Body mass index is 32.19 kg/m.   Wt Readings from Last 3 Encounters:  12/25/20 244 lb (110.7 kg)  11/18/20 244 lb (110.7 kg)  10/08/20 244 lb 9.6 oz (110.9 kg)     Ht Readings from Last 3 Encounters:  12/25/20 6\' 1"  (1.854 m)  11/18/20 6\' 1"  (1.854 m)  10/08/20 5\' 7"  (1.702 m)      General: The patient is awake, alert and appears not in acute distress. The patient is well groomed. Head: Normocephalic, atraumatic. Neck is supple.  Mallampati 2,  neck circumference: 18 inches . Nasal airflow  patent. Retrognathia is not seen.  Dental status: bruxism marks.  Cardiovascular:  Regular rate and cardiac rhythm by pulse,  without distended neck veins. Respiratory: Lungs are clear to auscultation.  Skin:  Without evidence of ankle edema, or rash. Trunk: The patient's posture is erect.   Neurologic exam : The patient is awake and alert, oriented to place and time.   Memory subjective described as intact.  Attention span & concentration ability appears normal.  Speech is fluent,  without  dysarthria, dysphonia or aphasia.  Mood and affect are appropriate.   Cranial nerves: no loss of smell or taste reported  Pupils are equal and briskly  reactive to light. Funduscopic exam deferred.   Extraocular movements in vertical and horizontal planes were intact and without nystagmus. No Diplopia. Visual fields by finger perimetry are intact. Hearing was intact to soft voice and finger rubbing.    Facial sensation intact to fine touch.  Facial motor strength is  symmetric and tongue and uvula move midline.  Neck ROM : rotation, tilt and flexion extension were normal for age and shoulder shrug was symmetrical.    Motor exam:  Symmetric bulk, tone and ROM.   Normal tone without cog wheeling, symmetric grip strength .   Sensory:  Fine touch and vibration were normal.  Proprioception tested in the upper extremities was normal.   Coordination: Rapid alternating movements in the fingers/hands were of normal speed.  The Finger-to-nose maneuver was intact without evidence of ataxia, dysmetria or tremor.   Gait and station: Patient could rise unassisted from a seated position, walked without assistive device.  Stance is of normal width/ base and the patient turned with 3 steps.  Toe and heel walk were deferred.  Deep tendon reflexes: in the  upper and lower extremities are symmetric and intact.  Babinski response was deferred.     The patient is currently using a CPAP machine that is 796 to 57 years old with an air sense 10 CPAP is a serial #[id number] CPAP is set to only 7 cm water pressure was 1 cm expiratory pressure relief.  He is 100% compliant by days and time with an average use at time of 7 hours 59 minutes each night.  He does not have major air leakage and the events per hour or residual AHI are 0.9.  So my goal today is to establish a new baseline.       After spending a total time of 45 minutes face to face and additional time for physical and neurologic examination, review of laboratory studies,  personal review of imaging studies, reports and results of other testing and review of referral information / records as far as provided in visit, I have established the following assessments:  1)  OSA on CPAP in a patient status post bariatric surgery, formerly 400 pounds- and lost his NASH and DM dx.,  CPAP user , bruxism, no-restorative sleep.   2)   Here to transfer care but in need of a new CPAP, may be alternative treatments, uses a nasal mask.      My Plan is to proceed with:  1) HST for new baseline and order of auto CPAP, nasal mask ESON-.  Rv in 2-4 month   Aware of supply chain problems. May look into dental device for bruxism. Dr Toni ArthursFuller.     I would like to thank  [name], Jeffrey R, Md [address] [place],  [place] [id number] for allowing me to meet with and to take care of this pleasant patient.   In short, Peyten B Mccluney Nelson is presenting with OSA on CPAP, bruxism and needs a new CPAP or alternative therapy.  I plan to follow up either personally or through our NP within 2-4 month.   CC: I will share my notes with PCP   Electronically signed by: Melvyn Novasarmen Larena Ohnemus, MD 12/25/2020 1:32 PM  [facility] certified by The [employer] of [employer] Medicine and Diplomate of the [employer]. Board certified In Neurology through the ABPN, Fellow of the [employer] of [employer]. Wellsite geologistMedical Director of [facility].

## 2020-12-25 NOTE — Patient Instructions (Signed)

## 2021-01-03 ENCOUNTER — Ambulatory Visit: Payer: BC Managed Care – PPO | Admitting: Family Medicine

## 2021-01-03 ENCOUNTER — Ambulatory Visit: Payer: Self-pay | Admitting: Family Medicine

## 2021-01-10 DIAGNOSIS — I10 Essential (primary) hypertension: Secondary | ICD-10-CM | POA: Diagnosis not present

## 2021-01-15 DIAGNOSIS — I10 Essential (primary) hypertension: Secondary | ICD-10-CM | POA: Diagnosis not present

## 2021-01-15 DIAGNOSIS — N200 Calculus of kidney: Secondary | ICD-10-CM | POA: Diagnosis not present

## 2021-01-15 DIAGNOSIS — E559 Vitamin D deficiency, unspecified: Secondary | ICD-10-CM | POA: Diagnosis not present

## 2021-01-16 ENCOUNTER — Other Ambulatory Visit: Payer: Self-pay | Admitting: Family Medicine

## 2021-01-16 DIAGNOSIS — M25561 Pain in right knee: Secondary | ICD-10-CM

## 2021-01-16 DIAGNOSIS — G8929 Other chronic pain: Secondary | ICD-10-CM

## 2021-01-20 ENCOUNTER — Other Ambulatory Visit: Payer: Self-pay

## 2021-01-20 ENCOUNTER — Ambulatory Visit: Payer: BC Managed Care – PPO | Admitting: Neurology

## 2021-01-20 DIAGNOSIS — R0681 Apnea, not elsewhere classified: Secondary | ICD-10-CM

## 2021-01-20 DIAGNOSIS — G4763 Sleep related bruxism: Secondary | ICD-10-CM

## 2021-01-20 DIAGNOSIS — G4733 Obstructive sleep apnea (adult) (pediatric): Secondary | ICD-10-CM

## 2021-01-20 DIAGNOSIS — I10 Essential (primary) hypertension: Secondary | ICD-10-CM

## 2021-01-21 NOTE — Progress Notes (Signed)
[facility]  HOME SLEEP TEST (Watch PAT)  STUDY DATE: 01/20/21  DOB: Jan 15, 1966  MRN: [id number]  ORDERING CLINICIAN: Melvyn Novas, MD   REFERRING CLINICIAN: Shade Flood, MD   CLINICAL INFORMATION/HISTORY: Juan Nelson is a 55- year -old Caucasian male patient seen here as a referral on 12/25/2020 from Dr Neva Seat for a transfer of sleep apnea care.   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 He had back surgery in 2014, was seen by Luciano Cutter, MD    The patient had the first sleep study in the year 2004-  with a result of an AHI ( Apnea Hypopnea index)  of 28/h, the patient had some periodic limb movements but this did not lead to arousals.  He had a normal cardiac rhythm according to Dr. Jetty Duhamel at the time he was referred by Dr. Dinah Beers.  This based on PSG was followed by a CPAP titration to 14 cmH2O dated 12-05-2002 the patient had snoring that was positional dependent.  He did not have central apneas only in rem sleep where there a few apneas remaining while on CPAP.  The AHI was 0.0 at 14 cm water pressure.  Epworth sleepiness score: 5/24.  BMI: 32.4 kg/m  Neck Cirfumerence: 18"  FINDINGS:   Total Record Time (hours, min): 8 H 24 min  Total Sleep Time (hours, min):  7 H 4 min   Percent REM (%):    Study was repeated on 01-29-2021   Sleep Summary  Oxygen Saturation Statistics   Start Study Time: End Study Time: Total Recording Time:         11:35:51 PM 8:00:24 AM 8 h, 24 min  Total Sleep Time % REM of  Sleep Time:  7 h, 4 min  23.5    Mean: 94 Minimum: 92 Maximum: 99  Mean of Desaturations Nadirs (%):   93  Oxygen Desatur. %: 4-9 10-20 >20 Total  Events Number Total  4 100.0  0 0.0  0 0.0  4 100.0  Oxygen Saturation: <90 <=88 <85 <80 <70  Duration (minutes): Sleep % 0.0 0.0 0.0 0.0 0.0 0.0 0.0 0.0 0.0 0.0     Respiratory Indices      Total Events REM NREM All Night  pRDI: pAHI 3%: ODI 4%: pAHIc 3%: % CSR: pAHI 4%:  33  22  4  0 0.0 3 10.9 6.6 1.8 0.0 2.8 2.0 0.2 0.0 4.7 3.1 0.6 0.0  0.4       Pulse Rate Statistics during Sleep (BPM)      Mean: 66 Minimum: 50 Maximum: 92           Body Position Statistics  Position Supine Prone Right Left Non-Supine  Sleep (min) 180.0 108.5 123.5 12.0 244.0  Sleep % 42.5 25.6 29.1 2.8 57.5  pRDI 3.7 2.8 7.3 10.0 5.4  pAHI 3% 3.0 2.2 3.9 5.0 3.2  ODI 4% 0.7 0.6 0.5 0.0 0.5     Snoring Statistics Snoring Level (dB) >40 >50 >60 >70 >80 >Threshold (45)  Sleep (min) 370.0  5.2 2.1 0.0 0.0 8.4  Sleep % 87.3 1.2 0.5 0.0 0.0 2.0    Mean: 41 dB

## 2021-01-22 ENCOUNTER — Ambulatory Visit (INDEPENDENT_AMBULATORY_CARE_PROVIDER_SITE_OTHER): Payer: BC Managed Care – PPO

## 2021-01-22 ENCOUNTER — Other Ambulatory Visit: Payer: Self-pay

## 2021-01-22 DIAGNOSIS — L501 Idiopathic urticaria: Secondary | ICD-10-CM | POA: Diagnosis not present

## 2021-01-28 ENCOUNTER — Ambulatory Visit: Payer: BC Managed Care – PPO | Admitting: Neurology

## 2021-01-28 DIAGNOSIS — G4733 Obstructive sleep apnea (adult) (pediatric): Secondary | ICD-10-CM

## 2021-01-28 DIAGNOSIS — R0681 Apnea, not elsewhere classified: Secondary | ICD-10-CM

## 2021-01-28 DIAGNOSIS — G4763 Sleep related bruxism: Secondary | ICD-10-CM

## 2021-01-28 DIAGNOSIS — I10 Essential (primary) hypertension: Secondary | ICD-10-CM

## 2021-01-28 DIAGNOSIS — Z9989 Dependence on other enabling machines and devices: Secondary | ICD-10-CM

## 2021-01-28 DIAGNOSIS — K219 Gastro-esophageal reflux disease without esophagitis: Secondary | ICD-10-CM

## 2021-01-29 ENCOUNTER — Ambulatory Visit: Payer: BC Managed Care – PPO | Admitting: Family Medicine

## 2021-01-29 ENCOUNTER — Encounter: Payer: Self-pay | Admitting: Family Medicine

## 2021-01-29 ENCOUNTER — Other Ambulatory Visit: Payer: Self-pay

## 2021-01-29 VITALS — BP 126/78 | HR 71 | Temp 98.3°F | Resp 17 | Ht 73.0 in | Wt 247.6 lb

## 2021-01-29 DIAGNOSIS — M25571 Pain in right ankle and joints of right foot: Secondary | ICD-10-CM

## 2021-01-29 DIAGNOSIS — M25561 Pain in right knee: Secondary | ICD-10-CM

## 2021-01-29 DIAGNOSIS — Z23 Encounter for immunization: Secondary | ICD-10-CM

## 2021-01-29 DIAGNOSIS — I10 Essential (primary) hypertension: Secondary | ICD-10-CM

## 2021-01-29 DIAGNOSIS — K7581 Nonalcoholic steatohepatitis (NASH): Secondary | ICD-10-CM

## 2021-01-29 DIAGNOSIS — R7303 Prediabetes: Secondary | ICD-10-CM

## 2021-01-29 DIAGNOSIS — G8929 Other chronic pain: Secondary | ICD-10-CM

## 2021-01-29 MED ORDER — TAMSULOSIN HCL 0.4 MG PO CAPS: 0.4000 mg | ORAL_CAPSULE | Freq: Every day | ORAL | 3 refills | Status: DC | PRN

## 2021-01-29 MED ORDER — DICLOFENAC SODIUM 75 MG PO TBEC: 75.0000 mg | DELAYED_RELEASE_TABLET | Freq: Two times a day (BID) | ORAL | 0 refills | Status: DC

## 2021-01-29 NOTE — Progress Notes (Signed)
Subjective:  Patient ID: Juan Nelson, male    DOB: 1966-01-20  Age: 55 y.o. MRN: [id number]  CC:  Chief Complaint  Patient presents with  . Prediabetes    Pt doing okay, reports occasional headache but does not feel this is outside of normal frequency   . Hypertension    Pt doing okay no physical sxs other than occasional headaches.   . Ankle Pain    Pt reports new ankle pain in Rt ankle starting apx 1 week ago no injury, pt reports it comes and goes. Reports sharp or aching depending     HPI Juan Nelson presents for   Prediabetes: Trying to watch diet, late night eating and staying active. Normal A1c in 07/2020.  Lab Results  Component Value Date   HGBA1C 5.3 07/05/2020   Wt Readings from Last 3 Encounters:  01/29/21 247 lb 9.6 oz (112.3 kg)  12/25/20 244 lb (110.7 kg)  11/18/20 244 lb (110.7 kg)  weight 241 in 07/2020.   Hypertension: Lisinopril 40 mg QD.  Hx ckd, followed by urology - appt with Dr. Allena Katz recently with labs. Glucose 100.  Elevated LFT's in past with NASH. [facility] - appt in 08/06/20. Steatosis without focal lesion. Stage II fibrosis in 2007, improved in 2016 on repeat bx after weight loss. 1 year follow up.  On OSA for CPAP. Referred to sleep specialist last visit - saw Dr. Vickey Huger in April. Study 5/23 - HST, AHI 3.0-6.6. O2sat 92-99%. Still using CPAP 7-8hrs per night.  Occasional mild HA - not new - every few days. Resolves quickly - plans to increase fluid intake.  Home readings: 120/70 range. No new med side effects.   BP Readings from Last 3 Encounters:  01/29/21 126/78  12/25/20 (!) 145/84  11/18/20 128/86   Lab Results  Component Value Date   CREATININE 0.93 07/05/2020   Gout: R ankle pain las week. NKI, butt worked in yard last week, noticed soreness 1-2 days later. No redness/swelling. No prior gout flare in ankle. Improving. Took diclofenac initially - last dose yesterday.  Daily meds:allopurinol 100mg  QD.  Prn med:  colchicine, diclofenac.  Lab Results  Component Value Date   LABURIC 6.7 12/15/2019      History Patient Active Problem List   Diagnosis Date Noted  . OSA on CPAP 12/25/2020  . 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  . Primary hypertension 12/25/2020  . GERD with apnea 12/25/2020  . Pain in right knee 04/27/2018  . Other seasonal allergic rhinitis 03/17/2017  . Hx of laparoscopic adjustable gastric banding 02/08/2017  . History of kidney stones 04/18/2014  . Essential hypertension 04/18/2014  . LFT elevation 10/08/2012  . Hyperglycemia 10/08/2012   Past Medical History:  Diagnosis Date  . Allergy   . Arthritis    right knee  . GERD (gastroesophageal reflux disease)   . History of kidney stones    multople stones   . Hypertension   . Kidney stone   . Sleep apnea   . Urticaria    Past Surgical History:  Procedure Laterality Date  . EXTRACORPOREAL SHOCK WAVE LITHOTRIPSY Right 11/05/2016   Procedure: RIGHT EXTRACORPOREAL SHOCK WAVE LITHOTRIPSY (ESWL);  Surgeon: 01/05/2017, MD;  Location: [facility];  Service: Urology;  Laterality: Right;  . GANGLION CYST EXCISION  1989  . LAPAROSCOPIC GASTRIC BANDING    . NEVUS EXCISION Right 04/28/2017  . SPINE SURGERY     Allergies  Allergen Reactions  . Pumpkin  Seed Hives and Swelling    Swelling of throat, tongue and lips. Swelling of throat, tongue and lips. Swelling of throat, tongue and lips.  . Sesame Seed (Diagnostic) Anaphylaxis  . Bee Venom Swelling  . Insect Extract Allergy Skin Test Hives, Itching and Swelling  . Mobic [Meloxicam] Nausea Only    "just didn't feel well"   Prior to Admission medications   Medication Sig Start Date End Date Taking? Authorizing Provider  acetaminophen (TYLENOL) 500 MG tablet Take 500 mg by mouth every 6 (six) hours as needed for headache.   Yes [provider]  allopurinol (ZYLOPRIM) 100 MG tablet TAKE 1 TABLET BY MOUTH EVERY DAY 11/29/20  Yes Shade Flood, MD  aspirin EC 81  MG tablet Take 81 mg by mouth daily.   Yes [provider]  cholecalciferol (VITAMIN D) 1000 UNITS tablet Take 5,000 Units by mouth daily.   Yes [provider]  colchicine 0.6 MG tablet Take two tabs once at the first sign of a gout flare and then one an hour later. Repeat in 24 hours if needed. Patient taking differently: Take 0.12 mg by mouth daily as needed (gout). Take two tabs once at the first sign of a gout flare and then one an hour later. Repeat in 24 hours if needed. 08/16/17  Yes Ofilia Neas, PA-C  cyclobenzaprine (FLEXERIL) 5 MG tablet Take 5 mg by mouth 3 (three) times daily as needed for muscle spasms.   Yes [provider]  diclofenac (VOLTAREN) 75 MG EC tablet TAKE 1 TABLET BY MOUTH TWICE A DAY 10/30/20  Yes Shade Flood, MD  EPINEPHrine (AUVI-Q) 0.3 mg/0.3 mL IJ SOAJ injection Use as directed for severe allergic reaction. Appointment required for future refills. 09/22/18  Yes Alfonse Spruce, MD  HYDROcodone-acetaminophen (NORCO/VICODIN) 5-325 MG tablet Take 1 tablet by mouth every 6 (six) hours as needed for moderate pain. 01/03/19  Yes Shade Flood, MD  lisinopril (ZESTRIL) 40 MG tablet TAKE 1 TABLET BY MOUTH EVERY DAY 11/29/20  Yes Shade Flood, MD  Multiple Vitamin (MULTIVITAMIN WITH MINERALS) TABS Take 1 tablet by mouth daily.   Yes [provider]  sodium bicarbonate 325 MG tablet Take 325 mg by mouth 4 (four) times daily as needed for heartburn (kidney stone).    Yes [provider]  tadalafil (CIALIS) 20 MG tablet Take 20 mg by mouth daily as needed for erectile dysfunction.  10/27/19  Yes [provider]  tamsulosin (FLOMAX) 0.4 MG CAPS capsule Take 1 tablet daily as needed for symptoms of kidney stone Patient taking differently: Take 0.4 mg by mouth daily as needed (kidney stones). Take 1 tablet daily as needed for symptoms of kidney stone 11/15/16  Yes Dione Booze, MD   Social History   Socioeconomic  History  . Marital status: Single    Spouse name: Not on file  . Number of children: Not on file  . Years of education: Not on file  . Highest education level: Doctorate  Occupational History  . Not on file  Tobacco Use  . Smoking status: Never Smoker  . Smokeless tobacco: Never Used  Vaping Use  . Vaping Use: Never used  Substance and Sexual Activity  . Alcohol use: No  . Drug use: No  . Sexual activity: Yes  Other Topics Concern  . Not on file  Social History Narrative   Lives alone   Caffeine- varies 0-1   Social Determinants of Health  Financial Resource Strain: Not on file  Food Insecurity: Not on file  Transportation Needs: Not on file  Physical Activity: Not on file  Stress: Not on file  Social Connections: Not on file  Intimate Partner Violence: Not on file    Review of Systems  Constitutional: Negative for fatigue and unexpected weight change.  Eyes: Negative for visual disturbance.  Respiratory: Negative for cough, chest tightness and shortness of breath.   Cardiovascular: Negative for chest pain, palpitations and leg swelling.  Gastrointestinal: Negative for abdominal pain and blood in stool.  Neurological: Positive for headaches (mild, resolve quickly. ). Negative for dizziness, weakness and light-headedness.     Objective:   Vitals:   01/29/21 1038  BP: 126/78  Pulse: 71  Resp: 17  Temp: 98.3 F (36.8 C)  TempSrc: Temporal  SpO2: 97%  Weight: 247 lb 9.6 oz (112.3 kg)  Height: 6\' 1"  (1.854 m)     Physical Exam Vitals reviewed.  Constitutional:      Appearance: He is well-developed.  HENT:     Head: Normocephalic and atraumatic.  Eyes:     Pupils: Pupils are equal, round, and reactive to light.  Neck:     Vascular: No carotid bruit or JVD.  Cardiovascular:     Rate and Rhythm: Normal rate and regular rhythm.     Heart sounds: Normal heart sounds. No murmur heard.   Pulmonary:     Effort: Pulmonary effort is normal.     Breath  sounds: Normal breath sounds. No rales.  Abdominal:     General: There is no distension.     Tenderness: There is no abdominal tenderness.  Musculoskeletal:     Right lower leg: No edema.     Left lower leg: No edema.     Comments: Right ankle no focal bony tenderness, Achilles nontender, minimal discomfort at the retrocalcaneal bursa with some reproduction of discomfort in this area with range of motion.  Foot nontender.  Intact range of motion.  Skin intact without erythema, no effusion..   Skin:    General: Skin is warm and dry.  Neurological:     Mental Status: He is alert and oriented to person, place, and time.      Assessment & Plan:  Juan Nelson is a 55 y.o. male . Acute right ankle pain  -Possible retrocalcaneal bursitis, improving.  Okay to continue Voltaren temporarily if needed.  Refilled.  RTC precautions given.  Unlikely gout flare based on symptoms.  No change in gout regimen for now.  Chronic pain of right knee - Plan: diclofenac (VOLTAREN) 75 MG EC tablet  Need for shingles vaccine - Plan: Varicella-zoster vaccine IM  Prediabetes - Plan: Hemoglobin A1c  -Repeat A1c's and if still stable can check yearly.  Slight hyperglycemia on recent labs from nephrology.  NASH (nonalcoholic steatohepatitis) Recent LFTs obtained from nephrology.  Continue follow-up with gastroenterology as planned.  Essential hypertension Stable on current regimen, continue same.  38-month follow-up.  Meds ordered this encounter  Medications  . diclofenac (VOLTAREN) 75 MG EC tablet    Sig: Take 1 tablet (75 mg total) by mouth 2 (two) times daily. As needed    Dispense:  180 tablet    Refill:  0    DX Code Needed  .  . tamsulosin (FLOMAX) 0.4 MG CAPS capsule    Sig: Take 1 capsule (0.4 mg total) by mouth daily as needed (kidney stones). Take 1 tablet daily as needed for symptoms  of kidney stone    Dispense:  30 capsule    Refill:  3    Needs ov   Patient Instructions  No med  changes at this time.  Ankle pain is likely a little bit of a bursitis behind the Achilles tendon.  That should continue to improve.  Short-term diclofenac is fine for now.  I will check your A1c as recent blood sugar was borderline elevated with nephrology but that has been okay in the past.  Recheck in 6 months but please let me know if there are questions sooner.        Signed, Meredith StaggersJeffrey Britnie Colville, MD [facility]

## 2021-01-29 NOTE — Patient Instructions (Addendum)
No med changes at this time.  Ankle pain is likely a little bit of a bursitis behind the Achilles tendon.  That should continue to improve.  Short-term diclofenac is fine for now.  I will check your A1c as recent blood sugar was borderline elevated with nephrology but that has been okay in the past.  Recheck in 6 months but please let me know if there are questions sooner.

## 2021-01-30 NOTE — Progress Notes (Signed)
[facility]  HOME SLEEP TEST (Watch PAT) REPORT  STUDY DATE: 01-29-21  DOB: September 16, 1965  MRN: [id number]  ORDERING CLINICIAN: Melvyn Novas, MD   REFERRING CLINICIAN: Shade Flood, MD   CLINICAL INFORMATION/HISTORY: Juan Nelson is a 55- year -old Caucasian male patient seen here as a referral on 12/25/2020 from Dr Neva Seat for a transfer of sleep apnea care.   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 He had back surgery in 2014, was seen by Luciano Cutter, MD    The patient had the first sleep study in the year 2004-  with a result of an AHI ( Apnea Hypopnea index)  of 28/h, the patient had some periodic limb movements but this did not lead to arousals.  He had a normal cardiac rhythm according to Dr. Jetty Duhamel at the time he was referred by Dr. Jeral Fruit.  PSG was followed by a CPAP titration to 14 cmH2O on 12-05-2002 . the patient had snoring that was positional dependent.  He did not have central apneas only in REM sleep were there a few apneas remaining while on CPAP.  The AHI was 0.0/h at 14 cm water pressure.  Epworth sleepiness score: 5/24.  BMI: 32.4 kg/m  Neck Circumference: 18"   FINDINGS:   Sleep Summary:   Total Recording Time (hours, min): 7 h 30 min  Total Sleep Time (hours, min):  5 h 34 min   Percent REM (%):    23.47%   Respiratory Indices:   Calculated pAHI (per hour): 19.8/hour        REM pAHI:    20.0/hour      NREM pAHI: 19.8/hour Supine AHI: Not available Snoring volume: N/A   Oxygen Saturation  Statistics:    Oxygen Saturation (%) Mean: 94%  Minimum oxygen saturation (%): 88%  O2 Saturation Range (%): 88-98%   O2 Saturation (minutes) <=88%: 0.2 min  Pulse Rate Statistics:   Pulse Mean (bpm): 60/min    Pulse Range (88-98/min)   IMPRESSION: This patient still has moderate severe OSA (obstructive sleep apnea) , at an AHI of 19.8/h but we were unable to retrieve positional or snoring data. There was no clinically relevant hypoxemia and no REM sleep dependency seen.   RECOMMENDATION: I would continue CPAP therapy if the patient agrees with this approach. He can chose the mask of his comfort, a pressure setting of 6-16 cm water will be provided and 2 cm EPR, heated humidification.  PS :This constellation of apnea would also allow for a dental device to be used, which may address his bruxism at the same time.      INTERPRETING PHYSICIAN:  Melvyn Novas, MD    [facility] Sleep Board certified by The [employer] of Sleep Medicine and Diplomate of the [employer] of Sleep Medicine. Board certified In Neurology through the ABPN, Fellow of the [employer] of Neurology. Medical Director of [employer].  Sleep Summary  Oxygen Saturation Statistics   Start Study Time: End Study Time: Total Recording Time:  12:34:50 AM 8:05:34 AM 7 h, 30 min  Total Sleep Time % REM of Sleep Time:  5 h, 34 min  23.5    Mean: 94 Minimum: 88 Maximum: 98  Mean of Desaturations Nadirs (%):   91  Oxygen Desatur. %: 4-9 10-20 >20 Total  Events Number Total  54 100.0  0 0.0  0 0.0  54 100.0  Oxygen Saturation: <90 <=88 <85 <80 <70  Duration (minutes): Sleep % 1.0 0.2 0.3 0.1 0.0 0.0 0.0 0.0 0.0 0.0     Respiratory Indices      Total Events REM NREM All Night  pRDI: pAHI 3%: 132  110 25.4 20.0 23.3 19.8 23.8 19.8  ODI 4%: pAHI 4%:  54 60 9.2 9.9 9.7 10.8       Pulse Rate Statistics during Sleep (BPM)      Mean: 60  Minimum: 50 Maximum: 85       Body Position Statistics Position Supine Prone Right Left Non-Supine  Sleep (min) 0.0 0.0 0.0 0.0 0.0  Sleep % 0.0 0.0 0.0 0.0 0.0  pRDI N/A N/A N/A N/A N/A  pAHI 3% N/A N/A N/A N/A N/A  ODI 4% N/A N/A N/A N/A N/A  Snoring Statistics Snoring Level (dB) >40 >50 >60 >70 >80 >Threshold (45)  Sleep (min) 0.0 0.0 0.0 0.0 0.0 0.0  Sleep % 0.0 0.0 0.0 0.0 0.0 0.0    Mean: N/A

## 2021-02-07 NOTE — Progress Notes (Addendum)
IMPRESSION:   THIS IS A REPEAT STUDY _ 01-29-2021: This patient still has moderate severe OSA (obstructive sleep apnea) , at an AHI of 19.8/h but we were unable to retrieve positional or snoring data. There was no clinically relevant hypoxemia and no REM sleep dependency seen.  Heart rate was between 50 and 85 bpm, a normal range, data need addendum/ correction.     RECOMMENDATION: I would continue CPAP therapy if the patient agrees with this approach. He can chose the mask of his comfort, a pressure setting of 6-16 cm water will be provided and 2 cm EPR, heated humidification.   PS :This constellation of apnea would also allow for a dental device to be used, which may address his bruxism at the same time.

## 2021-02-07 NOTE — Procedures (Signed)
[facility]  HOME SLEEP TEST (Watch PAT) REPORT  STUDY DATE: 01-29-21  DOB: 17-Aug-1966  MRN: [id number]  ORDERING CLINICIAN: Melvyn Novas, MD   REFERRING CLINICIAN: Shade Flood, MD   CLINICAL INFORMATION/HISTORY: Juan Nelson is a 55- year -old Caucasian male patient seen here as a referral on 12/25/2020 from Dr Neva Seat for a transfer of sleep apnea care.   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 He had back surgery in 2014, was seen by Luciano Cutter, MD    The patient had the first sleep study in the year 2004-  with a result of an AHI ( Apnea Hypopnea index)  of 28/h, the patient had some periodic limb movements but this did not lead to arousals.  He had a normal cardiac rhythm according to Dr. Jetty Duhamel at the time he was referred by Dr. Jeral Fruit.  PSG was followed by a CPAP titration to 14 cmH2O on 12-05-2002 . the patient had snoring that was positional dependent.  He did not have central apneas only in REM sleep were there a few apneas remaining while on CPAP.  The AHI was 0.0/h at 14 cm water pressure.  Epworth sleepiness score: 5/24.  BMI: 32.4 kg/m  Neck Circumference: 18"   FINDINGS:   Sleep Summary:   Total Recording Time (hours, min): 7 h 30 min  Total Sleep Time (hours, min):  5 h 34 min   Percent REM (%):    23.47%   Respiratory Indices:   Calculated pAHI (per hour): 19.8/hour        REM pAHI:    20.0/hour      NREM pAHI: 19.8/hour Supine AHI: Not available Snoring volume: N/A   Oxygen Saturation  Statistics:    Oxygen Saturation (%) Mean: 94%  Minimum oxygen saturation (%): 88%  O2 Saturation Range (%): 88-98%   O2 Saturation (minutes) <=88%: 0.2 min  Pulse Rate Statistics:   Pulse Mean (bpm): 60/min    Pulse Range (88-98/min)   IMPRESSION: This patient still has moderate severe OSA (obstructive sleep apnea) , at an AHI of 19.8/h but we were unable to retrieve positional or snoring data. There was no clinically relevant hypoxemia and no REM sleep dependency seen.   RECOMMENDATION: I would continue CPAP therapy if the patient agrees with this approach. He can chose the mask of his comfort, a pressure setting of 6-16 cm water will be provided and 2 cm EPR, heated humidification.  PS :This constellation of apnea would also allow for a dental device to be used, which may address his bruxism at the same time.      INTERPRETING PHYSICIAN:  Melvyn Novas, MD    [facility] and [facility] Sleep Board certified by The [employer] of Sleep Medicine and Diplomate of the [employer] of Sleep Medicine. Board certified In Neurology through the ABPN, Fellow of the [employer] of Neu[employer]. Medical Director of [employer].  Sleep Summary  Oxygen Saturation Statistics   Start Study Time: End Study Time: Total Recording Time:  12:34:50 AM 8:05:34 AM 7 h, 30 min  Total Sleep Time % REM of Sleep Time:  5 h, 34 min  23.5    Mean: 94 Minimum: 88 Maximum: 98  Mean of Desaturations Nadirs (%):   91  Oxygen Desatur. %: 4-9 10-20 >20 Total  Events Number Total  54 100.0  0 0.0  0 0.0  54 100.0  Oxygen Saturation: <90 <=88 <85 <80 <70  Duration (minutes): Sleep % 1.0 0.2 0.3 0.1 0.0 0.0 0.0 0.0 0.0 0.0     Respiratory Indices      Total Events REM NREM All Night  pRDI: pAHI 3%: 132  110 25.4 20.0 23.3 19.8 23.8 19.8  ODI 4%: pAHI 4%:  54 60 9.2 9.9 9.7 10.8       Pulse Rate Statistics during Sleep (BPM)      Mean: 60  Minimum: 50 Maximum: 85       Body Position Statistics Position Supine Prone Right Left Non-Supine  Sleep (min) 0.0 0.0 0.0 0.0 0.0  Sleep % 0.0 0.0 0.0 0.0 0.0  pRDI N/A N/A N/A N/A N/A  pAHI 3% N/A N/A N/A N/A N/A  ODI 4% N/A N/A N/A N/A N/A  Snoring Statistics Snoring Level (dB) >40 >50 >60 >70 >80 >Threshold (45)  Sleep (min) 0.0 0.0 0.0 0.0 0.0 0.0  Sleep % 0.0 0.0 0.0 0.0 0.0 0.0    Mean: N/A

## 2021-02-10 ENCOUNTER — Encounter: Payer: Self-pay | Admitting: Neurology

## 2021-02-10 ENCOUNTER — Telehealth: Payer: Self-pay | Admitting: Neurology

## 2021-02-10 NOTE — Telephone Encounter (Signed)
-----   Message from Melvyn Novas, MD sent at 02/07/2021 12:36 PM EDT ----- IMPRESSION: This patient still has moderate severe OSA (obstructive sleep apnea) , at an AHI of 19.8/h but we were unable to retrieve positional or snoring data. There was no clinically relevant hypoxemia and no REM sleep dependency seen.  Heart rate was between 50 and 85 bpm, a normal range, data need addendum/ correction.   RECOMMENDATION: I would continue CPAP therapy if the patient agrees with this approach. He can chose the mask of his comfort, a pressure setting of 6-16 cm water will be provided and 2 cm EPR, heated humidification.  PS :This constellation of apnea would also allow for a dental device to be used, which may address his bruxism at the same time.

## 2021-02-10 NOTE — Telephone Encounter (Signed)
Called patient to discuss sleep study results. No answer at this time. LVM for the patient to call back.   

## 2021-02-19 ENCOUNTER — Other Ambulatory Visit: Payer: Self-pay | Admitting: *Deleted

## 2021-02-19 MED ORDER — XOLAIR 150 MG ~~LOC~~ SOLR: 300.0000 mg | SUBCUTANEOUS | 11 refills | Status: DC

## 2021-02-26 DIAGNOSIS — N200 Calculus of kidney: Secondary | ICD-10-CM | POA: Diagnosis not present

## 2021-03-05 ENCOUNTER — Ambulatory Visit (INDEPENDENT_AMBULATORY_CARE_PROVIDER_SITE_OTHER): Payer: BC Managed Care – PPO

## 2021-03-05 ENCOUNTER — Other Ambulatory Visit: Payer: Self-pay

## 2021-03-05 DIAGNOSIS — L501 Idiopathic urticaria: Secondary | ICD-10-CM

## 2021-03-21 DIAGNOSIS — N200 Calculus of kidney: Secondary | ICD-10-CM | POA: Diagnosis not present

## 2021-04-08 DIAGNOSIS — G4733 Obstructive sleep apnea (adult) (pediatric): Secondary | ICD-10-CM | POA: Diagnosis not present

## 2021-04-16 ENCOUNTER — Other Ambulatory Visit: Payer: Self-pay

## 2021-04-16 ENCOUNTER — Ambulatory Visit (INDEPENDENT_AMBULATORY_CARE_PROVIDER_SITE_OTHER): Payer: BC Managed Care – PPO

## 2021-04-16 DIAGNOSIS — L501 Idiopathic urticaria: Secondary | ICD-10-CM | POA: Diagnosis not present

## 2021-05-09 DIAGNOSIS — G4733 Obstructive sleep apnea (adult) (pediatric): Secondary | ICD-10-CM | POA: Diagnosis not present

## 2021-05-28 ENCOUNTER — Other Ambulatory Visit: Payer: Self-pay

## 2021-05-28 ENCOUNTER — Ambulatory Visit (INDEPENDENT_AMBULATORY_CARE_PROVIDER_SITE_OTHER): Payer: BC Managed Care – PPO | Admitting: *Deleted

## 2021-05-28 DIAGNOSIS — L501 Idiopathic urticaria: Secondary | ICD-10-CM

## 2021-06-02 NOTE — Patient Instructions (Addendum)

## 2021-06-02 NOTE — Progress Notes (Signed)
PATIENT: Juan Nelson DOB: 04/09/1966  REASON FOR VISIT: follow up HISTORY FROM: patient  Chief Complaint  Patient presents with   Obstructive Sleep Apnea    Rm 11, alone. Here for initial cpap f/u. Pit reports doing well. Has been using his old mask as the new mask didn't fit well.      HISTORY OF PRESENT ILLNESS:  06/03/21 ALL:  Juan Nelson Nelson is a 55 y.o. male here today for follow up for OSA on CPAP.  HST 01/28/2021 showed OSA with AHI 19.8/hr. No hypoxemia. New CPAP machine was ordered. He is adjusting well. He is using a nasal mask. No concerns with machine or supplies.   Compliance report dated 05/05/2021-06/03/2021 shows that he used CPAP 29/30 days for greater than 4 hours for compliance of 97%. Average usage was 8 hours. Residual AHI was 0.5/hr on 6-16cmH20. No leak noted.   HISTORY: (copied from Dr Dohmeier's previous note)  Juan Nelson Nelson is a 51- year -old Caucasian male patient seen here as a referral on 12/25/2020 from Dr Neva Seat for a transfer of sleep apnea care.   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 He had back surgery in 2014, was seen by Luciano Cutter, MD    The patient had the first sleep study in the year 2004-  with a result of an AHI ( Apnea Hypopnea index)  of 28/h, the patient had some periodic limb movements but this did not lead to arousals.  He had a normal cardiac rhythm according to Dr. Jetty Duhamel at the time he was referred by Dr. Dinah Beers.  This based on PSG was followed by a CPAP titration to  14 cmH2O dated 12-05-2002 the patient had snoring that was positional dependent.  He did not have central apneas only in rem sleep where there a few apneas remaining while on CPAP.  The AHI was 0.0 at 14 cm water pressure.   Sleep relevant medical history: one nocturia on CPAP- no snoring on CPAP.    Family medical /sleep history: his father with OSA.    Social history:  Patient is working as Research officer, political party an Runner, broadcasting/film/video - choir- church related.  and lives in a household allone. 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   Tobacco use: none .  ETOH use none ,Caffeine intake in form of Coffee(/) Soda( daily) Tea ( daily*) or energy drinks. Regular exercise in form of walking.    Sleep habits are as follows: The patient's dinner time is between 6-8.30 PM. The patient goes to bed at 11-12 PM and continues to sleep for interval of 4 hours, wakes for one bathroom break.   The preferred sleep position is right and supine , with the support of 2 pillows with one additional body pillow- Dreams are reportedly rare. 8.30 AM is the usual rise time. The patient wakes up spontaneously as late as 8AM.  He reports not feeling refreshed or restored in AM, quality has ben declining-  Naps  are taken infrequently, lasting from 20-30  minutes and are more refreshing than nocturnal sleep.    REVIEW OF SYSTEMS: Out of a complete 14 system review of symptoms, the patient complains only of the following symptoms, none and all other reviewed systems are negative.  ESS: previously 5/24, now 2/24  ALLERGIES: Allergies  Allergen Reactions   Pumpkin Seed Hives and Swelling    Swelling of throat, tongue and lips. Swelling of throat, tongue and lips. Swelling of throat, tongue and lips.   Sesame Seed (Diagnostic) Anaphylaxis   Bee Venom Swelling   Insect Extract Allergy Skin Test Hives, Itching and Swelling   Mobic [Meloxicam] Nausea Only    "just didn't feel  well"    HOME MEDICATIONS: Outpatient Medications Prior to Visit  Medication Sig Dispense Refill   acetaminophen (TYLENOL) 500 MG tablet Take 500 mg by mouth every 6 (six) hours as needed for headache.     allopurinol (ZYLOPRIM) 100 MG tablet TAKE 1 TABLET BY MOUTH EVERY DAY 30 tablet 5   aspirin EC 81 MG tablet Take 81 mg by mouth daily.     CALCIUM MAGNESIUM 750 PO Take 1 tablet by mouth daily.     cholecalciferol (VITAMIN D) 1000 UNITS tablet Take 5,000 Units by mouth daily.     colchicine 0.6 MG tablet Take two tabs once at the first sign of a gout flare and then one an hour later. Repeat in 24 hours if needed. (Patient taking differently: Take 0.12 mg by mouth daily as needed (gout). Take two tabs once at the first sign of a gout flare and then one an hour later. Repeat in 24 hours if needed.) 12 tablet 1   cyclobenzaprine (FLEXERIL) 5 MG tablet Take 5 mg by mouth 3 (three) times daily as needed for muscle spasms.     diclofenac (VOLTAREN) 75 MG EC tablet Take 1 tablet (75 mg total) by mouth 2 (two) times daily. As needed 180 tablet 0   EPINEPHrine (AUVI-Q) 0.3 mg/0.3 mL IJ SOAJ injection Use as directed for severe allergic reaction. Appointment required for future refills. 2 Device 0   HYDROcodone-acetaminophen (NORCO/VICODIN) 5-325 MG tablet Take 1 tablet by mouth every 6 (six) hours as needed for moderate pain. 15 tablet 0   lisinopril (ZESTRIL) 40 MG tablet TAKE 1 TABLET BY MOUTH EVERY DAY 30 tablet 5   Multiple Vitamin (MULTIVITAMIN WITH MINERALS) TABS Take 1 tablet by mouth daily.     omalizumab (XOLAIR) 150 MG injection Inject 300 mg into the skin every 28 (twenty-eight) days. 2 each 11   sodium bicarbonate 325 MG tablet Take 325 mg by mouth 4 (four) times daily as needed for heartburn (kidney stone).      tadalafil (CIALIS) 20 MG tablet Take 20 mg by mouth daily as needed for erectile dysfunction.      tamsulosin (FLOMAX) 0.4 MG CAPS capsule Take 1 capsule (0.4 mg total) by mouth  daily as needed (kidney stones). Take 1 tablet daily as needed for symptoms of kidney stone 30 capsule 3   Facility-Administered Medications Prior to Visit  Medication Dose Route Frequency Provider Last Rate Last Admin   omalizumab Geoffry Paradise) injection 300 mg  300 mg Subcutaneous Q28 days Marcelyn Bruins, MD   300 mg at 05/28/21 1000    PAST MEDICAL HISTORY: Past Medical History:  Diagnosis Date   Allergy    Arthritis    right knee   GERD (gastroesophageal reflux disease)    History of kidney stones  multople stones    Hypertension    Kidney stone    Sleep apnea    Urticaria     PAST SURGICAL HISTORY: Past Surgical History:  Procedure Laterality Date   EXTRACORPOREAL SHOCK WAVE LITHOTRIPSY Right 11/05/2016   Procedure: RIGHT EXTRACORPOREAL SHOCK WAVE LITHOTRIPSY (ESWL);  Surgeon: Malen Gauze, MD;  Location: [facility];  Service: Urology;  Laterality: Right;   GANGLION CYST EXCISION  1989   LAPAROSCOPIC GASTRIC BANDING     NEVUS EXCISION Right 04/28/2017   SPINE SURGERY      FAMILY HISTORY: Family History  Problem Relation Age of Onset   Hypertension Mother    Diabetes Father    Cancer Maternal Grandmother    Depression Maternal Grandfather    Alzheimer's disease Paternal Grandmother    Cancer Paternal Grandfather     SOCIAL HISTORY: Social History   Socioeconomic History   Marital status: Single    Spouse name: Not on file   Number of children: Not on file   Years of education: Not on file   Highest education level: Doctorate  Occupational History   Not on file  Tobacco Use   Smoking status: Never   Smokeless tobacco: Never  Vaping Use   Vaping Use: Never used  Substance and Sexual Activity   Alcohol use: No   Drug use: No   Sexual activity: Yes  Other Topics Concern   Not on file  Social History Narrative   Lives alone   Caffeine- varies 0-1   Social Determinants of Health   Financial Resource Strain: Not on file  Food Insecurity:  Not on file  Transportation Needs: Not on file  Physical Activity: Not on file  Stress: Not on file  Social Connections: Not on file  Intimate Partner Violence: Not on file     PHYSICAL EXAM  Vitals:   06/03/21 0714  BP: (!) 151/94  Pulse: 65  Weight: 242 lb 8 oz (110 kg)  Height: 6\' 1"  (1.854 m)   Body mass index is 31.99 kg/m.  Generalized: Well developed, in no acute distress  Cardiology: normal rate and rhythm, no murmur noted Respiratory: clear to auscultation bilaterally  Neurological examination  Mentation: Alert oriented to time, place, history taking. Follows all commands speech and language fluent Cranial nerve II-XII: Pupils were equal round reactive to light. Extraocular movements were full, visual field were full  Motor: The motor testing reveals 5 over 5 strength of all 4 extremities. Good symmetric motor tone is noted throughout.  Gait and station: Gait is normal.    DIAGNOSTIC DATA (LABS, IMAGING, TESTING) - I reviewed patient records, labs, notes, testing and imaging myself where available.  No flowsheet data found.   Lab Results  Component Value Date   WBC 8.3 05/18/2020   HGB 13.3 05/18/2020   HCT 38.0 (L) 05/18/2020   MCV 89.4 05/18/2020   PLT 158 05/18/2020      Component Value Date/Time   NA 140 07/05/2020 1619   K 4.3 07/05/2020 1619   CL 103 07/05/2020 1619   CO2 26 07/05/2020 1619   GLUCOSE 94 07/05/2020 1619   GLUCOSE 100 (H) 05/18/2020 1801   BUN 12 07/05/2020 1619   CREATININE 0.93 07/05/2020 1619   CREATININE 1.05 01/24/2016 1342   CALCIUM 9.2 07/05/2020 1619   PROT 6.5 07/05/2020 1619   ALBUMIN 4.5 07/05/2020 1619   AST 30 07/05/2020 1619   ALT 32 07/05/2020 1619   ALKPHOS 115 07/05/2020 1619   BILITOT  0.4 07/05/2020 1619   GFRNONAA 92 07/05/2020 1619   GFRNONAA 82 01/24/2016 1342   GFRAA 106 07/05/2020 1619   GFRAA >89 01/24/2016 1342   Lab Results  Component Value Date   CHOL 132 07/05/2019   HDL 31 (L) 07/05/2019    LDLCALC 76 07/05/2019   TRIG 144 07/05/2019   CHOLHDL 4.3 07/05/2019   Lab Results  Component Value Date   HGBA1C 5.3 07/05/2020   Lab Results  Component Value Date   VITAMINB12 >2000 (H) 06/27/2018   Lab Results  Component Value Date   TSH 1.470 07/05/2019     ASSESSMENT AND PLAN 55 y.o. year old male  has a past medical history of Allergy, Arthritis, GERD (gastroesophageal reflux disease), History of kidney stones, Hypertension, Kidney stone, Sleep apnea, and Urticaria. here with     ICD-10-CM   1. OSA on CPAP  G47.33    Z99.89         Kofi B Murri Nelson is doing well on CPAP therapy. Compliance report reveals excellent compliance. He was encouraged to continue using CPAP nightly and for greater than 4 hours each night. Risks of untreated sleep apnea review and education materials provided. Healthy lifestyle habits encouraged. He will follow up in 1 year, sooner if needed. He verbalizes understanding and agreement with this plan.    No orders of the defined types were placed in this encounter.    No orders of the defined types were placed in this encounter.     Shawnie Dapper, FNP-C 06/03/2021, 7:23 AM [facility] [address] [place], [place] [address] [phone]

## 2021-06-03 ENCOUNTER — Ambulatory Visit: Payer: BC Managed Care – PPO | Admitting: Family Medicine

## 2021-06-03 ENCOUNTER — Encounter: Payer: Self-pay | Admitting: Family Medicine

## 2021-06-03 VITALS — BP 151/94 | HR 65 | Ht 73.0 in | Wt 242.5 lb

## 2021-06-03 DIAGNOSIS — Z9989 Dependence on other enabling machines and devices: Secondary | ICD-10-CM | POA: Diagnosis not present

## 2021-06-03 DIAGNOSIS — G4733 Obstructive sleep apnea (adult) (pediatric): Secondary | ICD-10-CM | POA: Diagnosis not present

## 2021-06-08 DIAGNOSIS — G4733 Obstructive sleep apnea (adult) (pediatric): Secondary | ICD-10-CM | POA: Diagnosis not present

## 2021-06-11 ENCOUNTER — Other Ambulatory Visit: Payer: Self-pay | Admitting: Family Medicine

## 2021-06-11 DIAGNOSIS — Z8739 Personal history of other diseases of the musculoskeletal system and connective tissue: Secondary | ICD-10-CM

## 2021-06-11 DIAGNOSIS — D2262 Melanocytic nevi of left upper limb, including shoulder: Secondary | ICD-10-CM | POA: Diagnosis not present

## 2021-06-11 DIAGNOSIS — D2261 Melanocytic nevi of right upper limb, including shoulder: Secondary | ICD-10-CM | POA: Diagnosis not present

## 2021-06-11 DIAGNOSIS — D225 Melanocytic nevi of trunk: Secondary | ICD-10-CM | POA: Diagnosis not present

## 2021-06-11 DIAGNOSIS — I10 Essential (primary) hypertension: Secondary | ICD-10-CM

## 2021-06-11 DIAGNOSIS — L820 Inflamed seborrheic keratosis: Secondary | ICD-10-CM | POA: Diagnosis not present

## 2021-06-27 DIAGNOSIS — N202 Calculus of kidney with calculus of ureter: Secondary | ICD-10-CM | POA: Diagnosis not present

## 2021-07-09 ENCOUNTER — Ambulatory Visit (INDEPENDENT_AMBULATORY_CARE_PROVIDER_SITE_OTHER): Payer: BC Managed Care – PPO

## 2021-07-09 ENCOUNTER — Other Ambulatory Visit: Payer: Self-pay

## 2021-07-09 DIAGNOSIS — L501 Idiopathic urticaria: Secondary | ICD-10-CM | POA: Diagnosis not present

## 2021-07-09 DIAGNOSIS — G4733 Obstructive sleep apnea (adult) (pediatric): Secondary | ICD-10-CM | POA: Diagnosis not present

## 2021-08-01 ENCOUNTER — Ambulatory Visit (INDEPENDENT_AMBULATORY_CARE_PROVIDER_SITE_OTHER)
Admission: RE | Admit: 2021-08-01 | Discharge: 2021-08-01 | Disposition: A | Discharge status: Home / Self Care | Payer: BC Managed Care – PPO | Source: Ambulatory Visit | Attending: Family Medicine | Admitting: Family Medicine

## 2021-08-01 ENCOUNTER — Other Ambulatory Visit: Payer: Self-pay

## 2021-08-01 ENCOUNTER — Other Ambulatory Visit (INDEPENDENT_AMBULATORY_CARE_PROVIDER_SITE_OTHER): Payer: BC Managed Care – PPO

## 2021-08-01 ENCOUNTER — Ambulatory Visit: Payer: BC Managed Care – PPO | Admitting: Family Medicine

## 2021-08-01 VITALS — BP 118/80 | HR 77 | Temp 98.4°F | Resp 16 | Ht 73.0 in | Wt 237.4 lb

## 2021-08-01 DIAGNOSIS — M549 Dorsalgia, unspecified: Secondary | ICD-10-CM

## 2021-08-01 DIAGNOSIS — R7303 Prediabetes: Secondary | ICD-10-CM | POA: Diagnosis not present

## 2021-08-01 DIAGNOSIS — Z23 Encounter for immunization: Secondary | ICD-10-CM

## 2021-08-01 DIAGNOSIS — Z87442 Personal history of urinary calculi: Secondary | ICD-10-CM

## 2021-08-01 DIAGNOSIS — M109 Gout, unspecified: Secondary | ICD-10-CM

## 2021-08-01 DIAGNOSIS — M545 Low back pain, unspecified: Secondary | ICD-10-CM | POA: Diagnosis not present

## 2021-08-01 DIAGNOSIS — I10 Essential (primary) hypertension: Secondary | ICD-10-CM

## 2021-08-01 LAB — COMPREHENSIVE METABOLIC PANEL
ALT: 31 U/L (ref 0–53)
AST: 25 U/L (ref 0–37)
Albumin: 4.5 g/dL (ref 3.5–5.2)
Alkaline Phosphatase: 82 U/L (ref 39–117)
BUN: 12 mg/dL (ref 6–23)
CO2: 25 mEq/L (ref 19–32)
Calcium: 9.3 mg/dL (ref 8.4–10.5)
Chloride: 104 mEq/L (ref 96–112)
Creatinine, Ser: 0.97 mg/dL (ref 0.40–1.50)
GFR: 87 mL/min (ref >60.00)
Glucose, Bld: 101 mg/dL — ABNORMAL HIGH (ref 70–99)
Potassium: 3.9 mEq/L (ref 3.5–5.1)
Sodium: 137 mEq/L (ref 135–145)
Total Bilirubin: 0.9 mg/dL (ref 0.2–1.2)
Total Protein: 6.9 g/dL (ref 6.0–8.3)

## 2021-08-01 LAB — URIC ACID: Uric Acid, Serum: 6.2 mg/dL (ref 4.0–7.8)

## 2021-08-01 LAB — HEMOGLOBIN A1C: Hgb A1c MFr Bld: 5.7 % (ref 4.6–6.5)

## 2021-08-01 MED ORDER — TAMSULOSIN HCL 0.4 MG PO CAPS
0.4000 mg | ORAL_CAPSULE | Freq: Every day | ORAL | 3 refills | Status: DC | PRN
Start: 2021-08-01 — End: 2021-11-03

## 2021-08-01 NOTE — Patient Instructions (Addendum)
I will check labs today. If uric acid above 6 will increase allopurinol to lessen risk of flares.  I recommend calling Dr. Cassandria Santee office to follow up on back and leg symptoms. Let me know if a referral needed. Ok to continue flomax for now, but may be back, not kidney stone.  Labs and xray at [facility].  Thanks for coming in today.   Acute Back Pain, Adult Acute back pain is sudden and usually short-lived. It is often caused by an injury to the muscles and tissues in the back. The injury may result from: A muscle, tendon, or ligament getting overstretched or torn. Ligaments are tissues that connect bones to each other. Lifting something improperly can cause a back strain. Wear and tear (degeneration) of the spinal disks. Spinal disks are circular tissue that provide cushioning between the bones of the spine (vertebrae). Twisting motions, such as while playing sports or doing yard work. A hit to the back. Arthritis. You may have a physical exam, lab tests, and imaging tests to find the cause of your pain. Acute back pain usually goes away with rest and home care. Follow these instructions at home: Managing pain, stiffness, and swelling Take over-the-counter and prescription medicines only as told by your health care provider. Treatment may include medicines for pain and inflammation that are taken by mouth or applied to the skin, or muscle relaxants. Your health care provider may recommend applying ice during the first 24-48 hours after your pain starts. To do this: Put ice in a plastic bag. Place a towel between your skin and the bag. Leave the ice on for 20 minutes, 2-3 times a day. Remove the ice if your skin turns bright red. This is very important. If you cannot feel pain, heat, or cold, you have a greater risk of damage to the area. If directed, apply heat to the affected area as often as told by your health care provider. Use the heat source that your health care provider recommends,  such as a moist heat pack or a heating pad. Place a towel between your skin and the heat source. Leave the heat on for 20-30 minutes. Remove the heat if your skin turns bright red. This is especially important if you are unable to feel pain, heat, or cold. You have a greater risk of getting burned. Activity  Do not stay in bed. Staying in bed for more than 1-2 days can delay your recovery. Sit up and stand up straight. Avoid leaning forward when you sit or hunching over when you stand. If you work at a desk, sit close to it so you do not need to lean over. Keep your chin tucked in. Keep your neck drawn back, and keep your elbows bent at a 90-degree angle (right angle). Sit high and close to the steering wheel when you drive. Add lower back (lumbar) support to your car seat, if needed. Take short walks on even surfaces as soon as you are able. Try to increase the length of time you walk each day. Do not sit, drive, or stand in one place for more than 30 minutes at a time. Sitting or standing for long periods of time can put stress on your back. Do not drive or use heavy machinery while taking prescription pain medicine. Use proper lifting techniques. When you bend and lift, use positions that put less stress on your back: [place] and [place] your knees. Keep the load close to your body. Avoid twisting. Exercise regularly as told  by your health care provider. Exercising helps your back heal faster and helps prevent back injuries by keeping muscles strong and flexible. Work with a physical therapist to make a safe exercise program, as recommended by your health care provider. Do any exercises as told by your physical therapist. Lifestyle Maintain a healthy weight. Extra weight puts stress on your back and makes it difficult to have good posture. Avoid activities or situations that make you feel anxious or stressed. Stress and anxiety increase muscle tension and can make back pain worse. Learn ways to manage  anxiety and stress, such as through exercise. General instructions Sleep on a firm mattress in a comfortable position. Try lying on your side with your knees slightly bent. If you lie on your back, put a pillow under your knees. Keep your head and neck in a straight line with your spine (neutral position) when using electronic equipment like smartphones or pads. To do this: Raise your smartphone or pad to look at it instead of bending your head or neck to look down. Put the smartphone or pad at the level of your face while looking at the screen. Follow your treatment plan as told by your health care provider. This may include: Cognitive or behavioral therapy. Acupuncture or massage therapy. Meditation or yoga. Contact a health care provider if: You have pain that is not relieved with rest or medicine. You have increasing pain going down into your legs or buttocks. Your pain does not improve after 2 weeks. You have pain at night. You lose weight without trying. You have a fever or chills. You develop nausea or vomiting. You develop abdominal pain. Get help right away if: You develop new bowel or bladder control problems. You have unusual weakness or numbness in your arms or legs. You feel faint. These symptoms may represent a serious problem that is an emergency. Do not wait to see if the symptoms will go away. Get medical help right away. Call your local emergency services (911 in the [place] Do not drive yourself to the hospital. Summary Acute back pain is sudden and usually short-lived. Use proper lifting techniques. When you bend and lift, use positions that put less stress on your back. Take over-the-counter and prescription medicines only as told by your health care provider, and apply heat or ice as told. This information is not intended to replace advice given to you by your health care provider. Make sure you discuss any questions you have with your health care provider. Document  Revised: 11/08/2020 Document Reviewed: 11/08/2020 Elsevier Patient Education  [place].

## 2021-08-01 NOTE — Progress Notes (Signed)
Subjective:  Patient ID: Juan Nelson, male    DOB: 1966/02/26  Age: 55 y.o. MRN: [id number]  CC:  Chief Complaint  Patient presents with   Prediabetes    Pt reports no changes no concerns    Hypertension    Pt reports occasional home check, doing well    kideny stone    Pt reports dealing with kideny stone currently would like a refill Flomax     HPI Juan Nelson presents for   Hypertension: Lisinopril 40 mg daily.  History of CKD followed by urology, prior elevated LFTs with NASH, followed by [facility].  Stage II fibrosis previously, improved in 2016 after weight loss.  He is also on OSA for CPAP. No new side effects with meds.  Home readings: 120/70 range.  BP Readings from Last 3 Encounters:  08/01/21 118/80  06/03/21 (!) 151/94  01/29/21 126/78   Lab Results  Component Value Date   CREATININE 0.93 07/05/2020   Gout: Last flare: few weeks ago - R toe. Took diclofenac for 3 days. 6 flairs in past year.  Daily meds: 100 mg daily. Prn med: Colchicine, diclofenac if needed. Lab Results  Component Value Date   LABURIC 6.7 12/15/2019     Prediabetes: Weight has improved.  Last testing in November 2021. Lab Results  Component Value Date   HGBA1C 5.3 07/05/2020   Wt Readings from Last 3 Encounters:  08/01/21 237 lb 6.4 oz (107.7 kg)  06/03/21 242 lb 8 oz (110 kg)  01/29/21 247 lb 9.6 oz (112.3 kg)     History of nephrolithiasis, back pain Currently taking Flomax 0.4 mg during flare. Has increased water intake.  Working well. Some R low back pain past 2 weeks - Has started back on flomax.  No leg radiation.  No bowel or bladder incontinence, no saddle anesthesia, pain behind R thigh, feels like R leg weak at times, calf smaller on R, prior back surgery. More limp at times on R leg. Limits walking at times. NS - Dr. Joya Salm prior at [facility].    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   Primary hypertension 12/25/2020   GERD with apnea 12/25/2020   Pain in right knee 04/27/2018   Other seasonal allergic rhinitis 03/17/2017   Hx of laparoscopic adjustable gastric banding 02/08/2017   History of kidney stones 04/18/2014   Essential hypertension 04/18/2014   LFT elevation 10/08/2012   Hyperglycemia 10/08/2012   Past Medical History:  Diagnosis Date   Allergy    Arthritis    right knee   GERD (gastroesophageal reflux disease)    History of kidney stones    multople stones    Hypertension    Kidney stone    Sleep apnea    Urticaria    Past Surgical History:  Procedure Laterality Date   EXTRACORPOREAL SHOCK WAVE LITHOTRIPSY Right 11/05/2016   Procedure: RIGHT EXTRACORPOREAL SHOCK WAVE LITHOTRIPSY (ESWL);  Surgeon: Cleon Gustin, MD;  Location: [facility];  Service: Urology;  Laterality: Right;   GANGLION CYST EXCISION  1989   LAPAROSCOPIC GASTRIC BANDING     NEVUS EXCISION Right 04/28/2017   SPINE SURGERY     Allergies  Allergen Reactions   Pumpkin Seed Hives and Swelling    Swelling of throat, tongue and lips. Swelling of throat, tongue and lips. Swelling of throat, tongue and lips.  Sesame Seed (Diagnostic) Anaphylaxis   Bee Venom Swelling   Insect Extract Allergy Skin Test Hives, Itching and Swelling   Mobic [Meloxicam] Nausea Only    "just didn't feel well"   Prior to Admission medications   Medication Sig Start Date End Date Taking? Authorizing Provider  acetaminophen (TYLENOL) 500 MG tablet Take 500 mg by mouth every 6 (six) hours as needed for headache.   Yes [provider]  allopurinol (ZYLOPRIM) 100 MG tablet TAKE 1 TABLET BY MOUTH EVERY DAY 06/11/21  Yes Shade Flood, MD  aspirin EC 81 MG tablet Take 81 mg by mouth daily.   Yes [provider]  calcium citrate (CALCITRATE - DOSED IN MG ELEMENTAL CALCIUM) 950 (200 Ca) MG tablet Take by mouth daily.   Yes [provider]   cholecalciferol (VITAMIN D) 1000 UNITS tablet Take 5,000 Units by mouth daily.   Yes [provider]  colchicine 0.6 MG tablet Take two tabs once at the first sign of a gout flare and then one an hour later. Repeat in 24 hours if needed. Patient taking differently: Take 0.12 mg by mouth daily as needed (gout). Take two tabs once at the first sign of a gout flare and then one an hour later. Repeat in 24 hours if needed. 08/16/17  Yes Ofilia Neas, PA-C  cyclobenzaprine (FLEXERIL) 5 MG tablet Take 5 mg by mouth 3 (three) times daily as needed for muscle spasms.   Yes [provider]  diclofenac (VOLTAREN) 75 MG EC tablet Take 1 tablet (75 mg total) by mouth 2 (two) times daily. As needed 01/29/21  Yes Shade Flood, MD  EPINEPHrine (AUVI-Q) 0.3 mg/0.3 mL IJ SOAJ injection Use as directed for severe allergic reaction. Appointment required for future refills. 09/22/18  Yes Alfonse Spruce, MD  lisinopril (ZESTRIL) 40 MG tablet TAKE 1 TABLET BY MOUTH EVERY DAY 06/11/21  Yes Shade Flood, MD  Magnesium 100 MG CAPS Take by mouth.   Yes [provider]  Multiple Vitamin (MULTIVITAMIN WITH MINERALS) TABS Take 1 tablet by mouth daily.   Yes [provider]  omalizumab Geoffry Paradise) 150 MG injection Inject 300 mg into the skin every 28 (twenty-eight) days. 02/19/21  Yes Alfonse Spruce, MD  sodium bicarbonate 325 MG tablet Take 325 mg by mouth 4 (four) times daily as needed for heartburn (kidney stone).    Yes [provider]  tadalafil (CIALIS) 20 MG tablet Take 20 mg by mouth daily as needed for erectile dysfunction.  10/27/19  Yes [provider]  tamsulosin (FLOMAX) 0.4 MG CAPS capsule Take 1 capsule (0.4 mg total) by mouth daily as needed (kidney stones). Take 1 tablet daily as needed for symptoms of kidney stone 01/29/21  Yes Shade Flood, MD  Turmeric (QC TUMERIC COMPLEX PO) Take by mouth.   Yes [provider]   Social  History   Socioeconomic History   Marital status: Single    Spouse name: Not on file   Number of children: Not on file   Years of education: Not on file   Highest education level: Doctorate  Occupational History   Not on file  Tobacco Use   Smoking status: Never   Smokeless tobacco: Never  Vaping Use   Vaping Use: Never used  Substance and Sexual Activity   Alcohol use: No   Drug use: No   Sexual activity: Yes  Other Topics Concern   Not on file  Social History Narrative  Lives alone   Caffeine- varies 0-1   Social Determinants of Health   Financial Resource Strain: Not on file  Food Insecurity: Not on file  Transportation Needs: Not on file  Physical Activity: Not on file  Stress: Not on file  Social Connections: Not on file  Intimate Partner Violence: Not on file    Review of Systems  Constitutional:  Negative for fatigue and unexpected weight change.  Eyes:  Negative for visual disturbance.  Respiratory:  Negative for cough, chest tightness and shortness of breath.   Cardiovascular:  Negative for chest pain, palpitations and leg swelling.  Gastrointestinal:  Negative for abdominal pain and blood in stool.  Neurological:  Negative for dizziness, light-headedness and headaches.    Objective:   Vitals:   08/01/21 1028  BP: 118/80  Pulse: 77  Resp: 16  Temp: 98.4 F (36.9 C)  TempSrc: Temporal  SpO2: 98%  Weight: 237 lb 6.4 oz (107.7 kg)  Height: 6\' 1"  (1.854 m)     Physical Exam Vitals reviewed.  Constitutional:      Appearance: He is well-developed.  HENT:     Head: Normocephalic and atraumatic.  Neck:     Vascular: No carotid bruit or JVD.  Cardiovascular:     Rate and Rhythm: Normal rate and regular rhythm.     Heart sounds: Normal heart sounds. No murmur heard. Pulmonary:     Effort: Pulmonary effort is normal.     Breath sounds: Normal breath sounds. No rales.  Abdominal:     General: Abdomen is flat.     Tenderness: There is no  abdominal tenderness.  Musculoskeletal:     Right lower leg: No edema.     Left lower leg: No edema.     Comments: Tender to  palpation right paraspinals, lower lumbar spine, no midline bony tenderness, negative seated straight leg raise.  Skin:    General: Skin is warm and dry.  Neurological:     Mental Status: He is alert and oriented to person, place, and time.  Psychiatric:        Mood and Affect: Mood normal.    Assessment & Plan:  Juan Nelson is a 55 y.o. male . Prediabetes - Plan: Hemoglobin A1c  -Check A1c, continue diet/exercise approach  Essential hypertension - Plan: Comprehensive metabolic panel  -Stable, no changes, check labs  Need for shingles vaccine - Plan: Varicella-zoster vaccine IM  History of nephrolithiasis - Plan: tamsulosin (FLOMAX) 0.4 MG CAPS capsule  -Flomax as needed.  Follow-up with nephrology if symptomatic.  Back pain without radiation - Plan: DG Lumbar Spine Complete  -Imaging obtained, symptomatic care discussed, recommended follow-up with his previous back specialist office and referral can be placed if needed.  RTC precautions.  No red flags on exam.  Gout, unspecified cause, unspecified chronicity, unspecified site - Plan: Uric acid  -Check uric acid, with previous flares would consider change in meds, daily med if over 6.   Meds ordered this encounter  Medications   tamsulosin (FLOMAX) 0.4 MG CAPS capsule    Sig: Take 1 capsule (0.4 mg total) by mouth daily as needed (kidney stones). Take 1 tablet daily as needed for symptoms of kidney stone    Dispense:  30 capsule    Refill:  3    Needs ov   Patient Instructions  I will check labs today. If uric acid above 6 will increase allopurinol to lessen risk of flares.  I recommend calling Dr. Harley Hallmark  office to follow up on back and leg symptoms. Let me know if a referral needed. Ok to continue flomax for now, but may be back, not kidney stone.     Signed,   Merri Ray,  MD [facility] 08/01/21 11:24 AM

## 2021-08-05 ENCOUNTER — Other Ambulatory Visit: Payer: Self-pay | Admitting: *Deleted

## 2021-08-05 MED ORDER — OMALIZUMAB 150 MG/ML ~~LOC~~ SOSY: 300.0000 mg | PREFILLED_SYRINGE | SUBCUTANEOUS | 11 refills | Status: DC

## 2021-08-08 DIAGNOSIS — G4733 Obstructive sleep apnea (adult) (pediatric): Secondary | ICD-10-CM | POA: Diagnosis not present

## 2021-08-13 ENCOUNTER — Encounter: Payer: Self-pay | Admitting: Family Medicine

## 2021-08-15 ENCOUNTER — Telehealth: Payer: BC Managed Care – PPO | Admitting: Family

## 2021-08-15 DIAGNOSIS — J069 Acute upper respiratory infection, unspecified: Secondary | ICD-10-CM

## 2021-08-15 MED ORDER — FLUTICASONE PROPIONATE 50 MCG/ACT NA SUSP: 2.0000 | Freq: Every day | NASAL | 6 refills | Status: DC

## 2021-08-15 MED ORDER — BENZONATATE 100 MG PO CAPS: 100.0000 mg | ORAL_CAPSULE | Freq: Three times a day (TID) | ORAL | 0 refills | Status: DC | PRN

## 2021-08-15 NOTE — Progress Notes (Signed)

## 2021-08-16 ENCOUNTER — Telehealth: Payer: BC Managed Care – PPO | Admitting: Nurse Practitioner

## 2021-08-16 DIAGNOSIS — U071 COVID-19: Secondary | ICD-10-CM

## 2021-08-16 NOTE — Progress Notes (Signed)
Since your COVID-19 symptoms started less than 5 days ago you may need a prescription for FDA-approved treatments (pills or IV therapy), which we cannot prescribe through an e-Visit. The best way for our providers to make a decision about which COVID treatment is right for you is through a Virtual Urgent Care Visit.  If you would like to discuss COVID therapy options with a provider, cancel this e-Visit and access a Virtual Urgent Care Visit from our Mychart menu.    E-Visit  for Positive Covid Test Result  We are sorry you are not feeling well. We are here to help!  You have tested positive for COVID-19, meaning that you were infected with the novel coronavirus and could give the virus to others.  It is vitally important that you stay home so you do not spread it to others.      Please continue isolation at home, for at least 10 days since the start of your symptoms and until you have had 24 hours with no fever (without taking a fever reducer) and with improving of symptoms.  If you have no symptoms but tested positive (or all symptoms resolve after 5 days and you have no fever) you can leave your house but continue to wear a mask around others for an additional 5 days. If you have a fever,continue to stay home until you have had 24 hours of no fever. Most cases improve 5-10 days from onset but we have seen a small number of patients who have gotten worse after the 10 days.  Please be sure to watch for worsening symptoms and remain taking the proper precautions.   Go to the nearest hospital ED for assessment if fever/cough/breathlessness are severe or illness seems like a threat to life.    The following symptoms may appear 2-14 days after exposure: Fever Cough Shortness of breath or difficulty breathing Chills Repeated shaking with chills Muscle pain Headache Sore throat New loss of taste or smell Fatigue Congestion or runny nose Nausea or vomiting Diarrhea  You have been enrolled in  MyChart Home Monitoring for COVID-19. Daily you will receive a questionnaire within the MyChart website. Our COVID-19 response team will be monitoring your responses daily.  You can continue to use the same medications you were prescribed for the URI  You may also take acetaminophen (Tylenol) as needed for fever.  HOME CARE: Only take medications as instructed by your medical team. Drink plenty of fluids and get plenty of rest. A steam or ultrasonic humidifier can help if you have congestion.   GET HELP RIGHT AWAY IF YOU HAVE EMERGENCY WARNING SIGNS.  Call 911 or proceed to your closest emergency facility if: You develop worsening high fever. Trouble breathing Bluish lips or face Persistent pain or pressure in the chest New confusion Inability to wake or stay awake You cough up blood. Your symptoms become more severe Inability to hold down food or fluids  This list is not all possible symptoms. Contact your medical provider for any symptoms that are severe or concerning to you.    Your e-visit answers were reviewed by a board certified advanced clinical practitioner to complete your personal care plan.  Depending on the condition, your plan could have included both over the counter or prescription medications.  If there is a problem please reply once you have received a response from your provider.  Your safety is important to [place].  If you have drug allergies check your prescription carefully.  You can use MyChart to ask questions about today's visit, request a non-urgent call back, or ask for a work or school excuse for 24 hours related to this e-Visit. If it has been greater than 24 hours you will need to follow up with your provider, or enter a new e-Visit to address those concerns. You will get an e-mail in the next two days asking about your experience.  I hope that your e-visit has been valuable and will speed your recovery. Thank you for using e-visits.  I spent  approximately 7 minutes reviewing the patient's history, current symptoms and coordinating their plan of care today.

## 2021-08-18 DIAGNOSIS — U071 COVID-19: Secondary | ICD-10-CM | POA: Diagnosis not present

## 2021-08-20 ENCOUNTER — Ambulatory Visit: Payer: BC Managed Care – PPO

## 2021-08-21 ENCOUNTER — Telehealth: Payer: BC Managed Care – PPO | Admitting: Physician Assistant

## 2021-08-21 DIAGNOSIS — B9789 Other viral agents as the cause of diseases classified elsewhere: Secondary | ICD-10-CM

## 2021-08-21 MED ORDER — BENZONATATE 100 MG PO CAPS: 100.0000 mg | ORAL_CAPSULE | Freq: Three times a day (TID) | ORAL | 0 refills | Status: DC | PRN

## 2021-08-21 NOTE — Progress Notes (Signed)
E-Visit for Sinus Problems  We are sorry that you are not feeling well.  Here is how we plan to help!  Based on what you have shared with me it looks like you have sinusitis.  Sinusitis is inflammation and infection in the sinus cavities of the head.  Based on your presentation I believe you most likely have Acute Viral Sinusitis secondary to COVID. This is an infection most likely caused by a virus. There is not specific treatment for viral sinusitis other than to help you with the symptoms until the infection runs its course.  You may use an oral decongestant such as Mucinex D or if you have glaucoma or high blood pressure use plain Mucinex. You want to avoid decongestants like Pseudoephedrine, Phenylephrine, etc. Saline nasal spray help and can safely be used as often as needed for congestion. Continue use of the Flonase. I will refill your Tessalon (Benzonatate) for cough.   Some authorities believe that zinc sprays or the use of Echinacea may shorten the course of your symptoms.  Sinus infections are not as easily transmitted as other respiratory infection, however we still recommend that you avoid close contact with loved ones, especially the very young and elderly.  Remember to wash your hands thoroughly throughout the day as this is the number one way to prevent the spread of infection!  Home Care: Only take medications as instructed by your medical team. Do not take these medications with alcohol. A steam or ultrasonic humidifier can help congestion.  You can place a towel over your head and breathe in the steam from hot water coming from a faucet. Avoid close contacts especially the very young and the elderly. Cover your mouth when you cough or sneeze. Always remember to wash your hands.  Get Help Right Away If: You develop worsening fever or sinus pain. You develop a severe head ache or visual changes. Your symptoms persist after you have completed your treatment plan.  Make sure  you Understand these instructions. Will watch your condition. Will get help right away if you are not doing well or get worse.   Thank you for choosing an e-visit.  Your e-visit answers were reviewed by a board certified advanced clinical practitioner to complete your personal care plan. Depending upon the condition, your plan could have included both over the counter or prescription medications.  Please review your pharmacy choice. Make sure the pharmacy is open so you can pick up prescription now. If there is a problem, you may contact your provider through [employer] and have the prescription routed to another pharmacy.  Your safety is important to [place]. If you have drug allergies check your prescription carefully.   For the next 24 hours you can use MyChart to ask questions about today's visit, request a non-urgent call back, or ask for a work or school excuse. You will get an email in the next two days asking about your experience. I hope that your e-visit has been valuable and will speed your recovery.

## 2021-08-21 NOTE — Progress Notes (Signed)
I have spent 5 minutes in review of e-visit questionnaire, review and updating patient chart, medical decision making and response to patient.   Suzzette Gasparro Cody Won Kreuzer, PA-C    

## 2021-09-01 ENCOUNTER — Ambulatory Visit (INDEPENDENT_AMBULATORY_CARE_PROVIDER_SITE_OTHER): Payer: BC Managed Care – PPO

## 2021-09-01 ENCOUNTER — Other Ambulatory Visit: Payer: Self-pay

## 2021-09-01 DIAGNOSIS — L501 Idiopathic urticaria: Secondary | ICD-10-CM

## 2021-09-04 DIAGNOSIS — Z006 Encounter for examination for normal comparison and control in clinical research program: Secondary | ICD-10-CM | POA: Diagnosis not present

## 2021-09-04 DIAGNOSIS — Z6831 Body mass index (BMI) 31.0-31.9, adult: Secondary | ICD-10-CM | POA: Diagnosis not present

## 2021-09-04 DIAGNOSIS — K219 Gastro-esophageal reflux disease without esophagitis: Secondary | ICD-10-CM | POA: Diagnosis not present

## 2021-09-04 DIAGNOSIS — I10 Essential (primary) hypertension: Secondary | ICD-10-CM | POA: Diagnosis not present

## 2021-09-04 DIAGNOSIS — Z9989 Dependence on other enabling machines and devices: Secondary | ICD-10-CM | POA: Diagnosis not present

## 2021-09-04 DIAGNOSIS — G4733 Obstructive sleep apnea (adult) (pediatric): Secondary | ICD-10-CM | POA: Diagnosis not present

## 2021-09-04 DIAGNOSIS — E119 Type 2 diabetes mellitus without complications: Secondary | ICD-10-CM | POA: Diagnosis not present

## 2021-09-04 DIAGNOSIS — E669 Obesity, unspecified: Secondary | ICD-10-CM | POA: Diagnosis not present

## 2021-09-04 DIAGNOSIS — K7581 Nonalcoholic steatohepatitis (NASH): Secondary | ICD-10-CM | POA: Diagnosis not present

## 2021-09-04 DIAGNOSIS — E785 Hyperlipidemia, unspecified: Secondary | ICD-10-CM | POA: Diagnosis not present

## 2021-09-04 DIAGNOSIS — Z87442 Personal history of urinary calculi: Secondary | ICD-10-CM | POA: Diagnosis not present

## 2021-09-08 DIAGNOSIS — G4733 Obstructive sleep apnea (adult) (pediatric): Secondary | ICD-10-CM | POA: Diagnosis not present

## 2021-10-09 DIAGNOSIS — G4733 Obstructive sleep apnea (adult) (pediatric): Secondary | ICD-10-CM | POA: Diagnosis not present

## 2021-10-14 ENCOUNTER — Other Ambulatory Visit: Payer: Self-pay

## 2021-10-14 ENCOUNTER — Ambulatory Visit: Payer: BC Managed Care – PPO | Admitting: *Deleted

## 2021-10-14 DIAGNOSIS — L501 Idiopathic urticaria: Secondary | ICD-10-CM

## 2021-10-21 ENCOUNTER — Ambulatory Visit: Payer: BC Managed Care – PPO | Admitting: Allergy & Immunology

## 2021-10-21 ENCOUNTER — Other Ambulatory Visit: Payer: Self-pay

## 2021-10-21 ENCOUNTER — Encounter: Payer: Self-pay | Admitting: Allergy & Immunology

## 2021-10-21 VITALS — BP 138/90 | HR 79 | Temp 97.9°F | Resp 18 | Ht 73.0 in | Wt 240.4 lb

## 2021-10-21 DIAGNOSIS — J3089 Other allergic rhinitis: Secondary | ICD-10-CM | POA: Diagnosis not present

## 2021-10-21 DIAGNOSIS — L501 Idiopathic urticaria: Secondary | ICD-10-CM

## 2021-10-21 DIAGNOSIS — K7581 Nonalcoholic steatohepatitis (NASH): Secondary | ICD-10-CM | POA: Diagnosis not present

## 2021-10-21 DIAGNOSIS — K760 Fatty (change of) liver, not elsewhere classified: Secondary | ICD-10-CM | POA: Diagnosis not present

## 2021-10-21 DIAGNOSIS — J302 Other seasonal allergic rhinitis: Secondary | ICD-10-CM

## 2021-10-21 MED ORDER — EPINEPHRINE 0.3 MG/0.3ML IJ SOAJ: 0.3000 mg | INTRAMUSCULAR | 2 refills | Status: DC | PRN

## 2021-10-21 NOTE — Patient Instructions (Addendum)
1. 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 - EpiPen is up to date. - Continue to avoid all of your triggering symptoms. - You can use cetirizine instead of Benadryl for the future episodes (you can even take two tablets at a time).   2. Seasonal allergic rhinitis (trees and cats) - Continue with antihistamines as needed.   3. Return in about 1 year (around 10/21/2022).    Please inform us of any Emergency Department visits, hospitalizations, or changes in symptoms. Call us before going to the ED for breathing or allergy symptoms since we might be able to fit you in for a sick visit. Feel free to contact us anytime with any questions, problems, or concerns.  It was a pleasure to see you again today!  Websites that have reliable patient information: 1. American Academy of Asthma, Allergy, and Immunology: www.aaaai.org 2. Food Allergy Research and Education (FARE): foodallergy.org 3. Mothers of Asthmatics: http://www.asthmacommunitynetwork.org 4. American College of Allergy, Asthma, and Immunology: www.acaai.org   COVID-19 Vaccine Information can be found at: [email or web address].[email or web address] For questions related to vaccine distribution or appointments, please email [email or web address] or call [phone].   We realize that you might be concerned about having an allergic reaction to the COVID19 vaccines. To help with that concern, WE ARE OFFERING THE COVID19 VACCINES IN OUR OFFICE! Ask the front desk for dates!     Like [place] on [employer] and Instagram for our latest updates!      A healthy democracy works best when [employer] participate! Make sure you are registered to vote! If you have moved or changed any of your contact information, you will need to get this updated before voting!  In some cases, you MAY be able to register to vote online:  [email or web address]

## 2021-10-21 NOTE — Progress Notes (Signed)
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 - EpiPen is up to date. - Continue to avoid all of your triggering symptoms. - You can use cetirizine instead of Benadryl for the future episodes (you can even take two tablets at a time).   2. Seasonal allergic rhinitis (trees and cats) - Continue with antihistamines as needed.   3. Return in about 1 year (around 10/21/2022).   Subjective:   Juan Nelson is a 56 y.o. 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  Juan Nelson is a 56 y.o. male presenting for a follow up visit.  Then presents for a follow-up visit.  He was last seen in February 2022.  He has been on Xolair and has been quite stable on that from an anaphylaxis standpoint.  We weaned his Xolair to every 6 weeks at the last visit.  For his allergic rhinitis, we continue with trees and cat avoidance measures as well as antihistamines.  Since last visit, he has done very well.  He has been getting his Xolair every 6 weeks and he has not noticed any worsening in his symptoms. He continues to avoid anything with hummus or anything  with seeds. He has not been interested in trying any of these triggers. He has had no problems at all.  He reports that his protein intake is still fairly good considering his avoidance of the seeds.  He is a vegetarian.  He is not really interested in weaning off of his Xolair or changing it to every 8 weeks.  Every 6 weeks seems to be working very well for him.  He would rather just continue doing this since he is doing so well with that.  He is not paying anything for his injections.  Allergic Rhinitis Symptom History: He does not take an antihistamine every day. He is taking this only as needed.  He has not needed antibiotics in quite some time.  He does not remember the last time he needed any antibiotics in fact.  Allergic rhinitis is under good control with the current regimen.  He got COVID19 in December 2022. He was already 5 days in when he was diagnosed. He did not get Paxlovid.  He was very fatigued for a long period of time but is almost back to his baseline now.  Work is largely going well.  Evidently they are having some leaking of the stone structure of the church.  This is going to be a several million dollar fix unfortunately.  Otherwise, there have been no changes to his past medical history, surgical history, family history, or social  history.    Review of Systems  Constitutional: Negative.  Negative for fever, malaise/fatigue and weight loss.  HENT: Negative.  Negative for congestion, ear discharge and ear pain.   Eyes:  Negative for pain, discharge and redness.  Respiratory:  Negative for cough, sputum production, shortness of breath and wheezing.   Cardiovascular: Negative.  Negative for chest pain and palpitations.  Gastrointestinal:  Negative for abdominal pain and heartburn.  Skin: Negative.  Negative for itching and rash.  Neurological:  Negative for dizziness and headaches.  Endo/Heme/Allergies:  Negative for environmental allergies. Does not bruise/bleed easily.       Objective:   Blood pressure 138/90, pulse 79, temperature 97.9 F (36.6 C), resp. rate 18, height 6\' 1"  (1.854 m), weight 240 lb 6.4 oz (109 kg), SpO2 98 %. Body mass index is 31.72 kg/m.    Physical Exam Vitals reviewed.  Constitutional:      Appearance: He is well-developed.     Comments: Pleasant male.  Cooperative with the exam.  HENT:     Head: Normocephalic and atraumatic.     Right Ear: Tympanic membrane, ear canal and external ear normal.     Left Ear: Tympanic membrane, ear canal and external ear normal.     Nose: No nasal deformity, septal deviation, mucosal edema or rhinorrhea.     Right Turbinates: Enlarged and swollen.     Left Turbinates: Enlarged and swollen.     Right Sinus: No maxillary sinus tenderness or frontal sinus tenderness.     Left Sinus: No maxillary sinus tenderness or frontal sinus tenderness.     Mouth/Throat:     Mouth: Mucous membranes are not pale and not dry.     Pharynx: Uvula midline.  Eyes:     General:        Right eye: No discharge.        Left eye: No discharge.     Conjunctiva/sclera: Conjunctivae normal.     Right eye: Right conjunctiva is not injected. No chemosis.    Left eye: Left conjunctiva is not injected. No chemosis.    Pupils: Pupils are equal, round, and reactive to light.  Cardiovascular:     Rate and Rhythm: Normal rate and regular rhythm.     Heart sounds: Normal heart sounds.  Pulmonary:     Effort: Pulmonary effort is normal. No tachypnea, accessory muscle usage or respiratory distress.     Breath sounds: Normal breath sounds. No wheezing, rhonchi or rales.     Comments: Moving air well in all lung fields. Chest:     Chest wall: No tenderness.  Lymphadenopathy:     Cervical: No cervical adenopathy.  Skin:    Coloration: Skin is not pale.     Findings: No abrasion, erythema, petechiae or rash. Rash is not papular, urticarial or vesicular.  Neurological:     Mental Status: He is alert.  Psychiatric:         Behavior: Behavior is cooperative.     Diagnostic studies: none      Salvatore Marvel, MD  [facility]

## 2021-11-01 ENCOUNTER — Other Ambulatory Visit: Payer: Self-pay | Admitting: Family Medicine

## 2021-11-01 DIAGNOSIS — Z87442 Personal history of urinary calculi: Secondary | ICD-10-CM

## 2021-11-06 DIAGNOSIS — G4733 Obstructive sleep apnea (adult) (pediatric): Secondary | ICD-10-CM | POA: Diagnosis not present

## 2021-11-25 ENCOUNTER — Other Ambulatory Visit: Payer: Self-pay

## 2021-11-25 ENCOUNTER — Ambulatory Visit (INDEPENDENT_AMBULATORY_CARE_PROVIDER_SITE_OTHER): Payer: BC Managed Care – PPO

## 2021-11-25 DIAGNOSIS — L501 Idiopathic urticaria: Secondary | ICD-10-CM | POA: Diagnosis not present

## 2021-11-30 ENCOUNTER — Other Ambulatory Visit: Payer: Self-pay | Admitting: Family Medicine

## 2021-11-30 DIAGNOSIS — Z8739 Personal history of other diseases of the musculoskeletal system and connective tissue: Secondary | ICD-10-CM

## 2021-11-30 DIAGNOSIS — I10 Essential (primary) hypertension: Secondary | ICD-10-CM

## 2021-12-07 DIAGNOSIS — G4733 Obstructive sleep apnea (adult) (pediatric): Secondary | ICD-10-CM | POA: Diagnosis not present

## 2022-01-05 DIAGNOSIS — G4733 Obstructive sleep apnea (adult) (pediatric): Secondary | ICD-10-CM | POA: Diagnosis not present

## 2022-01-06 ENCOUNTER — Ambulatory Visit (INDEPENDENT_AMBULATORY_CARE_PROVIDER_SITE_OTHER): Payer: BC Managed Care – PPO

## 2022-01-06 DIAGNOSIS — L501 Idiopathic urticaria: Secondary | ICD-10-CM | POA: Diagnosis not present

## 2022-01-12 DIAGNOSIS — E785 Hyperlipidemia, unspecified: Secondary | ICD-10-CM | POA: Diagnosis not present

## 2022-01-14 DIAGNOSIS — I10 Essential (primary) hypertension: Secondary | ICD-10-CM | POA: Diagnosis not present

## 2022-01-14 DIAGNOSIS — N200 Calculus of kidney: Secondary | ICD-10-CM | POA: Diagnosis not present

## 2022-01-14 DIAGNOSIS — E559 Vitamin D deficiency, unspecified: Secondary | ICD-10-CM | POA: Diagnosis not present

## 2022-01-23 ENCOUNTER — Telehealth: Payer: Self-pay

## 2022-01-23 NOTE — Telephone Encounter (Signed)
Given to Dr. Carlota Raspberry to review. Once reviewed the [facility] summary plan will be scanned.

## 2022-02-05 DIAGNOSIS — G4733 Obstructive sleep apnea (adult) (pediatric): Secondary | ICD-10-CM | POA: Diagnosis not present

## 2022-02-17 ENCOUNTER — Ambulatory Visit (INDEPENDENT_AMBULATORY_CARE_PROVIDER_SITE_OTHER): Payer: BC Managed Care – PPO

## 2022-02-17 DIAGNOSIS — L501 Idiopathic urticaria: Secondary | ICD-10-CM

## 2022-02-23 ENCOUNTER — Other Ambulatory Visit: Payer: Self-pay | Admitting: Family Medicine

## 2022-02-23 DIAGNOSIS — Z87442 Personal history of urinary calculi: Secondary | ICD-10-CM

## 2022-02-26 DIAGNOSIS — N281 Cyst of kidney, acquired: Secondary | ICD-10-CM | POA: Diagnosis not present

## 2022-02-26 DIAGNOSIS — N200 Calculus of kidney: Secondary | ICD-10-CM | POA: Diagnosis not present

## 2022-03-07 DIAGNOSIS — G4733 Obstructive sleep apnea (adult) (pediatric): Secondary | ICD-10-CM | POA: Diagnosis not present

## 2022-03-26 ENCOUNTER — Encounter: Payer: Self-pay | Admitting: Family Medicine

## 2022-03-26 ENCOUNTER — Ambulatory Visit (INDEPENDENT_AMBULATORY_CARE_PROVIDER_SITE_OTHER)
Admission: RE | Admit: 2022-03-26 | Discharge: 2022-03-26 | Disposition: A | Discharge status: Home / Self Care | Payer: BC Managed Care – PPO | Source: Ambulatory Visit | Attending: Family Medicine | Admitting: Family Medicine

## 2022-03-26 ENCOUNTER — Ambulatory Visit: Payer: BC Managed Care – PPO | Admitting: Family Medicine

## 2022-03-26 VITALS — BP 122/70 | HR 87 | Temp 98.1°F | Resp 16 | Ht 73.0 in | Wt 238.8 lb

## 2022-03-26 DIAGNOSIS — M79672 Pain in left foot: Secondary | ICD-10-CM | POA: Diagnosis not present

## 2022-03-26 DIAGNOSIS — M19072 Primary osteoarthritis, left ankle and foot: Secondary | ICD-10-CM | POA: Diagnosis not present

## 2022-03-26 DIAGNOSIS — M25562 Pain in left knee: Secondary | ICD-10-CM

## 2022-03-26 DIAGNOSIS — R7303 Prediabetes: Secondary | ICD-10-CM | POA: Diagnosis not present

## 2022-03-26 DIAGNOSIS — M109 Gout, unspecified: Secondary | ICD-10-CM

## 2022-03-26 DIAGNOSIS — I10 Essential (primary) hypertension: Secondary | ICD-10-CM | POA: Diagnosis not present

## 2022-03-26 DIAGNOSIS — M7989 Other specified soft tissue disorders: Secondary | ICD-10-CM | POA: Diagnosis not present

## 2022-03-26 LAB — POCT GLYCOSYLATED HEMOGLOBIN (HGB A1C): Hemoglobin A1C: 5.9 % — AB (ref 4.0–5.6)

## 2022-03-26 LAB — GLUCOSE, POCT (MANUAL RESULT ENTRY): POC Glucose: 122 mg/dl — AB (ref 70–99)

## 2022-03-26 MED ORDER — METFORMIN HCL 500 MG PO TABS: 500.0000 mg | ORAL_TABLET | Freq: Every day | ORAL | 3 refills | Status: DC

## 2022-03-26 NOTE — Patient Instructions (Addendum)
If blood sugars remain elevated can start metformin once per day.  Recheck blood sugar with other lab work in 3 months. Glad to hear foot and knee are better but I will order some x-rays, especially based on location of pain in your foot.  If any concerns on his x-rays I will let you know.  No new medications or treatments for now as long as his areas continue to improve.  Let me know if that changes.   Healthy snacks and see other ways to manage stress below to see if that lessens stress/emotional eating. Return to the clinic or go to the nearest emergency room if any of your symptoms worsen or new symptoms occur.  Managing Stress, Adult Feeling a certain amount of stress is normal. Stress helps our body and mind get ready to deal with the demands of life. Stress hormones can motivate you to do well at work and meet your responsibilities. But severe or long-term (chronic) stress can affect your mental and physical health. Chronic stress puts you at higher risk for: Anxiety and depression. Other health problems such as digestive problems, muscle aches, heart disease, high blood pressure, and stroke. What are the causes? Common causes of stress include: Demands from work, such as deadlines, feeling overworked, or having long hours. Pressures at home, such as money issues, disagreements with a spouse, or parenting issues. Pressures from major life changes, such as divorce, moving, loss of a loved one, or chronic illness. You may be at higher risk for stress-related problems if you: Do not get enough sleep. Are in poor health. Do not have emotional support. Have a mental health disorder such as anxiety or depression. How to recognize stress Stress can make you: Have trouble sleeping. Feel sad, anxious, irritable, or overwhelmed. Lose your appetite. Overeat or want to eat unhealthy foods. Want to use drugs or alcohol. Stress can also cause physical symptoms, such as: Sore, tense muscles,  especially in the shoulders and neck. Headaches. Trouble breathing. A faster heart rate. Stomach pain, nausea, or vomiting. Diarrhea or constipation. Trouble concentrating. Follow these instructions at home: Eating and drinking Eat a healthy diet. This includes: Eating foods that are high in fiber, such as beans, whole grains, and fresh fruits and vegetables. Limiting foods that are high in fat and processed sugars, such as fried or sweet foods. Do not skip meals or overeat. Drink enough fluid to keep your urine pale yellow. Alcohol use Do not drink alcohol if: Your health care provider tells you not to drink. You are pregnant, may be pregnant, or are planning to become pregnant. Drinking alcohol is a way some people try to ease their stress. This can be dangerous, so if you drink alcohol: Limit how much you have to: 0-1 drink a day for women. 0-2 drinks a day for men. Know how much alcohol is in your drink. In the [place] one drink equals one 12 oz bottle of beer (355 mL), one 5 oz glass of wine (148 mL), or one 1 oz glass of hard liquor (44 mL). Activity  Include 30 minutes of exercise in your daily schedule. Exercise is a good stress reducer. Include time in your day for an activity that you find relaxing. Try taking a walk, going on a bike ride, reading a book, or listening to music. Schedule your time in a way that lowers stress, and keep a regular schedule. Focus on doing what is most important to get done. Lifestyle Identify the source of your  stress and your reaction to it. See a therapist who can help you change unhelpful reactions. When there are stressful events: Talk about them with family, friends, or coworkers. Try to think realistically about stressful events and not ignore them or overreact. Try to find the positives in a stressful situation and not focus on the negatives. Cut back on responsibilities at work and home, if possible. Ask for help from friends or family  members if you need it. Find ways to manage stress, such as: Mindfulness, meditation, or deep breathing. Yoga or tai chi. Progressive muscle relaxation. Spending time in nature. Doing art, playing music, or reading. Making time for fun activities. Spending time with family and friends. Get support from family, friends, or spiritual resources. General instructions Get enough sleep. Try to go to sleep and get up at about the same time every day. Take over-the-counter and prescription medicines only as told by your health care provider. Do not use any products that contain nicotine or tobacco. These products include cigarettes, chewing tobacco, and vaping devices, such as e-cigarettes. If you need help quitting, ask your health care provider. Do not use drugs or smoke to deal with stress. Keep all follow-up visits. This is important. Where to find support Talk with your health care provider about stress management or finding a support group. Find a therapist to work with you on your stress management techniques. Where to find more information [employer] on Mental Illness: www.nami.org American Psychological Association: TVStereos.ch Contact a health care provider if: Your stress symptoms get worse. You are unable to manage your stress at home. You are struggling to stop using drugs or alcohol. Get help right away if: You may be a danger to yourself or others. You have any thoughts of death or suicide. Get help right awayif you feel like you may hurt yourself or others, or have thoughts about taking your own life. Go to your nearest emergency room or: Call 911. Call the [facility] at (906) 768-5700 or 988 in the [place] This is open 24 hours a day. Text the Crisis Text Line at 386-145-8551. Summary Feeling a certain amount of stress is normal, but severe or long-term (chronic) stress can affect your mental and physical health. Chronic stress can put you at  higher risk for anxiety, depression, and other health problems such as digestive problems, muscle aches, heart disease, high blood pressure, and stroke. You may be at higher risk for stress-related problems if you do not get enough sleep, are in poor health, lack emotional support, or have a mental health disorder such as anxiety or depression. Identify the source of your stress and your reaction to it. Try talking about stressful events with family, friends, or coworkers, finding a coping method, or getting support from spiritual resources. If you need more help, talk with your health care provider about finding a support group or a mental health therapist. This information is not intended to replace advice given to you by your health care provider. Make sure you discuss any questions you have with your health care provider. Document Revised: 03/13/2021 Document Reviewed: 03/11/2021 Elsevier Patient Education  [place].  Prediabetes Prediabetes is when your blood sugar (blood glucose) level is higher than normal but not high enough for you to be diagnosed with type 2 diabetes. Having prediabetes puts you at risk for developing type 2 diabetes (type 2 diabetes mellitus). With certain lifestyle changes, you may be able to prevent or delay the onset of  type 2 diabetes. This is important because type 2 diabetes can lead to serious complications, such as: Heart disease. Stroke. Blindness. Kidney disease. Depression. Poor circulation in the feet and legs. In severe cases, this could lead to surgical removal of a leg (amputation). What are the causes? The exact cause of prediabetes is not known. It may result from insulin resistance. Insulin resistance develops when cells in the body do not respond properly to insulin that the body makes. This can cause excess glucose to build up in the blood. High blood glucose (hyperglycemia) can develop. What increases the risk? The following factors may  make you more likely to develop this condition: You have a family member with type 2 diabetes. You are older than 45 years. You had a temporary form of diabetes during a pregnancy (gestational diabetes). You had polycystic ovary syndrome (PCOS). You are overweight or obese. You are inactive (sedentary). You have a history of heart disease, including problems with cholesterol levels, high levels of blood fats, or high blood pressure. What are the signs or symptoms? You may have no symptoms. If you do have symptoms, they may include: Increased hunger. Increased thirst. Increased urination. Vision changes, such as blurry vision. Tiredness (fatigue). How is this diagnosed? This condition can be diagnosed with blood tests. Your blood glucose may be checked with one or more of the following tests: A fasting blood glucose (FBG) test. You will not be allowed to eat (you will fast) for at least 8 hours before a blood sample is taken. An A1C blood test (hemoglobin A1C). This test provides information about blood glucose levels over the previous 2?3 months. An oral glucose tolerance test (OGTT). This test measures your blood glucose at two points in time: After fasting. This is your baseline level. Two hours after you drink a beverage that contains glucose. You may be diagnosed with prediabetes if: Your FBG is 100?125 mg/dL (5.6-6.9 mmol/L). Your A1C level is 5.7?6.4% (39-46 mmol/mol). Your OGTT result is 140?199 mg/dL (7.8-11 mmol/L). These blood tests may be repeated to confirm your diagnosis. How is this treated? Treatment may include dietary and lifestyle changes to help lower your blood glucose and prevent type 2 diabetes from developing. In some cases, medicine may be prescribed to help lower the risk of type 2 diabetes. Follow these instructions at home: Nutrition  Follow a healthy meal plan. This includes eating lean proteins, whole grains, legumes, fresh fruits and vegetables, low-fat  dairy products, and healthy fats. Follow instructions from your health care provider about eating or drinking restrictions. Meet with a dietitian to create a healthy eating plan that is right for you. Lifestyle Do moderate-intensity exercise for at least 30 minutes a day on 5 or more days each week, or as told by your health care provider. A mix of activities may be best, such as: Brisk walking, swimming, biking, and weight lifting. Lose weight as told by your health care provider. Losing 5-7% of your body weight can reverse insulin resistance. Do not drink alcohol if: Your health care provider tells you not to drink. You are pregnant, may be pregnant, or are planning to become pregnant. If you drink alcohol: Limit how much you use to: 0-1 drink a day for women. 0-2 drinks a day for men. Be aware of how much alcohol is in your drink. In the [place] one drink equals one 12 oz bottle of beer (355 mL), one 5 oz glass of wine (148 mL), or one 1 oz glass of  hard liquor (44 mL). General instructions Take over-the-counter and prescription medicines only as told by your health care provider. You may be prescribed medicines that help lower the risk of type 2 diabetes. Do not use any products that contain nicotine or tobacco, such as cigarettes, e-cigarettes, and chewing tobacco. If you need help quitting, ask your health care provider. Keep all follow-up visits. This is important. Where to find more information American Diabetes Association: www.diabetes.org Academy of Nutrition and Dietetics: www.eatright.org American Heart Association: www.heart.org Contact a health care provider if: You have any of these symptoms: Increased hunger. Increased urination. Increased thirst. Fatigue. Vision changes, such as blurry vision. Get help right away if you: Have shortness of breath. Feel confused. Vomit or feel like you may vomit. Summary Prediabetes is when your blood sugar (blood glucose)level is  higher than normal but not high enough for you to be diagnosed with type 2 diabetes. Having prediabetes puts you at risk for developing type 2 diabetes (type 2 diabetes mellitus). Make lifestyle changes such as eating a healthy diet and exercising regularly to help prevent diabetes. Lose weight as told by your health care provider. This information is not intended to replace advice given to you by your health care provider. Make sure you discuss any questions you have with your health care provider. Document Revised: 11/16/2019 Document Reviewed: 11/16/2019 Elsevier Patient Education  [place].

## 2022-03-26 NOTE — Progress Notes (Addendum)
Subjective:  Patient ID: Juan Nelson, male    DOB: 1966-03-20  Age: 56 y.o. MRN: [id number]  CC:  Chief Complaint  Patient presents with   Leg Pain    Pt reports Lt leg and lt foot pain starting apx 1 month ago, notes does go to the gym regularly, no abnormal sounds from the knee or foot at the time    Diabetes    Pt reports morning readings higher at 149-159 which is abnormal, wanted to discuss this today,    Depression    PHQ-9=4    HPI Marshun B Creed Nelson presents for   L knee pain Noticed with increased weight during exercise about a month ago. Sore to bend initially, no swelling. Topical voltaren for awhile. No limitiation in walking, no mechanical symptoms. Cut back on leg weights, but still walking, biking at gym.  Lower left middle foot also sore at the time, now better - pain free to walk.  No current treatments. No prior knee injections or surgery. Hx of knee arthritis seen by Dr. Alvan Dame years ago.   Prediabetes: Weight overall stable, down a few pounds from February.  A1c 5.5 in January, 5.7 in December. Home readings up to 149 in the morning - higher readings past few weeks.   Lab Results  Component Value Date   HGBA1C 5.9 (A) 03/26/2022   Wt Readings from Last 3 Encounters:  03/26/22 238 lb 12.8 oz (108.3 kg)  10/21/21 240 lb 6.4 oz (109 kg)  08/01/21 237 lb 6.4 oz (107.7 kg)   Hypertension: Lisinopril 13m qd. Hx of elevated lft with NASH, followed by specialist. on CPAP for OSA. No new side effects with meds.  Home readings: BP Readings from Last 3 Encounters:  03/26/22 122/70  10/21/21 138/90  08/01/21 118/80   Lab Results  Component Value Date   CREATININE 0.97 08/01/2021   Gout: Last flare:none.  Daily meds:allopurinol 1044mqd.  Prn med: colchicine and diclofenac if needed.  Flomax if flare of kidney stone only - recent nephrology eval reassuring.  Lab Results  Component Value Date   LABURIC 6.2 08/01/2021      Positive  depression screening Some stress at times. Able to breathe through. Not feeling depressed mood, no anhedonia. Some overeating at times, some emotional eating at times.     03/26/2022    8:24 AM 08/01/2021   10:34 AM 01/29/2021   10:49 AM 11/18/2020   11:44 AM 07/05/2020    2:42 PM  Depression screen PHQ 2/9  Decreased Interest 0 1 0 0 0  Down, Depressed, Hopeless _0 0 0  PHQ - 2 Score _1 0 0  Altered sleeping 0 1 0    Tired, decreased energy _2 Change in appetite _3 Feeling bad or failure about yourself  0 0 0    Trouble concentrating 0 0 0    Moving slowly or fidgety/restless 0 0 0    Suicidal thoughts 0 0 0    PHQ-9 Score _4 Difficult doing work/chores  Not difficult at all       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   Primary hypertension 12/25/2020   GERD with apnea 12/25/2020   Pain in right knee 04/27/2018   Other seasonal allergic rhinitis 03/17/2017   Hx  of laparoscopic adjustable gastric banding 02/08/2017   History of kidney stones 04/18/2014   Essential hypertension 04/18/2014   LFT elevation 10/08/2012   Hyperglycemia 10/08/2012   Past Medical History:  Diagnosis Date   Allergy    Arthritis    right knee   GERD (gastroesophageal reflux disease)    History of kidney stones    multople stones    Hypertension    Kidney stone    Sleep apnea    Urticaria    Past Surgical History:  Procedure Laterality Date   EXTRACORPOREAL SHOCK WAVE LITHOTRIPSY Right 11/05/2016   Procedure: RIGHT EXTRACORPOREAL SHOCK WAVE LITHOTRIPSY (ESWL);  Surgeon: Cleon Gustin, MD;  Location: [facility];  Service: Urology;  Laterality: Right;   GANGLION CYST EXCISION  1989   LAPAROSCOPIC GASTRIC BANDING     NEVUS EXCISION Right 04/28/2017   SPINE SURGERY     Allergies  Allergen Reactions   Pumpkin Seed Hives and Swelling    Swelling of throat, tongue and lips. Swelling of  throat, tongue and lips. Swelling of throat, tongue and lips.   Sesame Seed (Diagnostic) Anaphylaxis   Bee Venom Swelling   Insect Extract Hives, Itching and Swelling   Mobic [Meloxicam] Nausea Only    "just didn't feel well"   Prior to Admission medications   Medication Sig Start Date End Date Taking? Authorizing Provider  acetaminophen (TYLENOL) 500 MG tablet Take 500 mg by mouth every 6 (six) hours as needed for headache.   Yes [provider]  allopurinol (ZYLOPRIM) 100 MG tablet TAKE 1 TABLET BY MOUTH EVERY DAY 12/02/21  Yes Wendie Agreste, MD  aspirin EC 81 MG tablet Take 81 mg by mouth daily.   Yes [provider]  calcium citrate (CALCITRATE - DOSED IN MG ELEMENTAL CALCIUM) 950 (200 Ca) MG tablet Take by mouth daily.   Yes [provider]  cholecalciferol (VITAMIN D) 1000 UNITS tablet Take 5,000 Units by mouth daily.   Yes [provider]  colchicine 0.6 MG tablet Take two tabs once at the first sign of a gout flare and then one an hour later. Repeat in 24 hours if needed. Patient taking differently: Take 0.12 mg by mouth daily as needed (gout). Take two tabs once at the first sign of a gout flare and then one an hour later. Repeat in 24 hours if needed. 08/16/17  Yes Tereasa Coop, PA-C  cyclobenzaprine (FLEXERIL) 5 MG tablet Take 5 mg by mouth 3 (three) times daily as needed for muscle spasms.   Yes [provider]  diclofenac (VOLTAREN) 75 MG EC tablet Take 1 tablet (75 mg total) by mouth 2 (two) times daily. As needed 01/29/21  Yes Wendie Agreste, MD  EPINEPHrine (AUVI-Q) 0.3 mg/0.3 mL IJ SOAJ injection Inject 0.3 mg into the muscle as needed for anaphylaxis. 10/21/21  Yes Valentina Shaggy, MD  fluticasone [facility]) 50 MCG/ACT nasal spray Place 2 sprays into both nostrils daily. 08/15/21  Yes Hawks, Alyse Low A, FNP  lisinopril (ZESTRIL) 40 MG tablet TAKE 1 TABLET BY MOUTH EVERY DAY 12/02/21  Yes Wendie Agreste, MD  Magnesium 100  MG CAPS Take by mouth.   Yes [provider]  Multiple Vitamin (MULTIVITAMIN WITH MINERALS) TABS Take 1 tablet by mouth daily.   Yes [provider]  omalizumab Arvid Right) 150 MG/ML prefilled syringe Inject 300 mg into the skin every 28 (twenty-eight) days. 08/05/21  Yes Valentina Shaggy, MD  tadalafil (CIALIS) 20 MG tablet  Take 20 mg by mouth daily as needed for erectile dysfunction.  10/27/19  Yes [provider]  tamsulosin (FLOMAX) 0.4 MG CAPS capsule TAKE 1 CAP DAILY AS NEEDED FOR SYMPTOMS OF KIDNEY STONE 02/23/22  Yes Wendie Agreste, MD  Turmeric (QC TUMERIC COMPLEX PO) Take by mouth.   Yes [provider]   Social History   Socioeconomic History   Marital status: Single    Spouse name: Not on file   Number of children: Not on file   Years of education: Not on file   Highest education level: Doctorate  Occupational History   Not on file  Tobacco Use   Smoking status: Never   Smokeless tobacco: Never  Vaping Use   Vaping Use: Never used  Substance and Sexual Activity   Alcohol use: No   Drug use: No   Sexual activity: Yes  Other Topics Concern   Not on file  Social History Narrative   Lives alone   Caffeine- varies 0-1   Social Determinants of Health   Financial Resource Strain: Not on file  Food Insecurity: Not on file  Transportation Needs: Not on file  Physical Activity: Not on file  Stress: Not on file  Social Connections: Not on file  Intimate Partner Violence: Not on file    Review of Systems  Constitutional:  Negative for fatigue and unexpected weight change.  Eyes:  Negative for visual disturbance.  Respiratory:  Negative for cough, chest tightness and shortness of breath.   Cardiovascular:  Negative for chest pain, palpitations and leg swelling.  Gastrointestinal:  Negative for abdominal pain and blood in stool.  Neurological:  Negative for dizziness, light-headedness (only if standing quickly after taking meds. no  changes.) and headaches.     Objective:   Vitals:   03/26/22 0821  BP: 122/70  Pulse: 87  Resp: 16  Temp: 98.1 F (36.7 C)  TempSrc: Temporal  SpO2: 97%  Weight: 238 lb 12.8 oz (108.3 kg)  Height: _0  (1.854 m)     Physical Exam Vitals reviewed.  Constitutional:      Appearance: He is well-developed.  HENT:     Head: Normocephalic and atraumatic.  Neck:     Vascular: No carotid bruit or JVD.  Cardiovascular:     Rate and Rhythm: Normal rate and regular rhythm.     Heart sounds: Normal heart sounds. No murmur heard. Pulmonary:     Effort: Pulmonary effort is normal.     Breath sounds: Normal breath sounds. No rales.  Musculoskeletal:     Right lower leg: No edema.     Left lower leg: No edema.     Comments: Left knee, full range of motion, tight feeling at terminal flexion.  Minimal discomfort over the lateral greater than medial joint line.  Trace to minimal effusion present.  Skin intact, no erythema.  Negative McMurray, varus and valgus stress testing.  Left foot, slight tenderness over the fifth metatarsal styloid and proximal fifth metatarsal.  Remainder of foot, ankle nontender.  Skin:    General: Skin is warm and dry.  Neurological:     Mental Status: He is alert and oriented to person, place, and time.  Psychiatric:        Mood and Affect: Mood normal.        Assessment & Plan:  RON BESKE Nelson is a 56 y.o. male . Prediabetes - Plan: POCT glucose (manual entry), POCT glycosylated hemoglobin (Hb A1C), metFORMIN (GLUCOPHAGE) 500  MG tablet  -Recent increased readings, option to start metformin if persistent elevated fasting readings.  Recheck in 3 months with repeat A1c.  Handout given.  RTC precautions.  -Handout given on stress management, healthy snacks discussed to minimize stress eating or emotional eating impact on blood sugar.  Essential hypertension  -Stable, no med changes for now.  RTC precautions if increasing lightheadedness.   Orthostasis precautions given  Acute pain of left knee  -Bilateral joint lines, lateral greater than medial.  Good range of motion, no mechanical symptoms, overall improving.  X-ray to evaluate degree of osteoarthritis cause for given prior history of eval with Ortho.  RTC precautions if worsening.  Left foot pain  -Improved, but based on location of pain will check x-ray as some discomfort over the metaphyseal/diaphyseal junction of fifth metatarsal.  Gout, unspecified cause, unspecified chronicity, unspecified site  -Stable without recent flare, no med changes.  Plan on labs at follow-up in 3 months.  Meds ordered this encounter  Medications   metFORMIN (GLUCOPHAGE) 500 MG tablet    Sig: Take 1 tablet (500 mg total) by mouth daily with breakfast.    Dispense:  30 tablet    Refill:  3   Patient Instructions  If blood sugars remain elevated can start metformin once per day.  Recheck blood sugar with other lab work in 3 months. Glad to hear foot and knee are better but I will order some x-rays, especially based on location of pain in your foot.  If any concerns on his x-rays I will let you know.  No new medications or treatments for now as long as his areas continue to improve.  Let me know if that changes.   Healthy snacks and see other ways to manage stress below to see if that lessens stress/emotional eating. Return to the clinic or go to the nearest emergency room if any of your symptoms worsen or new symptoms occur.  Managing Stress, Adult Feeling a certain amount of stress is normal. Stress helps our body and mind get ready to deal with the demands of life. Stress hormones can motivate you to do well at work and meet your responsibilities. But severe or long-term (chronic) stress can affect your mental and physical health. Chronic stress puts you at higher risk for: Anxiety and depression. Other health problems such as digestive problems, muscle aches, heart disease, high blood  pressure, and stroke. What are the causes? Common causes of stress include: Demands from work, such as deadlines, feeling overworked, or having long hours. Pressures at home, such as money issues, disagreements with a spouse, or parenting issues. Pressures from major life changes, such as divorce, moving, loss of a loved one, or chronic illness. You may be at higher risk for stress-related problems if you: Do not get enough sleep. Are in poor health. Do not have emotional support. Have a mental health disorder such as anxiety or depression. How to recognize stress Stress can make you: Have trouble sleeping. Feel sad, anxious, irritable, or overwhelmed. Lose your appetite. Overeat or want to eat unhealthy foods. Want to use drugs or alcohol. Stress can also cause physical symptoms, such as: Sore, tense muscles, especially in the shoulders and neck. Headaches. Trouble breathing. A faster heart rate. Stomach pain, nausea, or vomiting. Diarrhea or constipation. Trouble concentrating. Follow these instructions at home: Eating and drinking Eat a healthy diet. This includes: Eating foods that are high in fiber, such as beans, whole grains, and fresh fruits and vegetables. Limiting  foods that are high in fat and processed sugars, such as fried or sweet foods. Do not skip meals or overeat. Drink enough fluid to keep your urine pale yellow. Alcohol use Do not drink alcohol if: Your health care provider tells you not to drink. You are pregnant, may be pregnant, or are planning to become pregnant. Drinking alcohol is a way some people try to ease their stress. This can be dangerous, so if you drink alcohol: Limit how much you have to: 0-1 drink a day for women. 0-2 drinks a day for men. Know how much alcohol is in your drink. In the [place] one drink equals one 12 oz bottle of beer (355 mL), one 5 oz glass of wine (148 mL), or one 1 oz glass of hard liquor (44 mL). Activity  Include 30  minutes of exercise in your daily schedule. Exercise is a good stress reducer. Include time in your day for an activity that you find relaxing. Try taking a walk, going on a bike ride, reading a book, or listening to music. Schedule your time in a way that lowers stress, and keep a regular schedule. Focus on doing what is most important to get done. Lifestyle Identify the source of your stress and your reaction to it. See a therapist who can help you change unhelpful reactions. When there are stressful events: Talk about them with family, friends, or coworkers. Try to think realistically about stressful events and not ignore them or overreact. Try to find the positives in a stressful situation and not focus on the negatives. Cut back on responsibilities at work and home, if possible. Ask for help from friends or family members if you need it. Find ways to manage stress, such as: Mindfulness, meditation, or deep breathing. Yoga or tai chi. Progressive muscle relaxation. Spending time in nature. Doing art, playing music, or reading. Making time for fun activities. Spending time with family and friends. Get support from family, friends, or spiritual resources. General instructions Get enough sleep. Try to go to sleep and get up at about the same time every day. Take over-the-counter and prescription medicines only as told by your health care provider. Do not use any products that contain nicotine or tobacco. These products include cigarettes, chewing tobacco, and vaping devices, such as e-cigarettes. If you need help quitting, ask your health care provider. Do not use drugs or smoke to deal with stress. Keep all follow-up visits. This is important. Where to find support Talk with your health care provider about stress management or finding a support group. Find a therapist to work with you on your stress management techniques. Where to find more information [employer] on Mental  Illness: www.nami.org American Psychological Association: TVStereos.ch Contact a health care provider if: Your stress symptoms get worse. You are unable to manage your stress at home. You are struggling to stop using drugs or alcohol. Get help right away if: You may be a danger to yourself or others. You have any thoughts of death or suicide. Get help right awayif you feel like you may hurt yourself or others, or have thoughts about taking your own life. Go to your nearest emergency room or: Call 911. Call the [facility] at 763-256-1831 or 988 in the [place] This is open 24 hours a day. Text the Crisis Text Line at (586)466-9338. Summary Feeling a certain amount of stress is normal, but severe or long-term (chronic) stress can affect your mental and physical health. Chronic stress  can put you at higher risk for anxiety, depression, and other health problems such as digestive problems, muscle aches, heart disease, high blood pressure, and stroke. You may be at higher risk for stress-related problems if you do not get enough sleep, are in poor health, lack emotional support, or have a mental health disorder such as anxiety or depression. Identify the source of your stress and your reaction to it. Try talking about stressful events with family, friends, or coworkers, finding a coping method, or getting support from spiritual resources. If you need more help, talk with your health care provider about finding a support group or a mental health therapist. This information is not intended to replace advice given to you by your health care provider. Make sure you discuss any questions you have with your health care provider. Document Revised: 03/13/2021 Document Reviewed: 03/11/2021 Elsevier Patient Education  [place].  Prediabetes Prediabetes is when your blood sugar (blood glucose) level is higher than normal but not high enough for you to be diagnosed with type 2  diabetes. Having prediabetes puts you at risk for developing type 2 diabetes (type 2 diabetes mellitus). With certain lifestyle changes, you may be able to prevent or delay the onset of type 2 diabetes. This is important because type 2 diabetes can lead to serious complications, such as: Heart disease. Stroke. Blindness. Kidney disease. Depression. Poor circulation in the feet and legs. In severe cases, this could lead to surgical removal of a leg (amputation). What are the causes? The exact cause of prediabetes is not known. It may result from insulin resistance. Insulin resistance develops when cells in the body do not respond properly to insulin that the body makes. This can cause excess glucose to build up in the blood. High blood glucose (hyperglycemia) can develop. What increases the risk? The following factors may make you more likely to develop this condition: You have a family member with type 2 diabetes. You are older than 45 years. You had a temporary form of diabetes during a pregnancy (gestational diabetes). You had polycystic ovary syndrome (PCOS). You are overweight or obese. You are inactive (sedentary). You have a history of heart disease, including problems with cholesterol levels, high levels of blood fats, or high blood pressure. What are the signs or symptoms? You may have no symptoms. If you do have symptoms, they may include: Increased hunger. Increased thirst. Increased urination. Vision changes, such as blurry vision. Tiredness (fatigue). How is this diagnosed? This condition can be diagnosed with blood tests. Your blood glucose may be checked with one or more of the following tests: A fasting blood glucose (FBG) test. You will not be allowed to eat (you will fast) for at least 8 hours before a blood sample is taken. An A1C blood test (hemoglobin A1C). This test provides information about blood glucose levels over the previous 2?3 months. An oral glucose  tolerance test (OGTT). This test measures your blood glucose at two points in time: After fasting. This is your baseline level. Two hours after you drink a beverage that contains glucose. You may be diagnosed with prediabetes if: Your FBG is 100?125 mg/dL (5.6-6.9 mmol/L). Your A1C level is 5.7?6.4% (39-46 mmol/mol). Your OGTT result is 140?199 mg/dL (7.8-11 mmol/L). These blood tests may be repeated to confirm your diagnosis. How is this treated? Treatment may include dietary and lifestyle changes to help lower your blood glucose and prevent type 2 diabetes from developing. In some cases, medicine may be prescribed to  help lower the risk of type 2 diabetes. Follow these instructions at home: Nutrition  Follow a healthy meal plan. This includes eating lean proteins, whole grains, legumes, fresh fruits and vegetables, low-fat dairy products, and healthy fats. Follow instructions from your health care provider about eating or drinking restrictions. Meet with a dietitian to create a healthy eating plan that is right for you. Lifestyle Do moderate-intensity exercise for at least 30 minutes a day on 5 or more days each week, or as told by your health care provider. A mix of activities may be best, such as: Brisk walking, swimming, biking, and weight lifting. Lose weight as told by your health care provider. Losing 5-7% of your body weight can reverse insulin resistance. Do not drink alcohol if: Your health care provider tells you not to drink. You are pregnant, may be pregnant, or are planning to become pregnant. If you drink alcohol: Limit how much you use to: 0-1 drink a day for women. 0-2 drinks a day for men. Be aware of how much alcohol is in your drink. In the [place] one drink equals one 12 oz bottle of beer (355 mL), one 5 oz glass of wine (148 mL), or one 1 oz glass of hard liquor (44 mL). General instructions Take over-the-counter and prescription medicines only as told by your  health care provider. You may be prescribed medicines that help lower the risk of type 2 diabetes. Do not use any products that contain nicotine or tobacco, such as cigarettes, e-cigarettes, and chewing tobacco. If you need help quitting, ask your health care provider. Keep all follow-up visits. This is important. Where to find more information American Diabetes Association: www.diabetes.org Academy of Nutrition and Dietetics: www.eatright.org American Heart Association: www.heart.org Contact a health care provider if: You have any of these symptoms: Increased hunger. Increased urination. Increased thirst. Fatigue. Vision changes, such as blurry vision. Get help right away if you: Have shortness of breath. Feel confused. Vomit or feel like you may vomit. Summary Prediabetes is when your blood sugar (blood glucose)level is higher than normal but not high enough for you to be diagnosed with type 2 diabetes. Having prediabetes puts you at risk for developing type 2 diabetes (type 2 diabetes mellitus). Make lifestyle changes such as eating a healthy diet and exercising regularly to help prevent diabetes. Lose weight as told by your health care provider. This information is not intended to replace advice given to you by your health care provider. Make sure you discuss any questions you have with your health care provider. Document Revised: 11/16/2019 Document Reviewed: 11/16/2019 Elsevier Patient Education  [place],   Merri Ray, MD [place], [facility] 03/26/22 9:14 AM

## 2022-03-31 ENCOUNTER — Ambulatory Visit (INDEPENDENT_AMBULATORY_CARE_PROVIDER_SITE_OTHER): Payer: BC Managed Care – PPO

## 2022-03-31 DIAGNOSIS — L501 Idiopathic urticaria: Secondary | ICD-10-CM

## 2022-04-06 DIAGNOSIS — G4733 Obstructive sleep apnea (adult) (pediatric): Secondary | ICD-10-CM | POA: Diagnosis not present

## 2022-04-07 DIAGNOSIS — G4733 Obstructive sleep apnea (adult) (pediatric): Secondary | ICD-10-CM | POA: Diagnosis not present

## 2022-04-21 ENCOUNTER — Encounter: Payer: Self-pay | Admitting: Family Medicine

## 2022-04-23 ENCOUNTER — Encounter: Payer: Self-pay | Admitting: Family Medicine

## 2022-04-23 ENCOUNTER — Ambulatory Visit: Payer: BC Managed Care – PPO | Admitting: Family Medicine

## 2022-04-23 VITALS — BP 128/84 | HR 87 | Temp 98.1°F | Resp 16 | Ht 73.0 in | Wt 241.2 lb

## 2022-04-23 DIAGNOSIS — S46212A Strain of muscle, fascia and tendon of other parts of biceps, left arm, initial encounter: Secondary | ICD-10-CM

## 2022-04-23 DIAGNOSIS — Z23 Encounter for immunization: Secondary | ICD-10-CM

## 2022-04-23 NOTE — Patient Instructions (Signed)
Unfortunately it does appear that you have ruptured or torn one of your bicep muscles.  Avoid resistance exercises with that arm for now until you are evaluated by orthopedics.  I did place a referral.  Let me know if there are questions in the meantime and take care. Proximal Biceps Tendon Tear  The proximal biceps tendon is a strong cord of tissue that connects the biceps muscle--which is the muscle on the front of the upper arm--to the shoulder blade. A proximal biceps tendon tear (rupture) can include a partial or complete tear of the tendon near where it connects to the bone of the shoulder. This injury can interfere with your ability to lift your arm in front of your body, stabilize your shoulder, bend your elbow, and turn your hand palm-up (supination). What are the causes? This condition happens when too much force is placed on the tendon. This excess force may be caused by: The elbow being suddenly straightened from a bent position because of an external force. This could happen, for example, while catching a heavy weight or being pulled when waterskiing. Wear and tear from physical activity. Breaking a fall with your hand. What increases the risk? The following factors may make you more likely to develop this condition: Playing contact sports. Doing activities or sports that involve throwing or overhead movements, such as racket sports, gymnastics, or baseball. Doing activities or sports that involve putting sudden force on the arm, such as weight lifting or waterskiing. Having a weakened tendon because of: Long-lasting (chronic) biceps tendinitis. Certain medical conditions, such as diabetes or rheumatoid arthritis. Repeated corticosteroid use. Repetitive overhead movements. What are the signs or symptoms? Symptoms of this condition may include: Sudden sharp pain in the front of the shoulder. Pain may get worse during certain movements, such as: Lifting or carrying  objects. Straightening the elbow. Throwing or using overhead movements. Inflammation or a feeling of unusual warmth on the front of the shoulder. Painful tightening (spasm) of the biceps muscle. A bulge on the inside of the upper arm when the elbow is bent. Bruising in the shoulder or upper arm. This may develop 24-48 hours after the tendon is injured. Limited range of motion of the shoulder and elbow. Weakness in the elbow and forearm when: Bending the elbow. Rotating the wrist. How is this diagnosed? This condition may be diagnosed based on: Your symptoms and medical history. A physical exam. Your health care provider may test the strength and range of motion of your shoulder and elbow. Imaging tests, such as: X-rays. MRI. Ultrasound. How is this treated? Treatment depends on the severity of the condition. Treatment may include: Medicines to help relieve pain and inflammation. Resting and icing the injured area. Avoiding certain activities that put stress on your shoulder. Physical therapy. Surgery to repair the tear. This may be needed if nonsurgical treatments do not improve your condition. One or more injections of medicines (corticosteroids) into your upper arm to help reduce inflammation. This treatment is rare. Follow these instructions at home: Managing pain, stiffness, and swelling     If directed, put ice on the injured area: Put ice in a plastic bag. Place a towel between your skin and the bag. Leave the ice on for 20 minutes, 2-3 times a day. If directed, apply heat to the affected area before you exercise or as often as told by your health care provider. Use the heat source that your health care provider recommends, such as a moist heat pack or  a heating pad. Place a towel between your skin and the heat source. Leave the heat on for 20-30 minutes. Remove the heat if your skin turns bright red. This is especially important if you are unable to feel pain, heat, or  cold. You may have a greater risk of getting burned. Move your fingers often to avoid stiffness and to lessen swelling. Raise (elevate) the injured area while you are sitting or lying down. Activity Return to your normal activities as told by your health care provider. Ask your health care provider what activities are safe for you. Avoid activities that cause pain or make your condition worse. Do not lift anything that is heavier than 10 lb (4.5 kg), or the limit that you are told, until your health care provider says that it is safe. Do exercises as told by your physical therapist or health care provider. General instructions Take over-the-counter and prescription medicines only as told by your health care provider. Do not use any products that contain nicotine or tobacco, such as cigarettes and e-cigarettes. If you need help quitting, ask your health care provider. Keep all follow-up visits as told by your health care provider. This is important. How is this prevented? Take these steps to help prevent reinjury: Warm up and stretch before being active. Cool down and stretch after being active. Give your body time to rest between periods of activity. Make sure you use equipment that fits you. Be safe and responsible while being active. This will help you avoid falls. Maintain physical fitness, including strength and flexibility. Contact a health care provider if: You have symptoms that get worse or do not get better after 2 weeks of treatment. You develop new symptoms. Get help right away if: You have severe pain. You develop pain or numbness in your hand. Your hand feels unusually cold. Your fingernails turn a dark color, such as blue or gray. Summary A proximal biceps tendon tear can include a partial or complete tear of the tendon near where it connects to the bone of the shoulder. This condition happens when too much force is placed on the tendon. Treatment may include resting and  icing the injured area. Avoid activities that cause pain or make your condition worse. This information is not intended to replace advice given to you by your health care provider. Make sure you discuss any questions you have with your health care provider. Document Revised: 10/22/2021 Document Reviewed: 10/09/2020 Elsevier Patient Education  [place].

## 2022-04-23 NOTE — Progress Notes (Signed)
Subjective:  Patient ID: Juan Nelson, male    DOB: [birth year]  Age: 56 y.o. MRN: [id number]  CC:  Chief Complaint  Patient presents with   Muscle Pain    Pt reports weight training a few days ago and notes pop sound from Lt upper arm Tuesday last week and has been sore since, causing some hesitation when lifting but not preventing him from doing so, some pain in shoulder and into lower arm     HPI Juan Nelson presents for   L arm pain: Lifting weights 9 days ago felt pop in upper left arm. Bicep curls, as flexing - felt pop in top of biceps.  No swelling, bruising, or deformity noted. Persistent soreness in upper bicep, occasionally radiates to shoulder.  When reaching, lifting, sore in bicep area.  Tx; biofreeze once - helped numb,    Left hand dominant. No prior L shoulder/elbow surgery or injury.    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   Primary hypertension 12/25/2020   GERD with apnea 12/25/2020   Pain in right knee 04/27/2018   Other seasonal allergic rhinitis 03/17/2017   Hx of laparoscopic adjustable gastric banding 02/08/2017   History of kidney stones 04/18/2014   Essential hypertension 04/18/2014   LFT elevation 10/08/2012   Hyperglycemia 10/08/2012   Past Medical History:  Diagnosis Date   Allergy    Arthritis    right knee   GERD (gastroesophageal reflux disease)    History of kidney stones    multople stones    Hypertension    Kidney stone    Sleep apnea    Urticaria    Past Surgical History:  Procedure Laterality Date   EXTRACORPOREAL SHOCK WAVE LITHOTRIPSY Right 11/05/2016   Procedure: RIGHT EXTRACORPOREAL SHOCK WAVE LITHOTRIPSY (ESWL);  Surgeon: Cleon Gustin, MD;  Location: [facility];  Service: Urology;  Laterality: Right;   GANGLION CYST EXCISION  1989   LAPAROSCOPIC GASTRIC BANDING     NEVUS EXCISION Right 04/28/2017   SPINE SURGERY      Allergies  Allergen Reactions   Pumpkin Seed Hives and Swelling    Swelling of throat, tongue and lips. Swelling of throat, tongue and lips. Swelling of throat, tongue and lips.   Sesame Seed (Diagnostic) Anaphylaxis   Bee Venom Swelling   Insect Extract Hives, Itching and Swelling   Mobic [Meloxicam] Nausea Only    "just didn't feel well"   Prior to Admission medications   Medication Sig Start Date End Date Taking? Authorizing Provider  acetaminophen (TYLENOL) 500 MG tablet Take 500 mg by mouth every 6 (six) hours as needed for headache.   Yes [provider]  allopurinol (ZYLOPRIM) 100 MG tablet TAKE 1 TABLET BY MOUTH EVERY DAY 12/02/21  Yes Wendie Agreste, MD  aspirin EC 81 MG tablet Take 81 mg by mouth daily.   Yes [provider]  calcium citrate (CALCITRATE - DOSED IN MG ELEMENTAL CALCIUM) 950 (200 Ca) MG tablet Take by mouth daily.   Yes [provider]  cholecalciferol (VITAMIN D) 1000 UNITS tablet Take 5,000 Units by mouth daily.   Yes [provider]  colchicine 0.6 MG tablet Take two tabs once at the first sign of a gout flare and then one an hour later. Repeat in 24 hours if needed. Patient taking differently: Take 0.12 mg by mouth daily as needed (gout).  Take two tabs once at the first sign of a gout flare and then one an hour later. Repeat in 24 hours if needed. 08/16/17  Yes Tereasa Coop, PA-C  cyclobenzaprine (FLEXERIL) 5 MG tablet Take 5 mg by mouth 3 (three) times daily as needed for muscle spasms.   Yes [provider]  diclofenac (VOLTAREN) 75 MG EC tablet Take 1 tablet (75 mg total) by mouth 2 (two) times daily. As needed 01/29/21  Yes Wendie Agreste, MD  EPINEPHrine (AUVI-Q) 0.3 mg/0.3 mL IJ SOAJ injection Inject 0.3 mg into the muscle as needed for anaphylaxis. 10/21/21  Yes Valentina Shaggy, MD  fluticasone [facility]) 50 MCG/ACT nasal spray Place 2 sprays into both nostrils daily. 08/15/21  Yes Hawks, Alyse Low A,  FNP  lisinopril (ZESTRIL) 40 MG tablet TAKE 1 TABLET BY MOUTH EVERY DAY 12/02/21  Yes Wendie Agreste, MD  loratadine (ALLERGY) 10 MG tablet Take 10 mg by mouth daily. Alessandra Bevels   Yes [provider]  Magnesium 100 MG CAPS Take by mouth.   Yes [provider]  metFORMIN (GLUCOPHAGE) 500 MG tablet Take 1 tablet (500 mg total) by mouth daily with breakfast. 03/26/22  Yes Wendie Agreste, MD  Multiple Vitamin (MULTIVITAMIN WITH MINERALS) TABS Take 1 tablet by mouth daily.   Yes [provider]  omalizumab Arvid Right) 150 MG/ML prefilled syringe Inject 300 mg into the skin every 28 (twenty-eight) days. 08/05/21  Yes Valentina Shaggy, MD  tadalafil (CIALIS) 20 MG tablet Take 20 mg by mouth daily as needed for erectile dysfunction.  10/27/19  Yes [provider]  tamsulosin (FLOMAX) 0.4 MG CAPS capsule TAKE 1 CAP DAILY AS NEEDED FOR SYMPTOMS OF KIDNEY STONE 02/23/22  Yes Wendie Agreste, MD  Turmeric (QC TUMERIC COMPLEX PO) Take by mouth.   Yes [provider]   Social History   Socioeconomic History   Marital status: Single    Spouse name: Not on file   Number of children: Not on file   Years of education: Not on file   Highest education level: Doctorate  Occupational History   Not on file  Tobacco Use   Smoking status: Never   Smokeless tobacco: Never  Vaping Use   Vaping Use: Never used  Substance and Sexual Activity   Alcohol use: No   Drug use: No   Sexual activity: Yes  Other Topics Concern   Not on file  Social History Narrative   Lives alone   Caffeine- varies 0-1   Social Determinants of Health   Financial Resource Strain: Not on file  Food Insecurity: Not on file  Transportation Needs: Not on file  Physical Activity: Not on file  Stress: Not on file  Social Connections: Not on file  Intimate Partner Violence: Not on file    Review of Systems Per HPI.   Objective:   Vitals:   04/23/22 1402  BP: 128/84  Pulse: 87   Resp: 16  Temp: 98.1 F (36.7 C)  TempSrc: Oral  SpO2: 97%  Weight: 241 lb 3.2 oz (109.4 kg)  Height: 6' 1"  (1.854 m)     Physical Exam Vitals reviewed.  Constitutional:      General: He is not in acute distress.    Appearance: Normal appearance. He is well-developed.  HENT:     Head: Normocephalic and atraumatic.  Cardiovascular:     Rate and Rhythm: Normal rate.  Pulmonary:     Effort: Pulmonary effort is normal.  Musculoskeletal:     Comments: C-spine no midline bony tenderness, pain-free range of motion, does not reproduce shoulder or arm pain  Left shoulder, pain-free range of motion, full RTC strength, [place], AC, clavicle nontender, skin intact without erythema, ecchymosis, or apparent shoulder swelling.   Humerus nontender.  Asymmetric biceps area on left versus right.  Discomfort over the proximal bicep with asymmetry, step-off/defect upper biceps medial.  Tender in this area.  Distal biceps, tendon nontender.  Bicipital groove of shoulder nontender.  Discomfort with light resistance of elbow flexion.  Triceps nontender, no asymmetry.  Neurological:     Mental Status: He is alert and oriented to person, place, and time.  Psychiatric:        Mood and Affect: Mood normal.        Assessment & Plan:  Juan Nelson is a 56 y.o. male . Biceps rupture, proximal, left, initial encounter - Plan: Ambulatory referral to Orthopedic Surgery  Need for influenza vaccination - Plan: Flu Vaccine QUAD 6+ mos PF IM (Fluarix Quad PF)  Proximal biceps rupture.  Refer to orthopedics for definitive care and discussion of treatment/repair.  Avoidance of weight lifting with that arm for now.  Routine activities okay.  No orders of the defined types were placed in this encounter.  Patient Instructions  Unfortunately it does appear that you have ruptured or torn one of your bicep muscles.  Avoid resistance exercises with that arm for now until you are evaluated by orthopedics.  I  did place a referral.  Let me know if there are questions in the meantime and take care. Proximal Biceps Tendon Tear  The proximal biceps tendon is a strong cord of tissue that connects the biceps muscle--which is the muscle on the front of the upper arm--to the shoulder blade. A proximal biceps tendon tear (rupture) can include a partial or complete tear of the tendon near where it connects to the bone of the shoulder. This injury can interfere with your ability to lift your arm in front of your body, stabilize your shoulder, bend your elbow, and turn your hand palm-up (supination). What are the causes? This condition happens when too much force is placed on the tendon. This excess force may be caused by: The elbow being suddenly straightened from a bent position because of an external force. This could happen, for example, while catching a heavy weight or being pulled when waterskiing. Wear and tear from physical activity. Breaking a fall with your hand. What increases the risk? The following factors may make you more likely to develop this condition: Playing contact sports. Doing activities or sports that involve throwing or overhead movements, such as racket sports, gymnastics, or baseball. Doing activities or sports that involve putting sudden force on the arm, such as weight lifting or waterskiing. Having a weakened tendon because of: Long-lasting (chronic) biceps tendinitis. Certain medical conditions, such as diabetes or rheumatoid arthritis. Repeated corticosteroid use. Repetitive overhead movements. What are the signs or symptoms? Symptoms of this condition may include: Sudden sharp pain in the front of the shoulder. Pain may get worse during certain movements, such as: Lifting or carrying objects. Straightening the elbow. Throwing or using overhead movements. Inflammation or a feeling of unusual warmth on the front of the shoulder. Painful tightening (spasm) of the biceps  muscle. A bulge on the inside of the upper arm when the elbow is bent. Bruising in the shoulder or upper arm. This may develop 24-48 hours after the tendon is  injured. Limited range of motion of the shoulder and elbow. Weakness in the elbow and forearm when: Bending the elbow. Rotating the wrist. How is this diagnosed? This condition may be diagnosed based on: Your symptoms and medical history. A physical exam. Your health care provider may test the strength and range of motion of your shoulder and elbow. Imaging tests, such as: X-rays. MRI. Ultrasound. How is this treated? Treatment depends on the severity of the condition. Treatment may include: Medicines to help relieve pain and inflammation. Resting and icing the injured area. Avoiding certain activities that put stress on your shoulder. Physical therapy. Surgery to repair the tear. This may be needed if nonsurgical treatments do not improve your condition. One or more injections of medicines (corticosteroids) into your upper arm to help reduce inflammation. This treatment is rare. Follow these instructions at home: Managing pain, stiffness, and swelling     If directed, put ice on the injured area: Put ice in a plastic bag. Place a towel between your skin and the bag. Leave the ice on for 20 minutes, 2-3 times a day. If directed, apply heat to the affected area before you exercise or as often as told by your health care provider. Use the heat source that your health care provider recommends, such as a moist heat pack or a heating pad. Place a towel between your skin and the heat source. Leave the heat on for 20-30 minutes. Remove the heat if your skin turns bright red. This is especially important if you are unable to feel pain, heat, or cold. You may have a greater risk of getting burned. Move your fingers often to avoid stiffness and to lessen swelling. Raise (elevate) the injured area while you are sitting or lying  down. Activity Return to your normal activities as told by your health care provider. Ask your health care provider what activities are safe for you. Avoid activities that cause pain or make your condition worse. Do not lift anything that is heavier than 10 lb (4.5 kg), or the limit that you are told, until your health care provider says that it is safe. Do exercises as told by your physical therapist or health care provider. General instructions Take over-the-counter and prescription medicines only as told by your health care provider. Do not use any products that contain nicotine or tobacco, such as cigarettes and e-cigarettes. If you need help quitting, ask your health care provider. Keep all follow-up visits as told by your health care provider. This is important. How is this prevented? Take these steps to help prevent reinjury: Warm up and stretch before being active. Cool down and stretch after being active. Give your body time to rest between periods of activity. Make sure you use equipment that fits you. Be safe and responsible while being active. This will help you avoid falls. Maintain physical fitness, including strength and flexibility. Contact a health care provider if: You have symptoms that get worse or do not get better after 2 weeks of treatment. You develop new symptoms. Get help right away if: You have severe pain. You develop pain or numbness in your hand. Your hand feels unusually cold. Your fingernails turn a dark color, such as blue or gray. Summary A proximal biceps tendon tear can include a partial or complete tear of the tendon near where it connects to the bone of the shoulder. This condition happens when too much force is placed on the tendon. Treatment may include resting and icing the injured  area. Avoid activities that cause pain or make your condition worse. This information is not intended to replace advice given to you by your health care provider.  Make sure you discuss any questions you have with your health care provider. Document Revised: 10/22/2021 Document Reviewed: 10/09/2020 Elsevier Patient Education  [place],   Merri Ray, MD [place] 04/23/22 3:05 PM

## 2022-05-01 DIAGNOSIS — H903 Sensorineural hearing loss, bilateral: Secondary | ICD-10-CM | POA: Diagnosis not present

## 2022-05-01 DIAGNOSIS — M25512 Pain in left shoulder: Secondary | ICD-10-CM | POA: Diagnosis not present

## 2022-05-01 DIAGNOSIS — H6982 Other specified disorders of Eustachian tube, left ear: Secondary | ICD-10-CM | POA: Diagnosis not present

## 2022-05-01 DIAGNOSIS — S46112A Strain of muscle, fascia and tendon of long head of biceps, left arm, initial encounter: Secondary | ICD-10-CM | POA: Diagnosis not present

## 2022-05-01 DIAGNOSIS — H6121 Impacted cerumen, right ear: Secondary | ICD-10-CM | POA: Diagnosis not present

## 2022-05-02 ENCOUNTER — Other Ambulatory Visit: Payer: Self-pay | Admitting: Family

## 2022-05-02 DIAGNOSIS — J069 Acute upper respiratory infection, unspecified: Secondary | ICD-10-CM

## 2022-05-05 DIAGNOSIS — G4733 Obstructive sleep apnea (adult) (pediatric): Secondary | ICD-10-CM | POA: Diagnosis not present

## 2022-05-07 DIAGNOSIS — G4733 Obstructive sleep apnea (adult) (pediatric): Secondary | ICD-10-CM | POA: Diagnosis not present

## 2022-05-10 DIAGNOSIS — M25512 Pain in left shoulder: Secondary | ICD-10-CM | POA: Diagnosis not present

## 2022-05-12 ENCOUNTER — Ambulatory Visit: Payer: BC Managed Care – PPO | Admitting: *Deleted

## 2022-05-12 DIAGNOSIS — L501 Idiopathic urticaria: Secondary | ICD-10-CM | POA: Diagnosis not present

## 2022-05-19 ENCOUNTER — Other Ambulatory Visit: Payer: Self-pay | Admitting: Family Medicine

## 2022-05-19 DIAGNOSIS — I10 Essential (primary) hypertension: Secondary | ICD-10-CM

## 2022-05-19 DIAGNOSIS — Z8739 Personal history of other diseases of the musculoskeletal system and connective tissue: Secondary | ICD-10-CM

## 2022-05-20 ENCOUNTER — Encounter: Payer: Self-pay | Admitting: Family Medicine

## 2022-05-20 DIAGNOSIS — M7542 Impingement syndrome of left shoulder: Secondary | ICD-10-CM | POA: Diagnosis not present

## 2022-05-20 DIAGNOSIS — M25512 Pain in left shoulder: Secondary | ICD-10-CM | POA: Diagnosis not present

## 2022-05-21 DIAGNOSIS — M75102 Unspecified rotator cuff tear or rupture of left shoulder, not specified as traumatic: Secondary | ICD-10-CM | POA: Insufficient documentation

## 2022-05-22 NOTE — Telephone Encounter (Signed)
I do not see a current MRI I do see previous knee XR but pt is questioning about shoulder surgery? Checked Care Everywhere and theres a note recommending MRI but no imaging available please advise

## 2022-05-26 DIAGNOSIS — D2261 Melanocytic nevi of right upper limb, including shoulder: Secondary | ICD-10-CM | POA: Diagnosis not present

## 2022-05-26 DIAGNOSIS — D225 Melanocytic nevi of trunk: Secondary | ICD-10-CM | POA: Diagnosis not present

## 2022-05-26 DIAGNOSIS — D485 Neoplasm of uncertain behavior of skin: Secondary | ICD-10-CM | POA: Diagnosis not present

## 2022-05-26 DIAGNOSIS — L821 Other seborrheic keratosis: Secondary | ICD-10-CM | POA: Diagnosis not present

## 2022-05-26 DIAGNOSIS — D2262 Melanocytic nevi of left upper limb, including shoulder: Secondary | ICD-10-CM | POA: Diagnosis not present

## 2022-06-01 NOTE — Progress Notes (Unsigned)
PATIENT: Juan Nelson DOB: 26-Nov-1965  REASON FOR VISIT: follow up HISTORY FROM: patient  No chief complaint on file.    HISTORY OF PRESENT ILLNESS:  06/01/22 ALL:  Juan Nelson returns for follow up for OSA on CPAP.   06/03/2021 ALL: Juan Nelson is a 56 y.o. male here today for follow up for OSA on CPAP.  HST 01/28/2021 showed OSA with AHI 19.8/hr. No hypoxemia. New CPAP machine was ordered. He is adjusting well. He is using a nasal mask. No concerns with machine or supplies.   Compliance report dated 05/05/2021-06/03/2021 shows that he used CPAP 29/30 days for greater than 4 hours for compliance of 97%. Average usage was 8 hours. Residual AHI was 0.5/hr on 6-16cmH20. No leak noted.   HISTORY: (copied from Dr Dohmeier's previous note)  Juan Nelson is a 3- year -old Caucasian male patient seen here as a referral on 12/25/2020 from Dr Carlota Raspberry for a transfer of sleep apnea care.   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 He had back surgery in 2014, was seen by Nolberto Hanlon, MD    The patient had the first sleep study in the year 2004-  with a result of an AHI ( Apnea Hypopnea index)  of 28/h, the patient had some periodic limb movements but this did not lead to arousals.  He had a normal cardiac rhythm according to Dr. Baird Lyons at the time he was referred by Dr. Bud Face.  This based on PSG was followed by a CPAP titration to 14 cmH2O dated 12-05-2002 the patient had snoring that was positional dependent.  He did not have central  apneas only in rem sleep where there a few apneas remaining while on CPAP.  The AHI was 0.0 at 14 cm water pressure.   Sleep relevant medical history: one nocturia on CPAP- no snoring on CPAP.    Family medical /sleep history: his father with OSA.    Social history:  Patient is working as Manufacturing engineer an Pharmacist, hospital - choir- church related.  and lives in a household [place]. 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   Tobacco use: none .  ETOH use none ,Caffeine intake in form of Coffee(/) Soda( daily) Tea ( daily*) or energy drinks. Regular exercise in form of walking.    Sleep habits are as follows: The patient's dinner time is between 6-8.30 PM. The patient goes to bed at 11-12 PM and continues to sleep for interval of 4 hours, wakes for one bathroom break.   The preferred sleep position is right and supine , with the support of 2 pillows with one additional body pillow- Dreams are reportedly rare. 8.30 AM is the usual rise time. The patient wakes up spontaneously as late as 8AM.  He reports not feeling refreshed or restored in AM, quality has ben declining-  Naps are taken infrequently, lasting from 20-30  minutes and are more refreshing than nocturnal sleep.    REVIEW OF SYSTEMS: Out of  a complete 14 system review of symptoms, the patient complains only of the following symptoms, none and all other reviewed systems are negative.  ESS: previously 5/24, now 2/24  ALLERGIES: Allergies  Allergen Reactions   Pumpkin Seed Hives and Swelling    Swelling of throat, tongue and lips. Swelling of throat, tongue and lips. Swelling of throat, tongue and lips.   Sesame Seed (Diagnostic) Anaphylaxis   Bee Venom Swelling   Insect Extract Hives, Itching and Swelling   Mobic [Meloxicam] Nausea Only    "just didn't feel well"    HOME MEDICATIONS: Outpatient Medications Prior to Visit  Medication Sig Dispense Refill   acetaminophen  (TYLENOL) 500 MG tablet Take 500 mg by mouth every 6 (six) hours as needed for headache.     allopurinol (ZYLOPRIM) 100 MG tablet TAKE 1 TABLET BY MOUTH EVERY DAY 30 tablet 5   aspirin EC 81 MG tablet Take 81 mg by mouth daily.     calcium citrate (CALCITRATE - DOSED IN MG ELEMENTAL CALCIUM) 950 (200 Ca) MG tablet Take by mouth daily.     cholecalciferol (VITAMIN D) 1000 UNITS tablet Take 5,000 Units by mouth daily.     colchicine 0.6 MG tablet Take two tabs once at the first sign of a gout flare and then one an hour later. Repeat in 24 hours if needed. (Patient taking differently: Take 0.12 mg by mouth daily as needed (gout). Take two tabs once at the first sign of a gout flare and then one an hour later. Repeat in 24 hours if needed.) 12 tablet 1   cyclobenzaprine (FLEXERIL) 5 MG tablet Take 5 mg by mouth 3 (three) times daily as needed for muscle spasms.     diclofenac (VOLTAREN) 75 MG EC tablet Take 1 tablet (75 mg total) by mouth 2 (two) times daily. As needed 180 tablet 0   EPINEPHrine (AUVI-Q) 0.3 mg/0.3 mL IJ SOAJ injection Inject 0.3 mg into the muscle as needed for anaphylaxis. 2 each 2   fluticasone (FLONASE) 50 MCG/ACT nasal spray Place 2 sprays into both nostrils daily. 16 g 6   lisinopril (ZESTRIL) 40 MG tablet TAKE 1 TABLET BY MOUTH EVERY DAY 30 tablet 5   loratadine (ALLERGY) 10 MG tablet Take 10 mg by mouth daily. Alegra D     Magnesium 100 MG CAPS Take by mouth.     metFORMIN (GLUCOPHAGE) 500 MG tablet Take 1 tablet (500 mg total) by mouth daily with breakfast. 30 tablet 3   Multiple Vitamin (MULTIVITAMIN WITH MINERALS) TABS Take 1 tablet by mouth daily.     omalizumab Arvid Right) 150 MG/ML prefilled syringe Inject 300 mg into the skin every 28 (twenty-eight) days. 2 mL 11   tadalafil (CIALIS) 20 MG tablet Take 20 mg by mouth daily as needed for erectile dysfunction.      tamsulosin (FLOMAX) 0.4 MG CAPS capsule TAKE 1 CAP DAILY AS NEEDED FOR SYMPTOMS OF KIDNEY STONE 30 capsule 3    Turmeric (QC TUMERIC COMPLEX PO) Take by mouth.     Facility-Administered Medications Prior to Visit  Medication Dose Route Frequency Provider Last Rate Last Admin   omalizumab Arvid Right) injection 300 mg  300 mg Subcutaneous Q28 days Kennith Gain, MD   300 mg at 05/12/22 1012    PAST MEDICAL HISTORY: Past Medical History:  Diagnosis Date   Allergy    Arthritis    right knee   GERD (gastroesophageal reflux disease)    History of kidney stones  multople stones    Hypertension    Kidney stone    Sleep apnea    Urticaria     PAST SURGICAL HISTORY: Past Surgical History:  Procedure Laterality Date   EXTRACORPOREAL SHOCK WAVE LITHOTRIPSY Right 11/05/2016   Procedure: RIGHT EXTRACORPOREAL SHOCK WAVE LITHOTRIPSY (ESWL);  Surgeon: Cleon Gustin, MD;  Location: [facility];  Service: Urology;  Laterality: Right;   GANGLION CYST EXCISION  1989   LAPAROSCOPIC GASTRIC BANDING     NEVUS EXCISION Right 04/28/2017   SPINE SURGERY      FAMILY HISTORY: Family History  Problem Relation Age of Onset   Hypertension Mother    Diabetes Father    Cancer Maternal Grandmother    Depression Maternal Grandfather    Alzheimer's disease Paternal Grandmother    Cancer Paternal Grandfather     SOCIAL HISTORY: Social History   Socioeconomic History   Marital status: Single    Spouse name: Not on file   Number of children: Not on file   Years of education: Not on file   Highest education level: Doctorate  Occupational History   Not on file  Tobacco Use   Smoking status: Never   Smokeless tobacco: Never  Vaping Use   Vaping Use: Never used  Substance and Sexual Activity   Alcohol use: No   Drug use: No   Sexual activity: Yes  Other Topics Concern   Not on file  Social History Narrative   Lives alone   Caffeine- varies 0-1   Social Determinants of Health   Financial Resource Strain: Not on file  Food Insecurity: Not on file  Transportation Needs: Not on file   Physical Activity: Not on file  Stress: Not on file  Social Connections: Not on file  Intimate Partner Violence: Not on file     PHYSICAL EXAM  There were no vitals filed for this visit.  There is no height or weight on file to calculate BMI.  Generalized: Well developed, in no acute distress  Cardiology: normal rate and rhythm, no murmur noted Respiratory: clear to auscultation bilaterally  Neurological examination  Mentation: Alert oriented to time, place, history taking. Follows all commands speech and language fluent Cranial nerve II-XII: Pupils were equal round reactive to light. Extraocular movements were full, visual field were full  Motor: The motor testing reveals 5 over 5 strength of all 4 extremities. Good symmetric motor tone is noted throughout.  Gait and station: Gait is normal.    DIAGNOSTIC DATA (LABS, IMAGING, TESTING) - I reviewed patient records, labs, notes, testing and imaging myself where available.      No data to display           Lab Results  Component Value Date   WBC 8.3 05/18/2020   HGB 13.3 05/18/2020   HCT 38.0 (L) 05/18/2020   MCV 89.4 05/18/2020   PLT 158 05/18/2020      Component Value Date/Time   NA 137 08/01/2021 1248   NA 140 07/05/2020 1619   K 3.9 08/01/2021 1248   CL 104 08/01/2021 1248   CO2 25 08/01/2021 1248   GLUCOSE 101 (H) 08/01/2021 1248   BUN 12 08/01/2021 1248   BUN 12 07/05/2020 1619   CREATININE 0.97 08/01/2021 1248   CREATININE 1.05 01/24/2016 1342   CALCIUM 9.3 08/01/2021 1248   PROT 6.9 08/01/2021 1248   PROT 6.5 07/05/2020 1619   ALBUMIN 4.5 08/01/2021 1248   ALBUMIN 4.5 07/05/2020 1619   AST 25 08/01/2021  1248   ALT 31 08/01/2021 1248   ALKPHOS 82 08/01/2021 1248   BILITOT 0.9 08/01/2021 1248   BILITOT 0.4 07/05/2020 1619   GFRNONAA 92 07/05/2020 1619   GFRNONAA 82 01/24/2016 1342   GFRAA 106 07/05/2020 1619   GFRAA >89 01/24/2016 1342   Lab Results  Component Value Date   CHOL 132  07/05/2019   HDL 31 (L) 07/05/2019   LDLCALC 76 07/05/2019   TRIG 144 07/05/2019   CHOLHDL 4.3 07/05/2019   Lab Results  Component Value Date   HGBA1C 5.9 (A) 03/26/2022   Lab Results  Component Value Date   VITAMINB12 >2000 (H) 06/27/2018   Lab Results  Component Value Date   TSH 1.470 07/05/2019     ASSESSMENT AND PLAN 56 y.o. year old male  has a past medical history of Allergy, Arthritis, GERD (gastroesophageal reflux disease), History of kidney stones, Hypertension, Kidney stone, Sleep apnea, and Urticaria. here with   No diagnosis found.     Juan Nelson is doing well on CPAP therapy. Compliance report reveals excellent compliance. He was encouraged to continue using CPAP nightly and for greater than 4 hours each night. Risks of untreated sleep apnea review and education materials provided. Healthy lifestyle habits encouraged. He will follow up in 1 year, sooner if needed. He verbalizes understanding and agreement with this plan.    No orders of the defined types were placed in this encounter.     No orders of the defined types were placed in this encounter.      Debbora Presto, FNP-C 06/01/2022, 8:47 AM [facility] [address], [place], [place] [address] [phone]

## 2022-06-01 NOTE — Patient Instructions (Signed)

## 2022-06-02 ENCOUNTER — Telehealth: Payer: Self-pay

## 2022-06-02 NOTE — Telephone Encounter (Signed)
Called pt to bring CPAP machine with power cord to his appt.

## 2022-06-03 ENCOUNTER — Encounter: Payer: Self-pay | Admitting: Family Medicine

## 2022-06-03 ENCOUNTER — Ambulatory Visit: Payer: BC Managed Care – PPO | Admitting: Family Medicine

## 2022-06-03 VITALS — BP 141/91 | HR 68 | Ht 73.0 in | Wt 235.0 lb

## 2022-06-03 DIAGNOSIS — G4733 Obstructive sleep apnea (adult) (pediatric): Secondary | ICD-10-CM

## 2022-06-04 ENCOUNTER — Telehealth: Payer: Self-pay

## 2022-06-04 DIAGNOSIS — G4733 Obstructive sleep apnea (adult) (pediatric): Secondary | ICD-10-CM | POA: Diagnosis not present

## 2022-06-05 ENCOUNTER — Other Ambulatory Visit: Payer: Self-pay | Admitting: Allergy & Immunology

## 2022-06-06 DIAGNOSIS — G4733 Obstructive sleep apnea (adult) (pediatric): Secondary | ICD-10-CM | POA: Diagnosis not present

## 2022-06-09 DIAGNOSIS — M75122 Complete rotator cuff tear or rupture of left shoulder, not specified as traumatic: Secondary | ICD-10-CM | POA: Diagnosis not present

## 2022-06-09 DIAGNOSIS — M25512 Pain in left shoulder: Secondary | ICD-10-CM | POA: Diagnosis not present

## 2022-06-09 DIAGNOSIS — M7542 Impingement syndrome of left shoulder: Secondary | ICD-10-CM | POA: Diagnosis not present

## 2022-06-09 DIAGNOSIS — X58XXXA Exposure to other specified factors, initial encounter: Secondary | ICD-10-CM | POA: Diagnosis not present

## 2022-06-09 DIAGNOSIS — M19012 Primary osteoarthritis, left shoulder: Secondary | ICD-10-CM | POA: Diagnosis not present

## 2022-06-09 DIAGNOSIS — S46012A Strain of muscle(s) and tendon(s) of the rotator cuff of left shoulder, initial encounter: Secondary | ICD-10-CM | POA: Diagnosis not present

## 2022-06-09 DIAGNOSIS — S43432A Superior glenoid labrum lesion of left shoulder, initial encounter: Secondary | ICD-10-CM | POA: Diagnosis not present

## 2022-06-09 DIAGNOSIS — Z4789 Encounter for other orthopedic aftercare: Secondary | ICD-10-CM | POA: Diagnosis not present

## 2022-06-09 DIAGNOSIS — G8918 Other acute postprocedural pain: Secondary | ICD-10-CM | POA: Diagnosis not present

## 2022-06-09 DIAGNOSIS — Y999 Unspecified external cause status: Secondary | ICD-10-CM | POA: Diagnosis not present

## 2022-06-09 DIAGNOSIS — I890 Lymphedema, not elsewhere classified: Secondary | ICD-10-CM | POA: Diagnosis not present

## 2022-06-19 DIAGNOSIS — M25512 Pain in left shoulder: Secondary | ICD-10-CM | POA: Diagnosis not present

## 2022-06-20 DIAGNOSIS — Z4789 Encounter for other orthopedic aftercare: Secondary | ICD-10-CM | POA: Diagnosis not present

## 2022-06-20 DIAGNOSIS — M25512 Pain in left shoulder: Secondary | ICD-10-CM | POA: Diagnosis not present

## 2022-06-20 DIAGNOSIS — I890 Lymphedema, not elsewhere classified: Secondary | ICD-10-CM | POA: Diagnosis not present

## 2022-06-20 DIAGNOSIS — M19012 Primary osteoarthritis, left shoulder: Secondary | ICD-10-CM | POA: Diagnosis not present

## 2022-06-23 ENCOUNTER — Ambulatory Visit (INDEPENDENT_AMBULATORY_CARE_PROVIDER_SITE_OTHER): Payer: BC Managed Care – PPO | Admitting: *Deleted

## 2022-06-23 DIAGNOSIS — L501 Idiopathic urticaria: Secondary | ICD-10-CM | POA: Diagnosis not present

## 2022-06-24 DIAGNOSIS — M25512 Pain in left shoulder: Secondary | ICD-10-CM | POA: Diagnosis not present

## 2022-06-26 ENCOUNTER — Encounter: Payer: Self-pay | Admitting: Family Medicine

## 2022-06-26 ENCOUNTER — Ambulatory Visit: Payer: BC Managed Care – PPO | Admitting: Family Medicine

## 2022-06-26 DIAGNOSIS — I10 Essential (primary) hypertension: Secondary | ICD-10-CM

## 2022-06-26 DIAGNOSIS — R7303 Prediabetes: Secondary | ICD-10-CM | POA: Diagnosis not present

## 2022-06-26 DIAGNOSIS — Z8739 Personal history of other diseases of the musculoskeletal system and connective tissue: Secondary | ICD-10-CM

## 2022-06-26 LAB — COMPREHENSIVE METABOLIC PANEL
ALT: 25 U/L (ref 0–53)
AST: 19 U/L (ref 0–37)
Albumin: 4.3 g/dL (ref 3.5–5.2)
Alkaline Phosphatase: 94 U/L (ref 39–117)
BUN: 10 mg/dL (ref 6–23)
CO2: 28 mEq/L (ref 19–32)
Calcium: 9.2 mg/dL (ref 8.4–10.5)
Chloride: 105 mEq/L (ref 96–112)
Creatinine, Ser: 0.91 mg/dL (ref 0.40–1.50)
GFR: 93.34 mL/min (ref >60.00)
Glucose, Bld: 104 mg/dL — ABNORMAL HIGH (ref 70–99)
Potassium: 4.6 mEq/L (ref 3.5–5.1)
Sodium: 139 mEq/L (ref 135–145)
Total Bilirubin: 0.4 mg/dL (ref 0.2–1.2)
Total Protein: 6.6 g/dL (ref 6.0–8.3)

## 2022-06-26 LAB — HEMOGLOBIN A1C: Hgb A1c MFr Bld: 5.2 % (ref 4.6–6.5)

## 2022-06-26 MED ORDER — ALLOPURINOL 100 MG PO TABS: 100.0000 mg | ORAL_TABLET | Freq: Every day | ORAL | 5 refills | Status: DC

## 2022-06-26 MED ORDER — METFORMIN HCL 500 MG PO TABS: 500.0000 mg | ORAL_TABLET | Freq: Every day | ORAL | 3 refills | Status: DC

## 2022-06-26 MED ORDER — LISINOPRIL 40 MG PO TABS: 40.0000 mg | ORAL_TABLET | Freq: Every day | ORAL | 5 refills | Status: DC

## 2022-06-26 NOTE — Patient Instructions (Signed)
No change in medications today, let me know if any concerns on labs.  Follow-up in 6 months, keep follow-up with your specialist as planned.  Please let me know if there are questions

## 2022-06-26 NOTE — Progress Notes (Signed)
Subjective:  Patient ID: Juan Nelson, male    DOB: 10/17/1965  Age: 56 y.o. MRN: [id number]  CC:  Chief Complaint  Patient presents with   Hypertension    Pt states all is well   Prediabetes    HPI Benett B Mittman Nelson presents for   Hypertension: Lisinopril 40 mg daily. No new side effects.  Home readings: stable prior to surgery. 120-130/70-80.  Allopurinol for gout, no recent flares.  Some ibuprofen for post op pain.  BP Readings from Last 3 Encounters:  06/26/22 132/88  06/03/22 (!) 141/91  04/23/22 128/84   Lab Results  Component Value Date   CREATININE 0.97 08/01/2021   Lab Results  Component Value Date   CHOL 132 07/05/2019   HDL 31 (L) 07/05/2019   LDLCALC 76 07/05/2019   TRIG 144 07/05/2019   CHOLHDL 4.3 07/05/2019  Lipid panel normal at [facility] on 09/04/21. Next appt in January.   Prediabetes: Weight up a few pounds since last visit, did have shoulder surgery few weeks ago - recovering ok..  Followed by specialist for history of elevated LFTs with nonalcoholic steatohepatitis.  CPAP for OSA. Prior readings 160's before metformin - now 116. Started in July d/t higher readings.  No new side effects with metformin.    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   Primary hypertension 12/25/2020   GERD with apnea 12/25/2020   Other seasonal allergic rhinitis 03/17/2017   Hx of laparoscopic adjustable gastric banding 02/08/2017   History of kidney stones 04/18/2014   Essential hypertension 04/18/2014   LFT elevation 10/08/2012   Hyperglycemia 10/08/2012   Past Medical History:  Diagnosis Date   Allergy    Arthritis    right knee   GERD (gastroesophageal reflux  disease)    History of kidney stones    multople stones    Hypertension    Kidney stone    Sleep apnea    Urticaria    Past Surgical History:  Procedure Laterality Date   EXTRACORPOREAL SHOCK WAVE LITHOTRIPSY Right 11/05/2016   Procedure: RIGHT EXTRACORPOREAL SHOCK WAVE LITHOTRIPSY (ESWL);  Surgeon: Cleon Gustin, MD;  Location: [facility];  Service: Urology;  Laterality: Right;   GANGLION CYST EXCISION  1989   LAPAROSCOPIC GASTRIC BANDING     NEVUS EXCISION Right 04/28/2017   SPINE SURGERY     Allergies  Allergen Reactions   Other Anaphylaxis   Pumpkin Seed Hives and Swelling    Swelling of throat, tongue and lips. Swelling of throat, tongue and lips. Swelling of throat, tongue and lips.   Sesame Seed (Diagnostic) Anaphylaxis and Other (See Comments)   Bee Venom Swelling   Insect Extract Hives, Itching and Swelling   Mobic [Meloxicam] Nausea Only    "just didn't feel well"   Prior to Admission medications   Medication Sig Start Date End Date Taking? Authorizing Provider  allopurinol (ZYLOPRIM) 100 MG tablet TAKE 1 TABLET BY MOUTH EVERY DAY 05/19/22  Yes Wendie Agreste, MD  aspirin EC 81 MG tablet Take 81 mg by mouth daily.   Yes [provider]  cholecalciferol (VITAMIN D) 1000 UNITS  tablet Take 5,000 Units by mouth daily.   Yes [provider]  colchicine 0.6 MG tablet Take two tabs once at the first sign of a gout flare and then one an hour later. Repeat in 24 hours if needed. Patient taking differently: Take 0.12 mg by mouth daily as needed (gout). Take two tabs once at the first sign of a gout flare and then one an hour later. Repeat in 24 hours if needed. 08/16/17  Yes Tereasa Coop, PA-C  cyclobenzaprine (FLEXERIL) 10 MG tablet TAKE 1 TABLET(S) EVERY 6-8 HOURS AS NEEDED FOR SPASM 06/09/22  Yes [provider]  cyclobenzaprine (FLEXERIL) 5 MG tablet Take 5 mg by mouth 3 (three) times daily as needed for muscle spasms.   Yes [provider]  diclofenac (VOLTAREN) 75 MG EC tablet Take 1 tablet (75 mg total) by mouth 2 (two) times daily. As needed 01/29/21  Yes Wendie Agreste, MD  EPINEPHrine (AUVI-Q) 0.3 mg/0.3 mL IJ SOAJ injection Inject 0.3 mg into the muscle as needed for anaphylaxis. 10/21/21  Yes Valentina Shaggy, MD  fluticasone [facility]) 50 MCG/ACT nasal spray Place 2 sprays into both nostrils daily. 08/15/21  Yes Hawks, Alyse Low A, FNP  lisinopril (ZESTRIL) 40 MG tablet TAKE 1 TABLET BY MOUTH EVERY DAY 05/19/22  Yes Wendie Agreste, MD  loratadine (ALLERGY) 10 MG tablet Take 10 mg by mouth daily. Alessandra Bevels   Yes [provider]  Magnesium 100 MG CAPS Take by mouth.   Yes [provider]  metFORMIN (GLUCOPHAGE) 500 MG tablet Take 1 tablet (500 mg total) by mouth daily with breakfast. 03/26/22  Yes Wendie Agreste, MD  Multiple Vitamin (MULTIVITAMIN WITH MINERALS) TABS Take 1 tablet by mouth daily.   Yes [provider]  tadalafil (CIALIS) 20 MG tablet Take 20 mg by mouth daily as needed for erectile dysfunction.  10/27/19  Yes [provider]  tamsulosin (FLOMAX) 0.4 MG CAPS capsule TAKE 1 CAP DAILY AS NEEDED FOR SYMPTOMS OF KIDNEY STONE 02/23/22  Yes Wendie Agreste, MD  Turmeric (QC TUMERIC COMPLEX PO) Take by mouth.   Yes [provider]  Turmeric (QC TUMERIC COMPLEX) 500 MG CAPS  07/28/21  Yes [provider]  Arvid Right 150 MG/ML prefilled syringe INJECT 300 MG UNDER THE SKIN EVERY 28 DAYS 06/08/22  Yes Valentina Shaggy, MD  acetaminophen (TYLENOL) 500 MG tablet Take 500 mg by mouth every 6 (six) hours as needed for headache. Patient not taking: Reported on 06/26/2022    [provider]  allopurinol (ZYLOPRIM) 100 MG tablet Take 1 tablet by mouth daily.    [provider]  calcium citrate (CALCITRATE - DOSED IN MG ELEMENTAL CALCIUM) 950 (200 Ca) MG tablet Take by mouth daily. Patient not taking: Reported on 06/26/2022    [provider]  cyclobenzaprine (FLEXERIL) 10 MG tablet Take 10 mg by mouth every 6 (six) hours as needed. 06/09/22   [provider]  lisinopril (ZESTRIL) 40 MG tablet Take 1 tablet by mouth daily.    [provider]   Social History   Socioeconomic History   Marital status: Single    Spouse name: Not on file   Number of children: Not on file   Years of education: Not on file   Highest education level: Doctorate  Occupational History   Not on file  Tobacco Use   Smoking status: Never   Smokeless tobacco: Never  Vaping Use   Vaping Use:  Never used  Substance and Sexual Activity   Alcohol use: No   Drug use: No   Sexual activity: Yes  Other Topics Concern   Not on file  Social History Narrative   Lives alone   Caffeine- varies 0-1   Social Determinants of Health   Financial Resource Strain: Not on file  Food Insecurity: Not on file  Transportation Needs: Not on file  Physical Activity: Not on file  Stress: Not on file  Social Connections: Not on file  Intimate Partner Violence: Not on file    Review of Systems  Constitutional:  Negative for fatigue and unexpected weight change.  Eyes:  Negative for visual disturbance.  Respiratory:  Negative for cough, chest tightness and shortness of breath.   Cardiovascular:  Negative for chest pain, palpitations and leg swelling.  Gastrointestinal:  Negative for abdominal pain and blood in stool.  Neurological:  Negative for dizziness, light-headedness and headaches.     Objective:   Vitals:   06/26/22 0928  BP: 132/88  Pulse: 65  Temp: 98.2 F (36.8 C)  SpO2: 98%  Weight: 240 lb 9.6 oz (109.1 kg)  Height: 6' 1"  (1.854 m)     Physical Exam Vitals reviewed.  Constitutional:      Appearance: He is well-developed.  HENT:     Head: Normocephalic and atraumatic.  Neck:     Vascular: No carotid bruit or JVD.  Cardiovascular:     Rate and Rhythm: Normal rate and regular rhythm.     Heart sounds:  Normal heart sounds. No murmur heard. Pulmonary:     Effort: Pulmonary effort is normal.     Breath sounds: Normal breath sounds. No rales.  Musculoskeletal:     Right lower leg: No edema.     Left lower leg: No edema.  Skin:    General: Skin is warm and dry.  Neurological:     Mental Status: He is alert and oriented to person, place, and time.  Psychiatric:        Mood and Affect: Mood normal.        Assessment & Plan:  URIYAH RASKA Nelson is a 56 y.o. male . History of gout - Plan: allopurinol (ZYLOPRIM) 100 MG tablet  - no recent flare. Continue allopurinol same dose.  RTC precautions  Essential hypertension - Plan: lisinopril (ZESTRIL) 40 MG tablet, Comprehensive metabolic panel  -  Stable, tolerating current regimen. Medications refilled. Labs pending as above.   Prediabetes - Plan: metFORMIN (GLUCOPHAGE) 500 MG tablet, Hemoglobin A1c  -Check A1c, tolerating metformin, check CMP regimen.  Recheck 6 months.  Meds ordered this encounter  Medications   allopurinol (ZYLOPRIM) 100 MG tablet    Sig: Take 1 tablet (100 mg total) by mouth daily.    Dispense:  30 tablet    Refill:  5   lisinopril (ZESTRIL) 40 MG tablet    Sig: Take 1 tablet (40 mg total) by mouth daily.    Dispense:  30 tablet    Refill:  5   metFORMIN (GLUCOPHAGE) 500 MG tablet    Sig: Take 1 tablet (500 mg total) by mouth daily with breakfast.    Dispense:  30 tablet    Refill:  3   Patient Instructions  No change in medications today, let me know if any concerns on labs.  Follow-up in 6 months, keep follow-up with your specialist as planned.  Please let me know if there are questions    Signed,  Merri Ray, MD [place], [facility] 06/26/22 10:10 AM

## 2022-07-02 DIAGNOSIS — M25512 Pain in left shoulder: Secondary | ICD-10-CM | POA: Diagnosis not present

## 2022-07-05 DIAGNOSIS — G4733 Obstructive sleep apnea (adult) (pediatric): Secondary | ICD-10-CM | POA: Diagnosis not present

## 2022-07-07 DIAGNOSIS — M25512 Pain in left shoulder: Secondary | ICD-10-CM | POA: Diagnosis not present

## 2022-07-14 DIAGNOSIS — M25512 Pain in left shoulder: Secondary | ICD-10-CM | POA: Diagnosis not present

## 2022-07-16 ENCOUNTER — Encounter: Payer: Self-pay | Admitting: Family Medicine

## 2022-07-16 DIAGNOSIS — R7303 Prediabetes: Secondary | ICD-10-CM

## 2022-07-16 MED ORDER — BLOOD GLUCOSE TEST STRIPS 333 VI STRP: ORAL_STRIP | 0 refills | Status: DC

## 2022-07-16 NOTE — Telephone Encounter (Signed)
Patient is requesting one touch test strips however I do not see glucose monitoring on his medication list can you please advise frequency if patient is supposed to be testing

## 2022-07-20 DIAGNOSIS — M25512 Pain in left shoulder: Secondary | ICD-10-CM | POA: Diagnosis not present

## 2022-07-21 NOTE — Telephone Encounter (Signed)
Can you please review this? 5 days since sending

## 2022-07-21 NOTE — Telephone Encounter (Signed)
Please see medication list.  I received request on 07/16/22 and sent in that prescription on 07/16/22.  Did pharmacy not receive it?  Please let me know how I can help further.

## 2022-07-21 NOTE — Telephone Encounter (Signed)
Spoke to the pt and he states he picked his test strips up on Sunday 07/19/22 @ [facility] . I did speak to the pharmacist at [facility] and he states the new Rx has been placed on hold . Pt states he is sorry for any confusion

## 2022-07-22 DIAGNOSIS — M25512 Pain in left shoulder: Secondary | ICD-10-CM | POA: Diagnosis not present

## 2022-07-28 DIAGNOSIS — M25512 Pain in left shoulder: Secondary | ICD-10-CM | POA: Diagnosis not present

## 2022-07-30 DIAGNOSIS — M25512 Pain in left shoulder: Secondary | ICD-10-CM | POA: Diagnosis not present

## 2022-08-03 DIAGNOSIS — G4733 Obstructive sleep apnea (adult) (pediatric): Secondary | ICD-10-CM | POA: Diagnosis not present

## 2022-08-04 ENCOUNTER — Ambulatory Visit (INDEPENDENT_AMBULATORY_CARE_PROVIDER_SITE_OTHER): Payer: BC Managed Care – PPO

## 2022-08-04 DIAGNOSIS — M25512 Pain in left shoulder: Secondary | ICD-10-CM | POA: Diagnosis not present

## 2022-08-04 DIAGNOSIS — L501 Idiopathic urticaria: Secondary | ICD-10-CM | POA: Diagnosis not present

## 2022-08-04 DIAGNOSIS — G4733 Obstructive sleep apnea (adult) (pediatric): Secondary | ICD-10-CM | POA: Diagnosis not present

## 2022-08-06 DIAGNOSIS — M25512 Pain in left shoulder: Secondary | ICD-10-CM | POA: Diagnosis not present

## 2022-08-08 ENCOUNTER — Telehealth: Payer: BC Managed Care – PPO | Admitting: Nurse Practitioner

## 2022-08-08 DIAGNOSIS — R6889 Other general symptoms and signs: Secondary | ICD-10-CM

## 2022-08-08 MED ORDER — PSEUDOEPH-BROMPHEN-DM 30-2-10 MG/5ML PO SYRP: 5.0000 mL | ORAL_SOLUTION | Freq: Four times a day (QID) | ORAL | 0 refills | Status: DC | PRN

## 2022-08-08 MED ORDER — OSELTAMIVIR PHOSPHATE 75 MG PO CAPS: 75.0000 mg | ORAL_CAPSULE | Freq: Two times a day (BID) | ORAL | 0 refills | Status: AC

## 2022-08-08 NOTE — Progress Notes (Signed)
I have spent 5 minutes in review of e-visit questionnaire, review and updating patient chart, medical decision making and response to patient.  ° °Juan Monje W Yamira Papa, NP ° °  °

## 2022-08-08 NOTE — Progress Notes (Signed)

## 2022-08-11 DIAGNOSIS — M25512 Pain in left shoulder: Secondary | ICD-10-CM | POA: Diagnosis not present

## 2022-08-13 DIAGNOSIS — M25512 Pain in left shoulder: Secondary | ICD-10-CM | POA: Diagnosis not present

## 2022-08-17 DIAGNOSIS — M25512 Pain in left shoulder: Secondary | ICD-10-CM | POA: Diagnosis not present

## 2022-08-20 DIAGNOSIS — M25512 Pain in left shoulder: Secondary | ICD-10-CM | POA: Diagnosis not present

## 2022-08-26 DIAGNOSIS — M25512 Pain in left shoulder: Secondary | ICD-10-CM | POA: Diagnosis not present

## 2022-09-03 DIAGNOSIS — M25512 Pain in left shoulder: Secondary | ICD-10-CM | POA: Diagnosis not present

## 2022-09-03 DIAGNOSIS — G4733 Obstructive sleep apnea (adult) (pediatric): Secondary | ICD-10-CM | POA: Diagnosis not present

## 2022-09-08 DIAGNOSIS — M25512 Pain in left shoulder: Secondary | ICD-10-CM | POA: Diagnosis not present

## 2022-09-08 DIAGNOSIS — K7581 Nonalcoholic steatohepatitis (NASH): Secondary | ICD-10-CM | POA: Diagnosis not present

## 2022-09-12 ENCOUNTER — Other Ambulatory Visit: Payer: Self-pay | Admitting: Family Medicine

## 2022-09-12 DIAGNOSIS — R7303 Prediabetes: Secondary | ICD-10-CM

## 2022-09-15 ENCOUNTER — Ambulatory Visit (INDEPENDENT_AMBULATORY_CARE_PROVIDER_SITE_OTHER): Payer: BC Managed Care – PPO

## 2022-09-15 DIAGNOSIS — L501 Idiopathic urticaria: Secondary | ICD-10-CM | POA: Diagnosis not present

## 2022-09-17 DIAGNOSIS — M25512 Pain in left shoulder: Secondary | ICD-10-CM | POA: Diagnosis not present

## 2022-09-24 DIAGNOSIS — M25512 Pain in left shoulder: Secondary | ICD-10-CM | POA: Diagnosis not present

## 2022-09-29 DIAGNOSIS — M25511 Pain in right shoulder: Secondary | ICD-10-CM | POA: Diagnosis not present

## 2022-09-30 DIAGNOSIS — E119 Type 2 diabetes mellitus without complications: Secondary | ICD-10-CM | POA: Diagnosis not present

## 2022-09-30 DIAGNOSIS — H2513 Age-related nuclear cataract, bilateral: Secondary | ICD-10-CM | POA: Diagnosis not present

## 2022-10-04 DIAGNOSIS — G4733 Obstructive sleep apnea (adult) (pediatric): Secondary | ICD-10-CM | POA: Diagnosis not present

## 2022-10-22 ENCOUNTER — Telehealth: Payer: Self-pay

## 2022-10-22 NOTE — Telephone Encounter (Signed)
Received fax from [facility] target delivery date: 10/22/22 to our office.  Forwarding message to Tammy as update.

## 2022-10-27 ENCOUNTER — Ambulatory Visit (INDEPENDENT_AMBULATORY_CARE_PROVIDER_SITE_OTHER): Payer: BC Managed Care – PPO

## 2022-10-27 DIAGNOSIS — L501 Idiopathic urticaria: Secondary | ICD-10-CM | POA: Diagnosis not present

## 2022-11-02 DIAGNOSIS — G4733 Obstructive sleep apnea (adult) (pediatric): Secondary | ICD-10-CM | POA: Diagnosis not present

## 2022-11-03 DIAGNOSIS — G4733 Obstructive sleep apnea (adult) (pediatric): Secondary | ICD-10-CM | POA: Diagnosis not present

## 2022-11-04 ENCOUNTER — Other Ambulatory Visit: Payer: Self-pay | Admitting: Family Medicine

## 2022-11-04 DIAGNOSIS — R7303 Prediabetes: Secondary | ICD-10-CM

## 2022-11-05 ENCOUNTER — Encounter: Payer: Self-pay | Admitting: Family Medicine

## 2022-11-05 DIAGNOSIS — R7303 Prediabetes: Secondary | ICD-10-CM

## 2022-11-05 MED ORDER — METFORMIN HCL 500 MG PO TABS: 500.0000 mg | ORAL_TABLET | Freq: Every day | ORAL | 3 refills | Status: DC

## 2022-11-09 ENCOUNTER — Encounter: Payer: Self-pay | Admitting: *Deleted

## 2022-11-16 ENCOUNTER — Telehealth: Payer: Self-pay | Admitting: *Deleted

## 2022-11-16 MED ORDER — XOLAIR 150 MG ~~LOC~~ SOLR: 300.0000 mg | SUBCUTANEOUS | 11 refills | Status: DC

## 2022-11-16 NOTE — Telephone Encounter (Signed)
Spoke to patient by phone will change to vials per his request

## 2022-11-16 NOTE — Telephone Encounter (Signed)
Called and spoke to patient and he wants to go back to vials instead of prefilled syringe or autoinjectors due to less reaction to injs prior will get approval and send new rx

## 2022-11-17 NOTE — Telephone Encounter (Signed)
Sounds like a good plan - so he will need to come in again for injections?   Salvatore Marvel, MD [facility] [place]

## 2022-11-19 ENCOUNTER — Other Ambulatory Visit: Payer: Self-pay | Admitting: Family Medicine

## 2022-11-19 DIAGNOSIS — R7303 Prediabetes: Secondary | ICD-10-CM

## 2022-12-03 DIAGNOSIS — G4733 Obstructive sleep apnea (adult) (pediatric): Secondary | ICD-10-CM | POA: Diagnosis not present

## 2022-12-15 ENCOUNTER — Ambulatory Visit (INDEPENDENT_AMBULATORY_CARE_PROVIDER_SITE_OTHER): Payer: BC Managed Care – PPO

## 2022-12-15 DIAGNOSIS — L501 Idiopathic urticaria: Secondary | ICD-10-CM | POA: Diagnosis not present

## 2022-12-17 ENCOUNTER — Encounter: Payer: Self-pay | Admitting: Family Medicine

## 2022-12-17 DIAGNOSIS — I10 Essential (primary) hypertension: Secondary | ICD-10-CM | POA: Diagnosis not present

## 2022-12-21 ENCOUNTER — Encounter: Payer: Self-pay | Admitting: Family Medicine

## 2022-12-21 ENCOUNTER — Ambulatory Visit: Payer: BC Managed Care – PPO | Admitting: Family Medicine

## 2022-12-21 VITALS — BP 136/86 | HR 72 | Temp 98.7°F | Ht 73.0 in | Wt 243.2 lb

## 2022-12-21 DIAGNOSIS — M62561 Muscle wasting and atrophy, not elsewhere classified, right lower leg: Secondary | ICD-10-CM | POA: Diagnosis not present

## 2022-12-21 DIAGNOSIS — Z9889 Other specified postprocedural states: Secondary | ICD-10-CM

## 2022-12-21 NOTE — Patient Instructions (Signed)
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  If any weakness, or new pain/numbness in area be seen or I can refer you back to neurosurgery.  It is possible that neurology may want you to see neurosurgery but can start with neuro eval first.  Let me know if there are questions.

## 2022-12-21 NOTE — Progress Notes (Signed)
Subjective:  Patient ID: Juan Nelson, male    DOB: 11-15-1965  Age: 57 y.o. MRN: [id number]  CC:  Chief Complaint  Patient presents with   Leg Injury    Pt states he has right calf muscle waisting     HPI Juan Nelson presents for   Right calf muscle wasting Did not some soreness of calf at visit in March 2022 with negative D-dimer at that time.  Possible strain. Noted smaller calf muscle on right at his physical in 2022.  Prior back surgery with some limp at times on the right leg, neurosurgeon was Dr. Jeral Fruit -recommended follow-up with his surgeon at that time.  He has seen Dr. 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  Has noticed over past few months more wasting of R calf muscle. No pain. Slight limp- feels more pronounced. No limitations in activity.  No new back pain - occasional soreness. No radicular leg pain, no left sided symptoms.  No recent visit with neurosurgery.     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   Primary hypertension 12/25/2020   GERD with apnea 12/25/2020   Other seasonal allergic rhinitis 03/17/2017   Hx of laparoscopic adjustable gastric banding 02/08/2017   History of kidney stones 04/18/2014   Essential hypertension 04/18/2014   LFT elevation 10/08/2012   Hyperglycemia 10/08/2012   Past Medical History:  Diagnosis Date   Allergy    Arthritis    right knee   GERD (gastroesophageal reflux disease)    History of kidney stones    multople stones    Hypertension    Kidney stone    Sleep apnea    Urticaria    Past Surgical History:  Procedure Laterality Date   EXTRACORPOREAL SHOCK WAVE LITHOTRIPSY Right 11/05/2016   Procedure: RIGHT EXTRACORPOREAL SHOCK WAVE LITHOTRIPSY (ESWL);  Surgeon: Malen Gauze, MD;  Location: [facility];  Service: Urology;  Laterality:  Right;   GANGLION CYST EXCISION  1989   LAPAROSCOPIC GASTRIC BANDING     NEVUS EXCISION Right 04/28/2017   SPINE SURGERY     Allergies  Allergen Reactions   Other Anaphylaxis   Pumpkin Seed Hives and Swelling    Swelling of throat, tongue and lips. Swelling of throat, tongue and lips. Swelling of throat, tongue and lips.   Sesame Seed (Diagnostic) Anaphylaxis and Other (See Comments)   Bee Venom Swelling   Insect Extract Hives, Itching and Swelling   Mobic [Meloxicam] Nausea Only    "just didn't feel well"   Prior to Admission medications   Medication Sig Start Date End Date Taking? Authorizing Provider  acetaminophen (TYLENOL) 500 MG tablet Take 500 mg by mouth every 6 (six) hours as needed for headache.   Yes [provider]  allopurinol (ZYLOPRIM) 100 MG tablet Take 1 tablet (100 mg total) by mouth daily. 06/26/22  Yes Shade Flood, MD  aspirin EC 81 MG tablet Take 81 mg by mouth daily.   Yes [provider]  brompheniramine-pseudoephedrine-DM 30-2-10 MG/5ML syrup Take 5 mLs by mouth 4 (four) times daily as needed. 08/08/22  Yes Claiborne Rigg, NP  calcium citrate (CALCITRATE - DOSED IN MG ELEMENTAL CALCIUM) 950 (200 Ca) MG tablet Take by mouth daily.   Yes [provider]  cholecalciferol (VITAMIN D) 1000 UNITS  tablet Take 5,000 Units by mouth daily.   Yes [provider]  colchicine 0.6 MG tablet Take two tabs once at the first sign of a gout flare and then one an hour later. Repeat in 24 hours if needed. Patient taking differently: Take 0.12 mg by mouth daily as needed (gout). Take two tabs once at the first sign of a gout flare and then one an hour later. Repeat in 24 hours if needed. 08/16/17  Yes Ofilia Neas, PA-C  cyclobenzaprine (FLEXERIL) 10 MG tablet TAKE 1 TABLET(S) EVERY 6-8 HOURS AS NEEDED FOR SPASM 06/09/22  Yes [provider]  diclofenac (VOLTAREN) 75 MG EC tablet Take 1 tablet (75 mg total) by mouth 2 (two)  times daily. As needed 01/29/21  Yes Shade Flood, MD  EPINEPHrine (AUVI-Q) 0.3 mg/0.3 mL IJ SOAJ injection Inject 0.3 mg into the muscle as needed for anaphylaxis. 10/21/21  Yes Alfonse Spruce, MD  fluticasone [facility]) 50 MCG/ACT nasal spray Place 2 sprays into both nostrils daily. 08/15/21  Yes Hawks, Christy A, FNP  lisinopril (ZESTRIL) 40 MG tablet Take 1 tablet (40 mg total) by mouth daily. 06/26/22  Yes Shade Flood, MD  loratadine (ALLERGY) 10 MG tablet Take 10 mg by mouth daily. Meriam Sprague   Yes [provider]  Magnesium 100 MG CAPS Take by mouth.   Yes [provider]  metFORMIN (GLUCOPHAGE) 500 MG tablet Take 1 tablet (500 mg total) by mouth daily with breakfast. 11/05/22  Yes Shade Flood, MD  Multiple Vitamin (MULTIVITAMIN WITH MINERALS) TABS Take 1 tablet by mouth daily.   Yes [provider]  omalizumab [facility]) 150 MG injection Inject 300 mg into the skin every 28 (twenty-eight) days. 11/16/22  Yes Alfonse Spruce, MD  [facility] VERIO test strip CHECK BLOOD SUGAR UP TO ONCE PER DAY. 11/19/22  Yes Shade Flood, MD  tadalafil (CIALIS) 20 MG tablet Take 20 mg by mouth daily as needed for erectile dysfunction.  10/27/19  Yes [provider]  tamsulosin (FLOMAX) 0.4 MG CAPS capsule TAKE 1 CAP DAILY AS NEEDED FOR SYMPTOMS OF KIDNEY STONE 02/23/22  Yes Shade Flood, MD  Turmeric (QC TUMERIC COMPLEX PO) Take by mouth.   Yes [provider]  cyclobenzaprine (FLEXERIL) 10 MG tablet Take 10 mg by mouth every 6 (six) hours as needed. 06/09/22   [provider]  cyclobenzaprine (FLEXERIL) 5 MG tablet Take 5 mg by mouth 3 (three) times daily as needed for muscle spasms.    [provider]  Turmeric (QC TUMERIC COMPLEX) 500 MG CAPS  07/28/21   [provider]   Social History   Socioeconomic History   Marital status: Single    Spouse name: Not on file   Number of children: Not on file   Years of  education: Not on file   Highest education level: Doctorate  Occupational History   Not on file  Tobacco Use   Smoking status: Never   Smokeless tobacco: Never  Vaping Use   Vaping Use: Never used  Substance and Sexual Activity   Alcohol use: No   Drug use: No   Sexual activity: Yes  Other Topics Concern   Not on file  Social History Narrative   Lives alone   Caffeine- varies 0-1   Social Determinants of Health   Financial Resource Strain: Low Risk  (12/18/2022)   Overall Financial Resource Strain (CARDIA)    Difficulty of Paying Living Expenses: Not  hard at all  Food Insecurity: No Food Insecurity (12/18/2022)   Hunger Vital Sign    Worried About Running Out of Food in the Last Year: Never true    Ran Out of Food in the Last Year: Never true  Transportation Needs: No Transportation Needs (12/18/2022)   PRAPARE - Administrator, Civil Service (Medical): No    Lack of Transportation (Non-Medical): No  Physical Activity: Sufficiently Active (12/18/2022)   Exercise Vital Sign    Days of Exercise per Week: 5 days    Minutes of Exercise per Session: 50 min  Stress: No Stress Concern Present (12/18/2022)   [employer] of Occupational Health - Occupational Stress Questionnaire    Feeling of Stress : Only a little  Social Connections: Moderately Integrated (12/18/2022)   Social Connection and Isolation Panel [NHANES]    Frequency of Communication with Friends and Family: More than three times a week    Frequency of Social Gatherings with Friends and Family: Three times a week    Attends Religious Services: More than 4 times per year    Active Member of Clubs or Organizations: Yes    Attends Banker Meetings: More than 4 times per year    Marital Status: Never married  Intimate Partner Violence: Not on file    Review of Systems Per HPI.   Objective:   Vitals:   12/21/22 0838 12/21/22 0911  BP: (!) 146/88 136/86  Pulse: 72   Temp: 98.7 F (37.1  C)   TempSrc: Temporal   SpO2: 98%   Weight: 243 lb 3.2 oz (110.3 kg)   Height:  (1.854 m)      Physical Exam Musculoskeletal:     Comments: R calf atrophy/smaller vs left. Nontender, no cords, neg homans. Snesation intact. able to toe walk/plantar flex. Circumference 36cm on R at 15cm below patella, vs 40cm on left.    No rash, sensation intact distally with negative seated straight leg raise.     Assessment & Plan:  Juan Nelson is a 57 y.o. male . Right calf atrophy - Plan: Ambulatory referral to Neurology  History of lumbar surgery Atrophy known, thought to be related to lumbar spine disease but reports recent progression.  No pain, no debility or limitations in activity.  Potentially may need to see neurosurgery but without new back pain, dysesthesias or radicular symptoms will have evaluated with neurology first to decide on further testing such as EMG/NCS.  RTC precautions.  No orders of the defined types were placed in this encounter.  Patient Instructions  As we discussed the calf atrophy could still be related to your lumbar spine disease but I will refer you to neurology initially to decide if other testing needed.  If any weakness, or new pain/numbness in area be seen or I can refer you back to neurosurgery.  It is possible that neurology may want you to see neurosurgery but can start with neuro eval first.  Let me know if there are questions.     Signed,   Meredith Staggers, MD  Primary Care, [facility] 12/21/22 9:11 AM

## 2022-12-29 ENCOUNTER — Ambulatory Visit (INDEPENDENT_AMBULATORY_CARE_PROVIDER_SITE_OTHER): Payer: BC Managed Care – PPO | Admitting: Allergy & Immunology

## 2022-12-29 ENCOUNTER — Encounter: Payer: Self-pay | Admitting: Allergy & Immunology

## 2022-12-29 VITALS — BP 136/84 | HR 72 | Temp 98.0°F | Resp 18 | Ht 72.84 in | Wt 244.4 lb

## 2022-12-29 DIAGNOSIS — L501 Idiopathic urticaria: Secondary | ICD-10-CM | POA: Diagnosis not present

## 2022-12-29 DIAGNOSIS — J3089 Other allergic rhinitis: Secondary | ICD-10-CM

## 2022-12-29 DIAGNOSIS — J302 Other seasonal allergic rhinitis: Secondary | ICD-10-CM

## 2022-12-29 MED ORDER — FLUTICASONE PROPIONATE 50 MCG/ACT NA SUSP: 2.0000 | Freq: Every day | NASAL | 5 refills | Status: DC

## 2022-12-29 MED ORDER — LORATADINE 10 MG PO TABS: 10.0000 mg | ORAL_TABLET | Freq: Every day | ORAL | 1 refills | Status: AC

## 2022-12-29 MED ORDER — EPINEPHRINE 0.3 MG/0.3ML IJ SOAJ: 0.3000 mg | INTRAMUSCULAR | 2 refills | Status: AC | PRN

## 2022-12-29 MED ORDER — IPRATROPIUM BROMIDE 0.03 % NA SOLN: 2.0000 | Freq: Three times a day (TID) | NASAL | 12 refills | Status: DC

## 2022-12-29 NOTE — Patient Instructions (Addendum)
1. 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 - EpiPen is up to date. - Continue to avoid all of your triggering symptoms.  2. Seasonal allergic rhinitis (trees and cats) - Continue with antihistamines as needed.  - Add on ipratropium nasal spray at night to see how things go.  - This can be over drying, so be careful.   3. Return in about 1 year (around 12/29/2023).    Please inform us of any Emergency Department visits, hospitalizations, or changes in symptoms. Call us before going to the ED for breathing or allergy symptoms since we might be able to fit you in for a sick visit. Feel free to contact us anytime with any questions, problems, or concerns.  It was a pleasure to see you again today!  Websites that have reliable patient information: 1. American Academy of Asthma, Allergy, and Immunology: www.aaaai.org 2. Food Allergy Research and Education (FARE): foodallergy.org 3. Mothers of Asthmatics: http://www.asthmacommunitynetwork.org 4. American College of Allergy, Asthma, and Immunology: www.acaai.org   COVID-19 Vaccine Information can be found at: [email or web address] For questions related to vaccine distribution or appointments, please email [email or web address] or call [phone].   We realize that you might be concerned about having an allergic reaction to the COVID19 vaccines. To help with that concern, WE ARE OFFERING THE COVID19 VACCINES IN OUR OFFICE! Ask the front desk for dates!     "Like" [place] on Facebook and Instagram for our latest updates!      A healthy democracy works best when [employer] participate! Make sure you are registered to vote! If you have moved or changed any of your contact information, you will need to get this updated before voting!  In some cases, you MAY be able to register to vote online:  [email or web address]

## 2022-12-29 NOTE — Progress Notes (Signed)
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 - EpiPen is up to date. - Continue to avoid all of your triggering symptoms.  2. Seasonal allergic rhinitis (trees and cats) - Continue with antihistamines as needed.  - Add on ipratropium nasal spray at night to see how things go.  - This can be over drying, so be careful.   3. Return in about 1 year (around 12/29/2023).   Subjective:   Juan Nelson is a 57 y.o. male presenting today for follow up of  Chief Complaint  Patient presents with   Urticaria    No issues     Juan Nelson has a history of the following: Patient Active Problem List   Diagnosis Date Noted   OSA on CPAP 12/25/2020   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   Primary hypertension 12/25/2020   GERD with apnea 12/25/2020   Other seasonal allergic rhinitis 03/17/2017   Hx of laparoscopic adjustable gastric banding 02/08/2017   History of kidney stones 04/18/2014   Essential hypertension 04/18/2014   LFT elevation 10/08/2012   Hyperglycemia 10/08/2012    History obtained from: chart review and patient.  Juan Nelson is a 57 y.o. male presenting for a follow up visit.  He was last seen in February 2023.  At that time, we continue with Xolair every 6 weeks.  His EpiPen was up-to-date.  We recommended continued avoidance of all of his triggering symptoms.  Recommended using cetirizine instead of Benadryl for future episodes.  Since last visit, he has done well.  Allergic Rhinitis Symptom History: He has no problems with seasonal allergies. He has a CPAP at night and has drainage when he first lies down.  He does not really do anything about it.  He is open to trying a nasal spray if we  think it might help.   Food Allergy Symptom History: He continues to avoid sesame and pumpkin seeds. He still avoids sesame and pumpkin seeds. He sees sesame pop up more often. He is very careful and "boring" with bread.   Skin Symptom History: He was having some more pain with the prefilled syringes. But he switched back to the vial. He thinks that the needle is larger in te prefilled syringes. It is working well now. He is still doing this every six weeks.  The copay card is still working to cover the cost of his medications.   He is having some problems with his right calf. He sustained an injury to his back years ago and had to undergo surgery to help with the nerve impingement. This stabilized there calf atrophy, but it has now worsened acutely. He is going to see neurosurgery for an emergent release of the nerve.  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 There were no major leaks over the organ thankfully, so his musical instruments were not harmed.  Otherwise, there have been no changes to his past medical history, surgical history, family history, or social history.    Review of Systems  Constitutional: Negative.  Negative for fever, malaise/fatigue and weight loss.  HENT: Negative.  Negative for congestion, ear discharge and ear pain.   Eyes:  Negative  for pain, discharge and redness.  Respiratory:  Negative for cough, sputum production, shortness of breath and wheezing.   Cardiovascular: Negative.  Negative for chest pain and palpitations.  Gastrointestinal:  Negative for abdominal pain, heartburn, nausea and vomiting.  Skin: Negative.  Negative for itching and rash.  Neurological:  Negative for dizziness and headaches.  Endo/Heme/Allergies:  Positive for environmental allergies. Does not bruise/bleed easily.       Objective:   Blood pressure 136/84, pulse 72, temperature 98 F (36.7 C), resp. rate 18, height 6' 0.84" (1.85 m), weight 244 lb 6.4 oz  (110.9 kg), SpO2 97 %. Body mass index is 32.39 kg/m.    Physical Exam Vitals reviewed.  Constitutional:      Appearance: He is well-developed.     Comments: Pleasant male.  Cooperative with the exam.  HENT:     Head: Normocephalic and atraumatic.     Right Ear: Tympanic membrane, ear canal and external ear normal.     Left Ear: Tympanic membrane, ear canal and external ear normal.     Nose: No nasal deformity, septal deviation, mucosal edema or rhinorrhea.     Right Turbinates: Enlarged and swollen.     Left Turbinates: Enlarged and swollen.     Right Sinus: No maxillary sinus tenderness or frontal sinus tenderness.     Left Sinus: No maxillary sinus tenderness or frontal sinus tenderness.     Mouth/Throat:     Mouth: Mucous membranes are not pale and not dry.     Pharynx: Uvula midline.  Eyes:     General:        Right eye: No discharge.        Left eye: No discharge.     Conjunctiva/sclera: Conjunctivae normal.     Right eye: Right conjunctiva is not injected. No chemosis.    Left eye: Left conjunctiva is not injected. No chemosis.    Pupils: Pupils are equal, round, and reactive to light.  Cardiovascular:     Rate and Rhythm: Normal rate and regular rhythm.     Heart sounds: Normal heart sounds.  Pulmonary:     Effort: Pulmonary effort is normal. No tachypnea, accessory muscle usage or respiratory distress.     Breath sounds: Normal breath sounds. No wheezing, rhonchi or rales.     Comments: Moving air well in all lung fields. Chest:     Chest wall: No tenderness.  Lymphadenopathy:     Cervical: No cervical adenopathy.  Skin:    Coloration: Skin is not pale.     Findings: No abrasion, erythema, petechiae or rash. Rash is not papular, urticarial or vesicular.  Neurological:     Mental Status: He is alert.  Psychiatric:        Behavior: Behavior is cooperative.      Diagnostic studies: none     Malachi Bonds, MD  [facility]

## 2022-12-30 DIAGNOSIS — D649 Anemia, unspecified: Secondary | ICD-10-CM | POA: Diagnosis not present

## 2022-12-30 DIAGNOSIS — I10 Essential (primary) hypertension: Secondary | ICD-10-CM | POA: Diagnosis not present

## 2022-12-30 DIAGNOSIS — E559 Vitamin D deficiency, unspecified: Secondary | ICD-10-CM | POA: Diagnosis not present

## 2023-01-01 ENCOUNTER — Ambulatory Visit: Payer: BC Managed Care – PPO | Admitting: Family Medicine

## 2023-01-02 DIAGNOSIS — G4733 Obstructive sleep apnea (adult) (pediatric): Secondary | ICD-10-CM | POA: Diagnosis not present

## 2023-01-04 ENCOUNTER — Encounter: Payer: Self-pay | Admitting: Family Medicine

## 2023-01-04 ENCOUNTER — Ambulatory Visit: Payer: BC Managed Care – PPO | Admitting: Family Medicine

## 2023-01-04 VITALS — BP 138/92 | HR 70 | Temp 98.3°F | Ht 72.0 in | Wt 243.8 lb

## 2023-01-04 DIAGNOSIS — R7303 Prediabetes: Secondary | ICD-10-CM

## 2023-01-04 DIAGNOSIS — Z8739 Personal history of other diseases of the musculoskeletal system and connective tissue: Secondary | ICD-10-CM

## 2023-01-04 DIAGNOSIS — Z87442 Personal history of urinary calculi: Secondary | ICD-10-CM

## 2023-01-04 DIAGNOSIS — I10 Essential (primary) hypertension: Secondary | ICD-10-CM

## 2023-01-04 LAB — COMPREHENSIVE METABOLIC PANEL WITH GFR
ALT: 40 U/L (ref 0–53)
AST: 30 U/L (ref 0–37)
Albumin: 4.2 g/dL (ref 3.5–5.2)
Alkaline Phosphatase: 79 U/L (ref 39–117)
BUN: 14 mg/dL (ref 6–23)
CO2: 27 meq/L (ref 19–32)
Calcium: 8.8 mg/dL (ref 8.4–10.5)
Chloride: 105 meq/L (ref 96–112)
Creatinine, Ser: 0.89 mg/dL (ref 0.40–1.50)
GFR: 94.55 mL/min
Glucose, Bld: 105 mg/dL — ABNORMAL HIGH (ref 70–99)
Potassium: 4 meq/L (ref 3.5–5.1)
Sodium: 140 meq/L (ref 135–145)
Total Bilirubin: 0.6 mg/dL (ref 0.2–1.2)
Total Protein: 6.7 g/dL (ref 6.0–8.3)

## 2023-01-04 LAB — HEMOGLOBIN A1C: Hgb A1c MFr Bld: 5.7 % (ref 4.6–6.5)

## 2023-01-04 MED ORDER — METFORMIN HCL 500 MG PO TABS: 500.0000 mg | ORAL_TABLET | Freq: Every day | ORAL | 3 refills | Status: DC

## 2023-01-04 MED ORDER — AMLODIPINE BESYLATE 2.5 MG PO TABS: 2.5000 mg | ORAL_TABLET | Freq: Every day | ORAL | 1 refills | Status: DC

## 2023-01-04 MED ORDER — LISINOPRIL 40 MG PO TABS: 40.0000 mg | ORAL_TABLET | Freq: Every day | ORAL | 5 refills | Status: DC

## 2023-01-04 MED ORDER — ALLOPURINOL 100 MG PO TABS: 100.0000 mg | ORAL_TABLET | Freq: Every day | ORAL | 5 refills | Status: DC

## 2023-01-04 NOTE — Patient Instructions (Addendum)
Thank you for coming in today.  Lets try adding low-dose amlodipine once per day, continue same dose lisinopril.  See information below on blood pressure.  If readings continue to remain elevated, let me know and we can make further changes.  Keep up the good work with exercise, watching diet, I will check prediabetes test today.  No change in metformin for now and continue allopurinol for gout prevention. Take care!  Managing Your Hypertension Hypertension, also called high blood pressure, is when the force of the blood pressing against the walls of the arteries is too strong. Arteries are blood vessels that carry blood from your heart throughout your body. Hypertension forces the heart to work harder to pump blood and may cause the arteries to become narrow or stiff. Understanding blood pressure readings A blood pressure reading includes a higher number over a lower number: The first, or top, number is called the systolic pressure. It is a measure of the pressure in your arteries as your heart beats. The second, or bottom number, is called the diastolic pressure. It is a measure of the pressure in your arteries as the heart relaxes. For most people, a normal blood pressure is below 120/80. Your personal target blood pressure may vary depending on your medical conditions, your age, and other factors. Blood pressure is classified into four stages. Based on your blood pressure reading, your health care provider may use the following stages to determine what type of treatment you need, if any. Systolic pressure and diastolic pressure are measured in a unit called millimeters of mercury (mmHg). Normal Systolic pressure: below 120. Diastolic pressure: below 80. Elevated Systolic pressure: 120-129. Diastolic pressure: below 80. Hypertension stage 1 Systolic pressure: 130-139. Diastolic pressure: 80-89. Hypertension stage 2 Systolic pressure: 140 or above. Diastolic pressure: 90 or above. How can  this condition affect me? Managing your hypertension is very important. Over time, hypertension can damage the arteries and decrease blood flow to parts of the body, including the brain, heart, and kidneys. Having untreated or uncontrolled hypertension can lead to: A heart attack. A stroke. A weakened blood vessel (aneurysm). Heart failure. Kidney damage. Eye damage. Memory and concentration problems. Vascular dementia. What actions can I take to manage this condition? Hypertension can be managed by making lifestyle changes and possibly by taking medicines. Your health care provider will help you make a plan to bring your blood pressure within a normal range. You may be referred for counseling on a healthy diet and physical activity. Nutrition  Eat a diet that is high in fiber and potassium, and low in salt (sodium), added sugar, and fat. An example eating plan is called the DASH diet. DASH stands for Dietary Approaches to Stop Hypertension. To eat this way: Eat plenty of fresh fruits and vegetables. Try to fill one-half of your plate at each meal with fruits and vegetables. Eat whole grains, such as whole-wheat pasta, brown rice, or whole-grain bread. Fill about one-fourth of your plate with whole grains. Eat low-fat dairy products. Avoid fatty cuts of meat, processed or cured meats, and poultry with skin. Fill about one-fourth of your plate with lean proteins such as fish, chicken without skin, beans, eggs, and tofu. Avoid pre-made and processed foods. These tend to be higher in sodium, added sugar, and fat. Reduce your daily sodium intake. Many people with hypertension should eat less than 1,500 mg of sodium a day. Lifestyle  Work with your health care provider to maintain a healthy body weight or to  lose weight. Ask what an ideal weight is for you. Get at least 30 minutes of exercise that causes your heart to beat faster (aerobic exercise) most days of the week. Activities may include  walking, swimming, or biking. Include exercise to strengthen your muscles (resistance exercise), such as weight lifting, as part of your weekly exercise routine. Try to do these types of exercises for 30 minutes at least 3 days a week. Do not use any products that contain nicotine or tobacco. These products include cigarettes, chewing tobacco, and vaping devices, such as e-cigarettes. If you need help quitting, ask your health care provider. Control any long-term (chronic) conditions you have, such as high cholesterol or diabetes. Identify your sources of stress and find ways to manage stress. This may include meditation, deep breathing, or making time for fun activities. Alcohol use Do not drink alcohol if: Your health care provider tells you not to drink. You are pregnant, may be pregnant, or are planning to become pregnant. If you drink alcohol: Limit how much you have to: 0-1 drink a day for women. 0-2 drinks a day for men. Know how much alcohol is in your drink. In the [place] one drink equals one 12 oz bottle of beer (355 mL), one 5 oz glass of wine (148 mL), or one 1 oz glass of hard liquor (44 mL). Medicines Your health care provider may prescribe medicine if lifestyle changes are not enough to get your blood pressure under control and if: Your systolic blood pressure is 130 or higher. Your diastolic blood pressure is 80 or higher. Take medicines only as told by your health care provider. Follow the directions carefully. Blood pressure medicines must be taken as told by your health care provider. The medicine does not work as well when you skip doses. Skipping doses also puts you at risk for problems. Monitoring Before you monitor your blood pressure: Do not smoke, drink caffeinated beverages, or exercise within 30 minutes before taking a measurement. Use the bathroom and empty your bladder (urinate). Sit quietly for at least 5 minutes before taking measurements. Monitor your blood  pressure at home as told by your health care provider. To do this: Sit with your back straight and supported. Place your feet flat on the floor. Do not cross your legs. Support your arm on a flat surface, such as a table. Make sure your upper arm is at heart level. Each time you measure, take two or three readings one minute apart and record the results. You may also need to have your blood pressure checked regularly by your health care provider. General information Talk with your health care provider about your diet, exercise habits, and other lifestyle factors that may be contributing to hypertension. Review all the medicines you take with your health care provider because there may be side effects or interactions. Keep all follow-up visits. Your health care provider can help you create and adjust your plan for managing your high blood pressure. Where to find more information National Heart, Lung, and [employer] Institute: [employer] American Heart Association: www.heart.org Contact a health care provider if: You think you are having a reaction to medicines you have taken. You have repeated (recurrent) headaches. You feel dizzy. You have swelling in your ankles. You have trouble with your vision. Get help right away if: You develop a severe headache or confusion. You have unusual weakness or numbness, or you feel faint. You have severe pain in your chest or abdomen. You vomit repeatedly. You have  trouble breathing. These symptoms may be an emergency. Get help right away. Call 911. Do not wait to see if the symptoms will go away. Do not drive yourself to the hospital. Summary Hypertension is when the force of blood pumping through your arteries is too strong. If this condition is not controlled, it may put you at risk for serious complications. Your personal target blood pressure may vary depending on your medical conditions, your age, and other factors. For most people, a normal  blood pressure is less than 120/80. Hypertension is managed by lifestyle changes, medicines, or both. Lifestyle changes to help manage hypertension include losing weight, eating a healthy, low-sodium diet, exercising more, stopping smoking, and limiting alcohol. This information is not intended to replace advice given to you by your health care provider. Make sure you discuss any questions you have with your health care provider. Document Revised: 05/01/2021 Document Reviewed: 05/01/2021 [employer] Patient Education  2023 [employer].

## 2023-01-04 NOTE — Progress Notes (Signed)
Subjective:  Patient ID: Juan Nelson, male    DOB: 02/15/1966  Age: 57 y.o. MRN: [id number]  CC:  Chief Complaint  Patient presents with   Prediabetes   Hypertension    Pt states bp has been elevated     HPI Juan Nelson presents for   Prediabetes: Metformin 500mg  qd. No new side effects.  Home readings under 125.  Weight 237 in 07/2021, 240 in 05/2022.  Exercise - walking 5 times per week - 45-32min.  Avoiding soda, fast food.  Lab Results  Component Value Date   HGBA1C 5.2 06/26/2022   Wt Readings from Last 3 Encounters:  01/04/23 243 lb 12.8 oz (110.6 kg)  12/29/22 244 lb 6.4 oz (110.9 kg)  12/21/22 243 lb 3.2 oz (110.3 kg)   Hypertension: Lisinopril 40mg  qd. , few missed doses a few weeks ago. Usually daily. HA off meds. Consistent use recently. Saw nephrology recently - elevated readings. 137-145/90's. Osa on cpap consistently. Normal lipids in January at [facility].  BP Readings from Last 3 Encounters:  01/04/23 (!) 138/92  12/29/22 136/84  12/21/22 136/86   Lab Results  Component Value Date   CREATININE 0.91 06/26/2022    Gout: Last flare:none recent. Daily meds:allopurinol 100mg  qd.  Prn med: colchicine if needed.  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   Primary hypertension 12/25/2020   GERD with apnea 12/25/2020   Other seasonal allergic rhinitis 03/17/2017   Hx of laparoscopic adjustable gastric banding 02/08/2017   History of kidney stones 04/18/2014   Essential hypertension 04/18/2014   LFT elevation 10/08/2012   Hyperglycemia 10/08/2012   Past Medical History:  Diagnosis Date   Allergy    Arthritis    right knee   GERD (gastroesophageal reflux disease)    History of kidney stones    multople stones    Hypertension    Kidney stone    Sleep apnea    Urticaria    Past  Surgical History:  Procedure Laterality Date   EXTRACORPOREAL SHOCK WAVE LITHOTRIPSY Right 11/05/2016   Procedure: RIGHT EXTRACORPOREAL SHOCK WAVE LITHOTRIPSY (ESWL);  Surgeon: Malen Gauze, MD;  Location: [facility];  Service: Urology;  Laterality: Right;   GANGLION CYST EXCISION  1989   LAPAROSCOPIC GASTRIC BANDING     NEVUS EXCISION Right 04/28/2017   SPINE SURGERY     Allergies  Allergen Reactions   Other Anaphylaxis   Pumpkin Seed Hives and Swelling    Swelling of throat, tongue and lips. Swelling of throat, tongue and lips. Swelling of throat, tongue and lips.   Sesame Seed (Diagnostic) Anaphylaxis and Other (See Comments)   Bee Venom Swelling   Insect Extract Hives, Itching and Swelling   Mobic [Meloxicam] Nausea Only    "just didn't feel well"   Prior to Admission medications   Medication Sig Start Date End Date Taking? Authorizing Provider  acetaminophen (TYLENOL) 500 MG tablet Take 500 mg by mouth every 6 (six) hours as needed for headache.   Yes [provider]  allopurinol (ZYLOPRIM) 100 MG tablet Take 1 tablet (100 mg total) by mouth daily. 06/26/22  Yes Shade Flood, MD  aspirin EC 81 MG tablet Take 81 mg by mouth daily.   Yes [provider]  brompheniramine-pseudoephedrine-DM 30-2-10 MG/5ML syrup Take 5 mLs  by mouth 4 (four) times daily as needed. 08/08/22  Yes Claiborne Rigg, NP  cholecalciferol (VITAMIN D) 1000 UNITS tablet Take 5,000 Units by mouth daily.   Yes [provider]  colchicine 0.6 MG tablet Take two tabs once at the first sign of a gout flare and then one an hour later. Repeat in 24 hours if needed. Patient taking differently: Take 0.12 mg by mouth daily as needed (gout). Take two tabs once at the first sign of a gout flare and then one an hour later. Repeat in 24 hours if needed. 08/16/17  Yes Ofilia Neas, PA-C  cyclobenzaprine (FLEXERIL) 10 MG tablet TAKE 1 TABLET(S) EVERY 6-8 HOURS AS NEEDED FOR SPASM 06/09/22  Yes  [provider]  diclofenac (VOLTAREN) 75 MG EC tablet Take 1 tablet (75 mg total) by mouth 2 (two) times daily. As needed 01/29/21  Yes Shade Flood, MD  EPINEPHrine (AUVI-Q) 0.3 mg/0.3 mL IJ SOAJ injection Inject 0.3 mg into the muscle as needed for anaphylaxis. 12/29/22  Yes Alfonse Spruce, MD  fluticasone [facility]) 50 MCG/ACT nasal spray Place 2 sprays into both nostrils daily. 12/29/22  Yes Alfonse Spruce, MD  ipratropium (ATROVENT) 0.03 % nasal spray Place 2 sprays into both nostrils 3 (three) times daily. 12/29/22  Yes Alfonse Spruce, MD  lisinopril (ZESTRIL) 40 MG tablet Take 1 tablet (40 mg total) by mouth daily. 06/26/22  Yes Shade Flood, MD  loratadine (ALLERGY) 10 MG tablet Take 1 tablet (10 mg total) by mouth daily. Alegra D 12/29/22  Yes Alfonse Spruce, MD  Magnesium 100 MG CAPS Take by mouth.   Yes [provider]  metFORMIN (GLUCOPHAGE) 500 MG tablet Take 1 tablet (500 mg total) by mouth daily with breakfast. 11/05/22  Yes Shade Flood, MD  Multiple Vitamin (MULTIVITAMIN WITH MINERALS) TABS Take 1 tablet by mouth daily.   Yes [provider]  omalizumab [facility]) 150 MG injection Inject 300 mg into the skin every 28 (twenty-eight) days. 11/16/22  Yes Alfonse Spruce, MD  [facility] VERIO test strip CHECK BLOOD SUGAR UP TO ONCE PER DAY. 11/19/22  Yes Shade Flood, MD  tadalafil (CIALIS) 20 MG tablet Take 20 mg by mouth daily as needed for erectile dysfunction.  10/27/19  Yes [provider]  tamsulosin (FLOMAX) 0.4 MG CAPS capsule TAKE 1 CAP DAILY AS NEEDED FOR SYMPTOMS OF KIDNEY STONE 02/23/22  Yes Shade Flood, MD   Social History   Socioeconomic History   Marital status: Single    Spouse name: Not on file   Number of children: Not on file   Years of education: Not on file   Highest education level: Doctorate  Occupational History   Not on file  Tobacco Use   Smoking status: Never   Smokeless  tobacco: Never  Vaping Use   Vaping Use: Never used  Substance and Sexual Activity   Alcohol use: No   Drug use: No   Sexual activity: Yes  Other Topics Concern   Not on file  Social History Narrative   Lives alone   Caffeine- varies 0-1   Social Determinants of Health   Financial Resource Strain: Low Risk  (12/18/2022)   Overall Financial Resource Strain (CARDIA)    Difficulty of Paying Living Expenses: Not hard at all  Food Insecurity: No Food Insecurity (12/18/2022)   Hunger Vital Sign    Worried About Running Out of Food in the Last Year: Never true  Ran Out of Food in the Last Year: Never true  Transportation Needs: No Transportation Needs (12/18/2022)   PRAPARE - Administrator, Civil Service (Medical): No    Lack of Transportation (Non-Medical): No  Physical Activity: Sufficiently Active (12/18/2022)   Exercise Vital Sign    Days of Exercise per Week: 5 days    Minutes of Exercise per Session: 50 min  Stress: No Stress Concern Present (12/18/2022)   [employer] of Occupational Health - Occupational Stress Questionnaire    Feeling of Stress : Only a little  Social Connections: Moderately Integrated (12/18/2022)   Social Connection and Isolation Panel [NHANES]    Frequency of Communication with Friends and Family: More than three times a week    Frequency of Social Gatherings with Friends and Family: Three times a week    Attends Religious Services: More than 4 times per year    Active Member of Clubs or Organizations: Yes    Attends Banker Meetings: More than 4 times per year    Marital Status: Never married  Intimate Partner Violence: Not on file    Review of Systems  Constitutional:  Negative for fatigue and unexpected weight change.  Eyes:  Negative for visual disturbance.  Respiratory:  Negative for cough, chest tightness and shortness of breath.   Cardiovascular:  Negative for chest pain, palpitations and leg swelling.   Gastrointestinal:  Negative for abdominal pain and blood in stool.  Neurological:  Negative for dizziness, light-headedness and headaches.     Objective:   Vitals:   01/04/23 0949 01/04/23 1014  BP: (!) 142/92 (!) 138/92  Pulse: 70   Temp: 98.3 F (36.8 C)   TempSrc: Temporal   SpO2: 95%   Weight: 243 lb 12.8 oz (110.6 kg)   Height: 6' (1.829 m)      Physical Exam Vitals reviewed.  Constitutional:      Appearance: He is well-developed.  HENT:     Head: Normocephalic and atraumatic.  Neck:     Vascular: No carotid bruit or JVD.  Cardiovascular:     Rate and Rhythm: Normal rate and regular rhythm.     Heart sounds: Normal heart sounds. No murmur heard. Pulmonary:     Effort: Pulmonary effort is normal.     Breath sounds: Normal breath sounds. No rales.  Musculoskeletal:     Right lower leg: No edema.     Left lower leg: No edema.  Skin:    General: Skin is warm and dry.  Neurological:     Mental Status: He is alert and oriented to person, place, and time.  Psychiatric:        Mood and Affect: Mood normal.        Assessment & Plan:  OSHA KEMPNER Nelson is a 57 y.o. male . Essential hypertension - Plan: Comprehensive metabolic panel, amLODipine (NORVASC) 2.5 MG tablet, lisinopril (ZESTRIL) 40 MG tablet  -Decreased control.  Will add low-dose amlodipine with potential side effects discussed, continue lisinopril 40 mg daily, check labs, handout given on management of hypertension with RTC precautions.  Prediabetes - Plan: Hemoglobin A1c, metFORMIN (GLUCOPHAGE) 500 MG tablet  -Check updated A1c with adjustment of plan accordingly, continue metformin same dose for now.  History of gout - Plan: allopurinol (ZYLOPRIM) 100 MG tablet  No Recent flare, continue allopurinol.  History of nephrolithiasis  -Flomax if needed.  RTC/ER precautions  Meds ordered this encounter  Medications   amLODipine (NORVASC) 2.5 MG tablet  Sig: Take 1 tablet (2.5 mg total) by  mouth daily.    Dispense:  90 tablet    Refill:  1   allopurinol (ZYLOPRIM) 100 MG tablet    Sig: Take 1 tablet (100 mg total) by mouth daily.    Dispense:  30 tablet    Refill:  5   lisinopril (ZESTRIL) 40 MG tablet    Sig: Take 1 tablet (40 mg total) by mouth daily.    Dispense:  30 tablet    Refill:  5   metFORMIN (GLUCOPHAGE) 500 MG tablet    Sig: Take 1 tablet (500 mg total) by mouth daily with breakfast.    Dispense:  30 tablet    Refill:  3   Patient Instructions  Thank you for coming in today.  Lets try adding low-dose amlodipine once per day, continue same dose lisinopril.  See information below on blood pressure.  If readings continue to remain elevated, let me know and we can make further changes.  Keep up the good work with exercise, watching diet, I will check prediabetes test today.  No change in metformin for now and continue allopurinol for gout prevention. Take care!  Managing Your Hypertension Hypertension, also called high blood pressure, is when the force of the blood pressing against the walls of the arteries is too strong. Arteries are blood vessels that carry blood from your heart throughout your body. Hypertension forces the heart to work harder to pump blood and may cause the arteries to become narrow or stiff. Understanding blood pressure readings A blood pressure reading includes a higher number over a lower number: The first, or top, number is called the systolic pressure. It is a measure of the pressure in your arteries as your heart beats. The second, or bottom number, is called the diastolic pressure. It is a measure of the pressure in your arteries as the heart relaxes. For most people, a normal blood pressure is below 120/80. Your personal target blood pressure may vary depending on your medical conditions, your age, and other factors. Blood pressure is classified into four stages. Based on your blood pressure reading, your health care provider may use  the following stages to determine what type of treatment you need, if any. Systolic pressure and diastolic pressure are measured in a unit called millimeters of mercury (mmHg). Normal Systolic pressure: below 120. Diastolic pressure: below 80. Elevated Systolic pressure: 120-129. Diastolic pressure: below 80. Hypertension stage 1 Systolic pressure: 130-139. Diastolic pressure: 80-89. Hypertension stage 2 Systolic pressure: 140 or above. Diastolic pressure: 90 or above. How can this condition affect me? Managing your hypertension is very important. Over time, hypertension can damage the arteries and decrease blood flow to parts of the body, including the brain, heart, and kidneys. Having untreated or uncontrolled hypertension can lead to: A heart attack. A stroke. A weakened blood vessel (aneurysm). Heart failure. Kidney damage. Eye damage. Memory and concentration problems. Vascular dementia. What actions can I take to manage this condition? Hypertension can be managed by making lifestyle changes and possibly by taking medicines. Your health care provider will help you make a plan to bring your blood pressure within a normal range. You may be referred for counseling on a healthy diet and physical activity. Nutrition  Eat a diet that is high in fiber and potassium, and low in salt (sodium), added sugar, and fat. An example eating plan is called the DASH diet. DASH stands for Dietary Approaches to Stop Hypertension. To eat this  way: Eat plenty of fresh fruits and vegetables. Try to fill one-half of your plate at each meal with fruits and vegetables. Eat whole grains, such as whole-wheat pasta, brown rice, or whole-grain bread. Fill about one-fourth of your plate with whole grains. Eat low-fat dairy products. Avoid fatty cuts of meat, processed or cured meats, and poultry with skin. Fill about one-fourth of your plate with lean proteins such as fish, chicken without skin, beans, eggs, and  tofu. Avoid pre-made and processed foods. These tend to be higher in sodium, added sugar, and fat. Reduce your daily sodium intake. Many people with hypertension should eat less than 1,500 mg of sodium a day. Lifestyle  Work with your health care provider to maintain a healthy body weight or to lose weight. Ask what an ideal weight is for you. Get at least 30 minutes of exercise that causes your heart to beat faster (aerobic exercise) most days of the week. Activities may include walking, swimming, or biking. Include exercise to strengthen your muscles (resistance exercise), such as weight lifting, as part of your weekly exercise routine. Try to do these types of exercises for 30 minutes at least 3 days a week. Do not use any products that contain nicotine or tobacco. These products include cigarettes, chewing tobacco, and vaping devices, such as e-cigarettes. If you need help quitting, ask your health care provider. Control any long-term (chronic) conditions you have, such as high cholesterol or diabetes. Identify your sources of stress and find ways to manage stress. This may include meditation, deep breathing, or making time for fun activities. Alcohol use Do not drink alcohol if: Your health care provider tells you not to drink. You are pregnant, may be pregnant, or are planning to become pregnant. If you drink alcohol: Limit how much you have to: 0-1 drink a day for women. 0-2 drinks a day for men. Know how much alcohol is in your drink. In the [place] one drink equals one 12 oz bottle of beer (355 mL), one 5 oz glass of wine (148 mL), or one 1 oz glass of hard liquor (44 mL). Medicines Your health care provider may prescribe medicine if lifestyle changes are not enough to get your blood pressure under control and if: Your systolic blood pressure is 130 or higher. Your diastolic blood pressure is 80 or higher. Take medicines only as told by your health care provider. Follow the directions  carefully. Blood pressure medicines must be taken as told by your health care provider. The medicine does not work as well when you skip doses. Skipping doses also puts you at risk for problems. Monitoring Before you monitor your blood pressure: Do not smoke, drink caffeinated beverages, or exercise within 30 minutes before taking a measurement. Use the bathroom and empty your bladder (urinate). Sit quietly for at least 5 minutes before taking measurements. Monitor your blood pressure at home as told by your health care provider. To do this: Sit with your back straight and supported. Place your feet flat on the floor. Do not cross your legs. Support your arm on a flat surface, such as a table. Make sure your upper arm is at heart level. Each time you measure, take two or three readings one minute apart and record the results. You may also need to have your blood pressure checked regularly by your health care provider. General information Talk with your health care provider about your diet, exercise habits, and other lifestyle factors that may be contributing to hypertension. Review  all the medicines you take with your health care provider because there may be side effects or interactions. Keep all follow-up visits. Your health care provider can help you create and adjust your plan for managing your high blood pressure. Where to find more information National Heart, Lung, and Blood Institute: PopSteam.is American Heart Association: www.heart.org Contact a health care provider if: You think you are having a reaction to medicines you have taken. You have repeated (recurrent) headaches. You feel dizzy. You have swelling in your ankles. You have trouble with your vision. Get help right away if: You develop a severe headache or confusion. You have unusual weakness or numbness, or you feel faint. You have severe pain in your chest or abdomen. You vomit repeatedly. You have trouble  breathing. These symptoms may be an emergency. Get help right away. Call 911. Do not wait to see if the symptoms will go away. Do not drive yourself to the hospital. Summary Hypertension is when the force of blood pumping through your arteries is too strong. If this condition is not controlled, it may put you at risk for serious complications. Your personal target blood pressure may vary depending on your medical conditions, your age, and other factors. For most people, a normal blood pressure is less than 120/80. Hypertension is managed by lifestyle changes, medicines, or both. Lifestyle changes to help manage hypertension include losing weight, eating a healthy, low-sodium diet, exercising more, stopping smoking, and limiting alcohol. This information is not intended to replace advice given to you by your health care provider. Make sure you discuss any questions you have with your health care provider. Document Revised: 05/01/2021 Document Reviewed: 05/01/2021 Elsevier Patient Education  2023 Else[employer] Inc.     Signed,   Meredith Staggers, MD [facility], [facility] 01/04/23 10:42 AM

## 2023-01-11 ENCOUNTER — Encounter: Payer: Self-pay | Admitting: Diagnostic Neuroimaging

## 2023-01-11 ENCOUNTER — Ambulatory Visit: Payer: BC Managed Care – PPO | Admitting: Diagnostic Neuroimaging

## 2023-01-11 VITALS — BP 133/84 | HR 82 | Ht 73.0 in | Wt 243.4 lb

## 2023-01-11 DIAGNOSIS — M62561 Muscle wasting and atrophy, not elsewhere classified, right lower leg: Secondary | ICD-10-CM

## 2023-01-11 NOTE — Progress Notes (Addendum)
[facility]  PATIENT: Juan Nelson DOB: 10/22/1965  REFERRING CLINICIAN: Herma Carson HISTORY FROM: patient  REASON FOR VISIT: new consult    HISTORICAL  CHIEF COMPLAINT:  Chief Complaint  Patient presents with   New Patient (Initial Visit)    Pt in room 7, new patient internal referral for muscle atrophy. Right calf muscle atrophy, pt said balance if slightly off. Slight middle back pain  3 weeks now, getting up he notices a pull sensation at times. No tingling or numbness.     HISTORY OF PRESENT ILLNESS:   UPDATE (01/11/23, VRP): Since last visit, doing about the same until Fall 2023; then noticed worsening right calf strength and more right calf atrophy. Similar painless atrophy and weakness compared to 2010 (when he had MRI lumbar spine and then referred to Dr Jeral Fruit for surgical treatment of right lumbar radiculopathy, which helped relieve symptoms). No back pain. No issues with left leg. No issues with arms.   PRIOR HPI (01/18/19): 57 year old male here for evaluation of headache, left arm weakness, speech difficulty.  March 2020 patient was under extreme stress, with lack of sleep and insomnia.  Around that time he also noted left arm weakness and aching pain proximally, mainly when he would lay down to go to sleep.  Symptoms does wake him up at night.  During the day he did not seem to notice any weakness in his arm.  No problem with his left hand or fingers.  Patient is a Dance movement psychotherapist and had no problems playing piano during this time.  He did have some left flank pain around the same time and was diagnosed with kidney stones.  Around this time patient is also having right-sided headache associated with it, nausea, photophobia.  He would take [facility] powder with good relief.  Around this time patient also had several episodes of speech and language difficulty where he had to repeat himself on the phone because his words do not come out correctly.  In the past 1  month symptoms have essentially resolved.  No prior history of migraine headaches.  I previously saw patient 10 years ago for right leg muscle weakness and wasting, diagnosed and treated for lumbar spine disease.   REVIEW OF SYSTEMS: Full 14 system review of systems performed and negative with exception of: As per HPI.  ALLERGIES: Allergies  Allergen Reactions   Other Anaphylaxis   Pumpkin Seed Hives and Swelling    Swelling of throat, tongue and lips. Swelling of throat, tongue and lips. Swelling of throat, tongue and lips.   Sesame Seed (Diagnostic) Anaphylaxis and Other (See Comments)   Bee Venom Swelling   Insect Extract Hives, Itching and Swelling   Mobic [Meloxicam] Nausea Only    "just didn't feel well"    HOME MEDICATIONS: Outpatient Medications Prior to Visit  Medication Sig Dispense Refill   acetaminophen (TYLENOL) 500 MG tablet Take 500 mg by mouth every 6 (six) hours as needed for headache.     allopurinol (ZYLOPRIM) 100 MG tablet Take 1 tablet (100 mg total) by mouth daily. 30 tablet 5   amLODipine (NORVASC) 2.5 MG tablet Take 1 tablet (2.5 mg total) by mouth daily. 90 tablet 1   aspirin EC 81 MG tablet Take 81 mg by mouth daily.     brompheniramine-pseudoephedrine-DM 30-2-10 MG/5ML syrup Take 5 mLs by mouth 4 (four) times daily as needed. 240 mL 0   cholecalciferol (VITAMIN D) 1000 UNITS tablet Take 5,000 Units by mouth daily.  colchicine 0.6 MG tablet Take two tabs once at the first sign of a gout flare and then one an hour later. Repeat in 24 hours if needed. (Patient taking differently: Take 0.12 mg by mouth daily as needed (gout). Take two tabs once at the first sign of a gout flare and then one an hour later. Repeat in 24 hours if needed.) 12 tablet 1   cyclobenzaprine (FLEXERIL) 10 MG tablet TAKE 1 TABLET(S) EVERY 6-8 HOURS AS NEEDED FOR SPASM     diclofenac (VOLTAREN) 75 MG EC tablet Take 1 tablet (75 mg total) by mouth 2 (two) times daily. As needed 180  tablet 0   EPINEPHrine (AUVI-Q) 0.3 mg/0.3 mL IJ SOAJ injection Inject 0.3 mg into the muscle as needed for anaphylaxis. 2 each 2   fluticasone (FLONASE) 50 MCG/ACT nasal spray Place 2 sprays into both nostrils daily. 16 g 5   ipratropium (ATROVENT) 0.03 % nasal spray Place 2 sprays into both nostrils 3 (three) times daily. 30 mL 12   lisinopril (ZESTRIL) 40 MG tablet Take 1 tablet (40 mg total) by mouth daily. 30 tablet 5   loratadine (ALLERGY) 10 MG tablet Take 1 tablet (10 mg total) by mouth daily. Alegra D 90 tablet 1   Magnesium 100 MG CAPS Take by mouth.     metFORMIN (GLUCOPHAGE) 500 MG tablet Take 1 tablet (500 mg total) by mouth daily with breakfast. 30 tablet 3   Multiple Vitamin (MULTIVITAMIN WITH MINERALS) TABS Take 1 tablet by mouth daily.     omalizumab (XOLAIR) 150 MG injection Inject 300 mg into the skin every 28 (twenty-eight) days. 2 each 11   ONETOUCH VERIO test strip CHECK BLOOD SUGAR UP TO ONCE PER DAY. 50 strip 1   tadalafil (CIALIS) 20 MG tablet Take 20 mg by mouth daily as needed for erectile dysfunction.      tamsulosin (FLOMAX) 0.4 MG CAPS capsule TAKE 1 CAP DAILY AS NEEDED FOR SYMPTOMS OF KIDNEY STONE 30 capsule 3   Facility-Administered Medications Prior to Visit  Medication Dose Route Frequency Provider Last Rate Last Admin   omalizumab Geoffry Paradise) injection 300 mg  300 mg Subcutaneous Q28 days Marcelyn Bruins, MD   300 mg at 12/15/22 1610    PAST MEDICAL HISTORY: Past Medical History:  Diagnosis Date   Allergy    Arthritis    right knee   GERD (gastroesophageal reflux disease)    History of kidney stones    multople stones    Hypertension    Kidney stone    Sleep apnea    Urticaria     PAST SURGICAL HISTORY: Past Surgical History:  Procedure Laterality Date   EXTRACORPOREAL SHOCK WAVE LITHOTRIPSY Right 11/05/2016   Procedure: RIGHT EXTRACORPOREAL SHOCK WAVE LITHOTRIPSY (ESWL);  Surgeon: Malen Gauze, MD;  Location: [facility];  Service:  Urology;  Laterality: Right;   GANGLION CYST EXCISION  1989   LAPAROSCOPIC GASTRIC BANDING     NEVUS EXCISION Right 04/28/2017   SPINE SURGERY      FAMILY HISTORY: Family History  Problem Relation Age of Onset   Hypertension Mother    Diabetes Father    Cancer Maternal Grandmother    Depression Maternal Grandfather    Alzheimer's disease Paternal Grandmother    Cancer Paternal Grandfather     SOCIAL HISTORY: Social History   Socioeconomic History   Marital status: Single    Spouse name: Not on file   Number of children: Not on file   Years  of education: Not on file   Highest education level: Doctorate  Occupational History   Not on file  Tobacco Use   Smoking status: Never   Smokeless tobacco: Never  Vaping Use   Vaping Use: Never used  Substance and Sexual Activity   Alcohol use: No   Drug use: No   Sexual activity: Yes  Other Topics Concern   Not on file  Social History Narrative   Lives alone   Caffeine- varies 0-1   Social Determinants of Health   Financial Resource Strain: Low Risk  (12/18/2022)   Overall Financial Resource Strain (CARDIA)    Difficulty of Paying Living Expenses: Not hard at all  Food Insecurity: No Food Insecurity (12/18/2022)   Hunger Vital Sign    Worried About Running Out of Food in the Last Year: Never true    Ran Out of Food in the Last Year: Never true  Transportation Needs: No Transportation Needs (12/18/2022)   PRAPARE - Administrator, Civil Service (Medical): No    Lack of Transportation (Non-Medical): No  Physical Activity: Sufficiently Active (12/18/2022)   Exercise Vital Sign    Days of Exercise per Week: 5 days    Minutes of Exercise per Session: 50 min  Stress: No Stress Concern Present (12/18/2022)   [employer] of Occupational Health - Occupational Stress Questionnaire    Feeling of Stress : Only a little  Social Connections: Moderately Integrated (12/18/2022)   Social Connection and Isolation Panel  [NHANES]    Frequency of Communication with Friends and Family: More than three times a week    Frequency of Social Gatherings with Friends and Family: Three times a week    Attends Religious Services: More than 4 times per year    Active Member of Clubs or Organizations: Yes    Attends Engineer, structural: More than 4 times per year    Marital Status: Never married  Intimate Partner Violence: Not on file     PHYSICAL EXAM  VIDEO EXAM  GENERAL EXAM/CONSTITUTIONAL: Vitals:  Vitals:   01/11/23 1040  BP: 133/84  Pulse: 82  Weight: 243 lb 6.4 oz (110.4 kg)  Height: 6\' 1"  (1.854 m)   Body mass index is 32.11 kg/m. Wt Readings from Last 3 Encounters:  01/11/23 243 lb 6.4 oz (110.4 kg)  01/04/23 243 lb 12.8 oz (110.6 kg)  12/29/22 244 lb 6.4 oz (110.9 kg)   Patient is in no distress; well developed, nourished and groomed; neck is supple   NEUROLOGIC: MENTAL STATUS:      No data to display         awake, alert, oriented to person, place and time recent and remote memory intact normal attention and concentration language fluent, comprehension intact, naming intact fund of knowledge appropriate  CRANIAL NERVE:  2nd, 3rd, 4th, 6th - visual fields full to confrontation, extraocular muscles intact, no nystagmus 5th - facial sensation symmetric 7th - facial strength symmetric 8th - hearing intact 11th - shoulder shrug symmetric 12th - tongue protrusion midline  MOTOR:  NO TREMOR; NO DRIFT IN BUE  SENSORY:  normal and symmetric to light touch  COORDINATION:  fine finger movements normal    DIAGNOSTIC DATA (LABS, IMAGING, TESTING) - I reviewed patient records, labs, notes, testing and imaging myself where available.  Lab Results  Component Value Date   WBC 8.3 05/18/2020   HGB 13.3 05/18/2020   HCT 38.0 (L) 05/18/2020   MCV 89.4 05/18/2020  PLT 158 05/18/2020      Component Value Date/Time   NA 140 01/04/2023 1052   NA 140 07/05/2020 1619    K 4.0 01/04/2023 1052   CL 105 01/04/2023 1052   CO2 27 01/04/2023 1052   GLUCOSE 105 (H) 01/04/2023 1052   BUN 14 01/04/2023 1052   BUN 12 07/05/2020 1619   CREATININE 0.89 01/04/2023 1052   CREATININE 1.05 01/24/2016 1342   CALCIUM 8.8 01/04/2023 1052   PROT 6.7 01/04/2023 1052   PROT 6.5 07/05/2020 1619   ALBUMIN 4.2 01/04/2023 1052   ALBUMIN 4.5 07/05/2020 1619   AST 30 01/04/2023 1052   ALT 40 01/04/2023 1052   ALKPHOS 79 01/04/2023 1052   BILITOT 0.6 01/04/2023 1052   BILITOT 0.4 07/05/2020 1619   GFRNONAA 92 07/05/2020 1619   GFRNONAA 82 01/24/2016 1342   GFRAA 106 07/05/2020 1619   GFRAA >89 01/24/2016 1342   Lab Results  Component Value Date   CHOL 132 07/05/2019   HDL 31 (L) 07/05/2019   LDLCALC 76 07/05/2019   TRIG 144 07/05/2019   CHOLHDL 4.3 07/05/2019   Lab Results  Component Value Date   HGBA1C 5.7 01/04/2023   Lab Results  Component Value Date   VITAMINB12 >2000 (H) 06/27/2018   Lab Results  Component Value Date   TSH 1.470 07/05/2019    09/11/09 MRI lumbar spine -At L5-S1 posterior central disc protrusion with potential impingement upon the bilateral descending S1 nerve roots -At L4-5 disc bulging and facet atrophy with potential impingement upon descending left L5 nerve.  01/04/19 CT renal [I reviewed images myself and agree with interpretation. -VRP]  1. There are multiple small and punctuate nonobstructive calculi present bilaterally, the largest calculus measuring 3 mm in the inferior pole of the left kidney. No evidence of ureteral calculus or hydronephrosis. 2.  Diverticulosis without evidence of acute diverticulitis.  01/31/19 MRI brain - Normal MRI brain (with and without).    ASSESSMENT AND PLAN  57 y.o. year old male here with:  Dx:  1. Right calf atrophy     PLAN:  RIGHT LEG / CALF ATROPHY AND WEAKNESS (painless; since Fall 2023) - check MRI lumbar spine (similar symptoms to 2010, when he was found to have right lumbar  radiculopathy, s/p surgery) - start strength training exercises; consider PT evaluation  Orders Placed This Encounter  Procedures   MR Lumbar Spine W Wo Contrast   Return for pending if symptoms worsen or fail to improve, pending test results.    Suanne Marker, MD 01/11/2023, 11:35 AM Certified in Neurology, Neurophysiology and Neuroimaging  [facility] [address] [place], [place] [address] [phone]

## 2023-01-16 ENCOUNTER — Other Ambulatory Visit: Payer: Self-pay | Admitting: Family Medicine

## 2023-01-16 DIAGNOSIS — R7303 Prediabetes: Secondary | ICD-10-CM

## 2023-01-19 ENCOUNTER — Ambulatory Visit (INDEPENDENT_AMBULATORY_CARE_PROVIDER_SITE_OTHER): Payer: BC Managed Care – PPO

## 2023-01-19 DIAGNOSIS — M62561 Muscle wasting and atrophy, not elsewhere classified, right lower leg: Secondary | ICD-10-CM | POA: Diagnosis not present

## 2023-01-19 MED ORDER — GADOBENATE DIMEGLUMINE 529 MG/ML IV SOLN
20.0000 mL | Freq: Once | INTRAVENOUS | Status: AC | PRN
Start: 2023-01-19 — End: 2023-01-19
  Administered 2023-01-19: 20 mL via INTRAVENOUS

## 2023-01-26 ENCOUNTER — Ambulatory Visit (INDEPENDENT_AMBULATORY_CARE_PROVIDER_SITE_OTHER): Payer: BC Managed Care – PPO | Admitting: *Deleted

## 2023-01-26 DIAGNOSIS — L501 Idiopathic urticaria: Secondary | ICD-10-CM

## 2023-02-03 DIAGNOSIS — G4733 Obstructive sleep apnea (adult) (pediatric): Secondary | ICD-10-CM | POA: Diagnosis not present

## 2023-02-08 DIAGNOSIS — M549 Dorsalgia, unspecified: Secondary | ICD-10-CM | POA: Diagnosis not present

## 2023-02-08 DIAGNOSIS — R310 Gross hematuria: Secondary | ICD-10-CM | POA: Diagnosis not present

## 2023-02-08 DIAGNOSIS — N281 Cyst of kidney, acquired: Secondary | ICD-10-CM | POA: Diagnosis not present

## 2023-02-12 ENCOUNTER — Ambulatory Visit
Admission: EM | Admit: 2023-02-12 | Discharge: 2023-02-12 | Disposition: A | Discharge status: Home / Self Care | Payer: BC Managed Care – PPO

## 2023-02-12 DIAGNOSIS — Z23 Encounter for immunization: Secondary | ICD-10-CM

## 2023-02-12 DIAGNOSIS — S61210A Laceration without foreign body of right index finger without damage to nail, initial encounter: Secondary | ICD-10-CM | POA: Diagnosis not present

## 2023-02-12 MED ORDER — TETANUS-DIPHTH-ACELL PERTUSSIS 5-2.5-18.5 LF-MCG/0.5 IM SUSY
0.5000 mL | PREFILLED_SYRINGE | Freq: Once | INTRAMUSCULAR | Status: AC
  Administered 2023-02-12: 0.5 mL via INTRAMUSCULAR

## 2023-02-12 NOTE — Discharge Instructions (Signed)
Monitor for signs of infection that include increased redness, swelling, pus and follow-up sooner if this occurs.  Tetanus vaccine was updated today.

## 2023-02-12 NOTE — ED Provider Notes (Signed)
[name]    CSN: [id number] Arrival date & time: 02/12/23  1923      History   Chief Complaint Chief Complaint  Patient presents with   Laceration    HPI Juan Nelson is a 57 y.o. male.   Patient presents with laceration to right index finger that occurred approximately 30 minutes prior to arrival to urgent care.  Patient reports that he was cooking dinner and cut it on a tin can.  Patient reports last tetanus vaccine was in 2018.  He does not take any blood thinning medications.  Reports that he washed it with water and hydrogen peroxide prior to arrival.   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   Primary hypertension 12/25/2020   GERD with apnea 12/25/2020   Other seasonal allergic rhinitis 03/17/2017   Hx of laparoscopic adjustable gastric banding 02/08/2017   History of kidney stones 04/18/2014   Essential hypertension 04/18/2014   LFT elevation 10/08/2012   Hyperglycemia 10/08/2012    Past Surgical History:  Procedure Laterality Date   EXTRACORPOREAL SHOCK WAVE LITHOTRIPSY Right 11/05/2016   Procedure: RIGHT EXTRACORPOREAL SHOCK WAVE LITHOTRIPSY (ESWL);  Surgeon: Malen Gauze, MD;  Location: [facility];  Service: Urology;  Laterality: Right;   GANGLION CYST EXCISION  1989   LAPAROSCOPIC GASTRIC BANDING     NEVUS EXCISION Right 04/28/2017   SPINE SURGERY         Home Medications    Prior to Admission medications   Medication Sig Start Date End Date Taking? Authorizing Provider  aspirin EC 81 MG tablet Take 81 mg by mouth daily. Swallow whole.   Yes [provider]  acetaminophen (TYLENOL) 500 MG tablet  Take 500 mg by mouth every 6 (six) hours as needed for headache.    [provider]  allopurinol (ZYLOPRIM) 100 MG tablet Take 1 tablet (100 mg total) by mouth daily. 01/04/23   Shade Flood, MD  amLODipine (NORVASC) 2.5 MG tablet Take 1 tablet (2.5 mg total) by mouth daily. 01/04/23   Shade Flood, MD  aspirin EC 81 MG tablet Take 81 mg by mouth daily.    [provider]  brompheniramine-pseudoephedrine-DM 30-2-10 MG/5ML syrup Take 5 mLs by mouth 4 (four) times daily as needed. 08/08/22   Claiborne Rigg, NP  cholecalciferol (VITAMIN D) 1000 UNITS tablet Take 5,000 Units by mouth daily.    [provider]  colchicine 0.6 MG tablet Take two tabs once at the first sign of a gout flare and then one an hour later. Repeat in 24 hours if needed. Patient taking differently: Take 0.12 mg by mouth daily as needed (gout). Take two tabs once at the first sign of a gout flare and then one an hour later. Repeat in 24 hours if needed. 08/16/17   Ofilia Neas, PA-C  cyclobenzaprine (FLEXERIL) 10 MG tablet TAKE 1 TABLET(S) EVERY 6-8 HOURS AS NEEDED FOR SPASM 06/09/22   [provider]  diclofenac (VOLTAREN) 75 MG EC tablet Take 1 tablet (75 mg total) by  mouth 2 (two) times daily. As needed 01/29/21   Shade Flood, MD  EPINEPHrine (AUVI-Q) 0.3 mg/0.3 mL IJ SOAJ injection Inject 0.3 mg into the muscle as needed for anaphylaxis. 12/29/22   Alfonse Spruce, MD  fluticasone [facility]) 50 MCG/ACT nasal spray Place 2 sprays into both nostrils daily. 12/29/22   Alfonse Spruce, MD  ipratropium (ATROVENT) 0.03 % nasal spray Place 2 sprays into both nostrils 3 (three) times daily. 12/29/22   Alfonse Spruce, MD  lisinopril (ZESTRIL) 40 MG tablet Take 1 tablet (40 mg total) by mouth daily. 01/04/23   Shade Flood, MD  loratadine (ALLERGY) 10 MG tablet Take 1 tablet (10 mg total) by mouth daily. Alegra D 12/29/22   Alfonse Spruce, MD  Magnesium 100 MG CAPS  Take by mouth.    [provider]  metFORMIN (GLUCOPHAGE) 500 MG tablet Take 1 tablet (500 mg total) by mouth daily with breakfast. 01/04/23   Shade Flood, MD  Multiple Vitamin (MULTIVITAMIN WITH MINERALS) TABS Take 1 tablet by mouth daily.    [provider]  omalizumab Geoffry Paradise) 150 MG injection Inject 300 mg into the skin every 28 (twenty-eight) days. 11/16/22   Alfonse Spruce, MD  [facility] VERIO test strip CHECK BLOOD SUGAR UP TO ONCE PER DAY. 01/18/23   Shade Flood, MD  tadalafil (CIALIS) 20 MG tablet Take 20 mg by mouth daily as needed for erectile dysfunction.  10/27/19   [provider]  tamsulosin (FLOMAX) 0.4 MG CAPS capsule TAKE 1 CAP DAILY AS NEEDED FOR SYMPTOMS OF KIDNEY STONE 02/23/22   Shade Flood, MD    Family History Family History  Problem Relation Age of Onset   Hypertension Mother    Diabetes Father    Cancer Maternal Grandmother    Depression Maternal Grandfather    Alzheimer's disease Paternal Grandmother    Cancer Paternal Grandfather     Social History Social History   Tobacco Use   Smoking status: Never   Smokeless tobacco: Never  Vaping Use   Vaping Use: Never used  Substance Use Topics   Alcohol use: No   Drug use: No     Allergies   Other, Pumpkin seed, Sesame seed (diagnostic), Bee venom, Insect extract, and Mobic [meloxicam]   Review of Systems Review of Systems Per HPI  Physical Exam Triage Vital Signs ED Triage Vitals  Enc Vitals Group     BP 02/12/23 1926 (!) 142/82     Pulse Rate 02/12/23 1926 91     Resp 02/12/23 1926 16     Temp 02/12/23 1926 98.2 F (36.8 C)     Temp Source 02/12/23 1926 Oral     SpO2 02/12/23 1926 96 %     Weight --      Height --      Head Circumference --      Peak Flow --      Pain Score 02/12/23 1928 2     Pain Loc --      Pain Edu? --      Excl. in GC? --    No data found.  Updated Vital Signs BP (!) 142/82 (BP Location: Left Arm)   Pulse 91    Temp 98.2 F (36.8 C) (Oral)   Resp 16   SpO2 96%   Visual Acuity Right Eye Distance:   Left Eye Distance:   Bilateral Distance:    Right Eye Near:   Left Eye Near:  Bilateral Near:     Physical Exam Constitutional:      General: He is not in acute distress.    Appearance: Normal appearance. He is not toxic-appearing or diaphoretic.  HENT:     Head: Normocephalic and atraumatic.  Eyes:     Extraocular Movements: Extraocular movements intact.     Conjunctiva/sclera: Conjunctivae normal.  Pulmonary:     Effort: Pulmonary effort is normal.  Skin:    Comments: Approximately 3/4 inch very superficial linear laceration present to medial portion of distal right index finger. Nail is intact. Neurovascularly intact. Bleeding controlled.    Neurological:     General: No focal deficit present.     Mental Status: He is alert and oriented to person, place, and time. Mental status is at baseline.  Psychiatric:        Mood and Affect: Mood normal.        Behavior: Behavior normal.        Thought Content: Thought content normal.        Judgment: Judgment normal.      UC Treatments / Results  Labs (all labs ordered are listed, but only abnormal results are displayed) Labs Reviewed - No data to display  EKG   Radiology No results found.  Procedures Laceration Repair  Date/Time: 02/12/2023 7:50 PM  Performed by: Gustavus Bryant, FNP Authorized by: Gustavus Bryant, FNP   Consent:    Consent obtained:  Verbal   Consent given by:  Patient   Risks discussed:  Infection and pain   Alternatives discussed:  No treatment and delayed treatment Universal protocol:    Procedure explained and questions answered to patient or proxy's satisfaction: yes     Immediately prior to procedure, a time out was called: yes     Patient identity confirmed:  Verbally with patient and arm band Anesthesia:    Anesthesia method:  None Exploration:    Imaging outcome: foreign body not noted      Wound exploration: wound explored through full range of motion and entire depth of wound visualized     Contaminated: no   Treatment:    Wound cleansed with: antiseptic cleanser. Skin repair:    Repair method:  Tissue adhesive (dermabond) Approximation:    Approximation:  Close Repair type:    Repair type:  Simple Post-procedure details:    Dressing:  Non-adherent dressing   Procedure completion:  Tolerated well, no immediate complications  (including critical care time)  Medications Ordered in UC Medications  Tdap (BOOSTRIX) injection 0.5 mL (0.5 mLs Intramuscular Given 02/12/23 1941)    Initial Impression / Assessment and Plan / UC Course  I have reviewed the triage vital signs and the nursing notes.  Pertinent labs & imaging results that were available during my care of the patient were reviewed by me and considered in my medical decision making (see chart for details).     Dermabond was used to apply to laceration with close approximation of wound edges.  Advised to monitor for signs of infection and follow-up if these occur.  Tetanus vaccine updated today.  Advised strict return precautions.  Patient verbalized understanding and was agreeable with plan. Final Clinical Impressions(s) / UC Diagnoses   Final diagnoses:  Laceration of right index finger without foreign body without damage to nail, initial encounter     Discharge Instructions      Monitor for signs of infection that include increased redness, swelling, pus and follow-up sooner if this occurs.  Tetanus vaccine was updated today.    ED Prescriptions   None    PDMP not reviewed this encounter.   Gustavus Bryant, [place] 02/12/23 [phone]

## 2023-02-12 NOTE — ED Triage Notes (Signed)
Pt reports laceration in right index finger with a can 30 min ago.

## 2023-02-12 NOTE — ED Notes (Signed)
Tdap given in right deltoid.

## 2023-03-05 DIAGNOSIS — G4733 Obstructive sleep apnea (adult) (pediatric): Secondary | ICD-10-CM | POA: Diagnosis not present

## 2023-03-09 ENCOUNTER — Ambulatory Visit (INDEPENDENT_AMBULATORY_CARE_PROVIDER_SITE_OTHER): Payer: BC Managed Care – PPO | Admitting: *Deleted

## 2023-03-09 DIAGNOSIS — L501 Idiopathic urticaria: Secondary | ICD-10-CM | POA: Diagnosis not present

## 2023-03-14 ENCOUNTER — Other Ambulatory Visit: Payer: Self-pay | Admitting: Family Medicine

## 2023-03-14 DIAGNOSIS — R7303 Prediabetes: Secondary | ICD-10-CM

## 2023-03-16 ENCOUNTER — Ambulatory Visit: Payer: BC Managed Care – PPO | Admitting: Diagnostic Neuroimaging

## 2023-03-22 ENCOUNTER — Other Ambulatory Visit: Payer: Self-pay | Admitting: Family Medicine

## 2023-03-22 DIAGNOSIS — Z87442 Personal history of urinary calculi: Secondary | ICD-10-CM

## 2023-04-05 DIAGNOSIS — G4733 Obstructive sleep apnea (adult) (pediatric): Secondary | ICD-10-CM | POA: Diagnosis not present

## 2023-04-17 ENCOUNTER — Other Ambulatory Visit: Payer: Self-pay | Admitting: Family Medicine

## 2023-04-17 DIAGNOSIS — R7303 Prediabetes: Secondary | ICD-10-CM

## 2023-04-20 ENCOUNTER — Ambulatory Visit (INDEPENDENT_AMBULATORY_CARE_PROVIDER_SITE_OTHER): Payer: BC Managed Care – PPO | Admitting: *Deleted

## 2023-04-20 DIAGNOSIS — L501 Idiopathic urticaria: Secondary | ICD-10-CM

## 2023-05-02 DIAGNOSIS — H5712 Ocular pain, left eye: Secondary | ICD-10-CM | POA: Insufficient documentation

## 2023-05-04 ENCOUNTER — Ambulatory Visit
Admission: EM | Admit: 2023-05-04 | Discharge: 2023-05-04 | Disposition: A | Discharge status: Home / Self Care | Payer: BC Managed Care – PPO | Attending: Internal Medicine | Admitting: Internal Medicine

## 2023-05-04 DIAGNOSIS — H18892 Other specified disorders of cornea, left eye: Secondary | ICD-10-CM

## 2023-05-04 MED ORDER — ERYTHROMYCIN 5 MG/GM OP OINT: TOPICAL_OINTMENT | OPHTHALMIC | 0 refills | Status: DC

## 2023-05-04 NOTE — ED Triage Notes (Signed)
Pt reports pain in the left eye x 3 days.

## 2023-05-04 NOTE — Discharge Instructions (Signed)
Please follow-up with ophthalmologist as soon as possible.  I have prescribed an antibiotic ointment to help with symptoms.

## 2023-05-04 NOTE — ED Provider Notes (Signed)
[name]    CSN: [id number] Arrival date & time: 05/04/23  1748      History   Chief Complaint Chief Complaint  Patient presents with   Eye Pain    HPI Juan Nelson is a 57 y.o. male.   Patient presents with left eye pain for about 3 days.  Denies itching, drainage, trauma, foreign body to the eye.  Patient does wear glasses but does not wear contacts.  Denies any fever.  Denies any blurry vision.   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   Primary hypertension 12/25/2020   GERD with apnea 12/25/2020   Other seasonal allergic rhinitis 03/17/2017   Hx of laparoscopic adjustable gastric banding 02/08/2017   History of kidney stones 04/18/2014   Essential hypertension 04/18/2014   LFT elevation 10/08/2012   Hyperglycemia 10/08/2012    Past Surgical History:  Procedure Laterality Date   EXTRACORPOREAL SHOCK WAVE LITHOTRIPSY Right 11/05/2016   Procedure: RIGHT EXTRACORPOREAL SHOCK WAVE LITHOTRIPSY (ESWL);  Surgeon: Malen Gauze, MD;  Location: [facility];  Service: Urology;  Laterality: Right;   GANGLION CYST EXCISION  1989   LAPAROSCOPIC GASTRIC BANDING     NEVUS EXCISION Right 04/28/2017   SPINE SURGERY         Home Medications    Prior to Admission medications   Medication Sig Start Date End Date Taking? Authorizing Provider  erythromycin ophthalmic ointment Place a 1/2 inch ribbon of ointment into the lower eyelid 4 times daily. 05/04/23  Yes Yu Cragun, Rolly Salter E, FNP  acetaminophen (TYLENOL) 500 MG tablet Take 500 mg by mouth every 6 (six) hours as needed for headache.    [provider]  allopurinol (ZYLOPRIM) 100  MG tablet Take 1 tablet (100 mg total) by mouth daily. 01/04/23   Shade Flood, MD  amLODipine (NORVASC) 2.5 MG tablet Take 1 tablet (2.5 mg total) by mouth daily. 01/04/23   Shade Flood, MD  aspirin EC 81 MG tablet Take 81 mg by mouth daily.    [provider]  aspirin EC 81 MG tablet Take 81 mg by mouth daily. Swallow whole.    [provider]  brompheniramine-pseudoephedrine-DM 30-2-10 MG/5ML syrup Take 5 mLs by mouth 4 (four) times daily as needed. 08/08/22   Claiborne Rigg, NP  cholecalciferol (VITAMIN D) 1000 UNITS tablet Take 5,000 Units by mouth daily.    [provider]  colchicine 0.6 MG tablet Take two tabs once at the first sign of a gout flare and then one an hour later. Repeat in 24 hours if needed. Patient taking differently: Take 0.12 mg by mouth daily as needed (gout). Take two tabs once at the first sign of a gout flare and then one an hour later. Repeat in 24 hours if needed. 08/16/17   Ofilia Neas, PA-C  cyclobenzaprine (FLEXERIL) 10 MG tablet TAKE 1 TABLET(S) EVERY 6-8 HOURS AS NEEDED FOR SPASM 06/09/22   [provider]  diclofenac (VOLTAREN) 75 MG EC tablet Take 1 tablet (75 mg total)  by mouth 2 (two) times daily. As needed 01/29/21   Shade Flood, MD  EPINEPHrine (AUVI-Q) 0.3 mg/0.3 mL IJ SOAJ injection Inject 0.3 mg into the muscle as needed for anaphylaxis. 12/29/22   Alfonse Spruce, MD  fluticasone [facility]) 50 MCG/ACT nasal spray Place 2 sprays into both nostrils daily. 12/29/22   Alfonse Spruce, MD  ipratropium (ATROVENT) 0.03 % nasal spray Place 2 sprays into both nostrils 3 (three) times daily. 12/29/22   Alfonse Spruce, MD  lisinopril (ZESTRIL) 40 MG tablet Take 1 tablet (40 mg total) by mouth daily. 01/04/23   Shade Flood, MD  loratadine (ALLERGY) 10 MG tablet Take 1 tablet (10 mg total) by mouth daily. Alegra D 12/29/22   Alfonse Spruce, MD  Magnesium 100 MG CAPS Take by mouth.     [provider]  metFORMIN (GLUCOPHAGE) 500 MG tablet TAKE 1 TABLET BY MOUTH EVERY DAY WITH BREAKFAST 04/19/23   Shade Flood, MD  Multiple Vitamin (MULTIVITAMIN WITH MINERALS) TABS Take 1 tablet by mouth daily.    [provider]  omalizumab Geoffry Paradise) 150 MG injection Inject 300 mg into the skin every 28 (twenty-eight) days. 11/16/22   Alfonse Spruce, MD  [facility] VERIO test strip CHECK BLOOD SUGAR UP TO ONCE PER DAY. 03/15/23   Shade Flood, MD  tadalafil (CIALIS) 20 MG tablet Take 20 mg by mouth daily as needed for erectile dysfunction.  10/27/19   [provider]  tamsulosin (FLOMAX) 0.4 MG CAPS capsule TAKE 1 CAP DAILY AS NEEDED FOR SYMPTOMS OF KIDNEY STONE 03/22/23   Shade Flood, MD    Family History Family History  Problem Relation Age of Onset   Hypertension Mother    Diabetes Father    Cancer Maternal Grandmother    Depression Maternal Grandfather    Alzheimer's disease Paternal Grandmother    Cancer Paternal Grandfather     Social History Social History   Tobacco Use   Smoking status: Never   Smokeless tobacco: Never  Vaping Use   Vaping status: Never Used  Substance Use Topics   Alcohol use: No   Drug use: No     Allergies   Other, Pumpkin seed, Sesame seed (diagnostic), Bee venom, Insect extract, and Mobic [meloxicam]   Review of Systems Review of Systems Per HPI  Physical Exam Triage Vital Signs ED Triage Vitals  Encounter Vitals Group     BP 05/04/23 1937 (!) 133/93     Systolic BP Percentile --      Diastolic BP Percentile --      Pulse Rate 05/04/23 1937 78     Resp 05/04/23 1937 18     Temp 05/04/23 1937 98 F (36.7 C)     Temp Source 05/04/23 1937 Oral     SpO2 05/04/23 1937 96 %     Weight --      Height --      Head Circumference --      Peak Flow --      Pain Score 05/04/23 1940 6     Pain Loc --      Pain Education --      Exclude from Growth Chart --    No data found.  Updated Vital  Signs BP (!) 133/93 (BP Location: Left Arm)   Pulse 78   Temp 98 F (36.7 C) (Oral)   Resp 18   SpO2 96%   Visual Acuity Right Eye Distance:   Left  Eye Distance:   Bilateral Distance:    Right Eye Near:   Left Eye Near:    Bilateral Near:     Physical Exam Constitutional:      General: He is not in acute distress.    Appearance: Normal appearance. He is not toxic-appearing or diaphoretic.  HENT:     Head: Normocephalic and atraumatic.  Eyes:     General: Lids are normal. Lids are everted, no foreign bodies appreciated. Vision grossly intact. Gaze aligned appropriately.     Extraocular Movements: Extraocular movements intact.     Conjunctiva/sclera: Conjunctivae normal.     Pupils: Pupils are equal, round, and reactive to light.     Left eye: No corneal abrasion or fluorescein uptake.  Pulmonary:     Effort: Pulmonary effort is normal.  Neurological:     General: No focal deficit present.     Mental Status: He is alert and oriented to person, place, and time. Mental status is at baseline.  Psychiatric:        Mood and Affect: Mood normal.        Behavior: Behavior normal.        Thought Content: Thought content normal.        Judgment: Judgment normal.      UC Treatments / Results  Labs (all labs ordered are listed, but only abnormal results are displayed) Labs Reviewed - No data to display  EKG   Radiology No results found.  Procedures Procedures (including critical care time)  Medications Ordered in UC Medications - No data to display  Initial Impression / Assessment and Plan / UC Course  I have reviewed the triage vital signs and the nursing notes.  Pertinent labs & imaging results that were available during my care of the patient were reviewed by me and considered in my medical decision making (see chart for details).     Possibly some very mild corneal edema present to left lower eye.  Otherwise, no fluorescein reuptake or notable abnormality.   Will treat with erythromycin ointment to cover for any infection that could be causing this and patient was advised to follow-up as soon as possible with his established ophthalmologist.  Visual acuity appears normal.  Patient verbalized understanding and was agreeable with plan. Final Clinical Impressions(s) / UC Diagnoses   Final diagnoses:  Corneal irritation of left eye     Discharge Instructions      Please follow-up with ophthalmologist as soon as possible.  I have prescribed an antibiotic ointment to help with symptoms.    ED Prescriptions     Medication Sig Dispense Auth. Provider   erythromycin ophthalmic ointment Place a 1/2 inch ribbon of ointment into the lower eyelid 4 times daily. 3.5 g Gustavus Bryant, [place]      PDMP not reviewed this encounter.   Gustavus Bryant, [place] 05/04/23 2019

## 2023-05-05 ENCOUNTER — Ambulatory Visit (INDEPENDENT_AMBULATORY_CARE_PROVIDER_SITE_OTHER): Payer: BC Managed Care – PPO | Admitting: Ophthalmology

## 2023-05-05 ENCOUNTER — Encounter (INDEPENDENT_AMBULATORY_CARE_PROVIDER_SITE_OTHER): Payer: Self-pay | Admitting: Ophthalmology

## 2023-05-05 DIAGNOSIS — I10 Essential (primary) hypertension: Secondary | ICD-10-CM | POA: Diagnosis not present

## 2023-05-05 DIAGNOSIS — E119 Type 2 diabetes mellitus without complications: Secondary | ICD-10-CM

## 2023-05-05 DIAGNOSIS — H11442 Conjunctival cysts, left eye: Secondary | ICD-10-CM | POA: Diagnosis not present

## 2023-05-05 DIAGNOSIS — Z7984 Long term (current) use of oral hypoglycemic drugs: Secondary | ICD-10-CM

## 2023-05-05 DIAGNOSIS — H35033 Hypertensive retinopathy, bilateral: Secondary | ICD-10-CM

## 2023-05-05 DIAGNOSIS — H25813 Combined forms of age-related cataract, bilateral: Secondary | ICD-10-CM

## 2023-05-05 NOTE — Progress Notes (Signed)
[facility] - Clinic Note  05/05/2023   CHIEF COMPLAINT Patient presents for Retina Follow Up  HISTORY OF PRESENT ILLNESS: Juan Nelson is a 57 y.o. male who presents to the clinic today for:  HPI     Retina Follow Up           Laterality: left eye   Onset: 3 days ago   Duration: 3 days   Course: stable   MD Performed: performed the HPI with the patient and updated documentation appropriately         Comments   Retina follow up for corneal irritation OS pt was seen in ED last night due to pain in the right eye that got worse as the day went on it started on Sunday after working around the house unsure if he got anything in it had redness and pain but no watering       Last edited by Rennis Chris, MD on 05/05/2023  1:23 PM.    Pt is here on the referral of [facility] for eye pain OS, he states it started Saturday, he states he was given erythromycin ointment, but hasn't started it yet, he states he has not tried any drops,    Referring physician: Shade Flood, MD [address] [place],  [place] [address]  HISTORICAL INFORMATION:  Selected notes from the MEDICAL RECORD NUMBER ED f/u for corneal irritation LEE:  Ocular Hx- PMH-   CURRENT MEDICATIONS: Current Outpatient Medications (Ophthalmic Drugs)  Medication Sig   erythromycin ophthalmic ointment Place a 1/2 inch ribbon of ointment into the lower eyelid 4 times daily.   No current facility-administered medications for this visit. (Ophthalmic Drugs)   Current Outpatient Medications (Other)  Medication Sig   acetaminophen (TYLENOL) 500 MG tablet Take 500 mg by mouth every 6 (six) hours as needed for headache.   allopurinol (ZYLOPRIM) 100 MG tablet Take 1 tablet (100 mg total) by mouth daily.   amLODipine (NORVASC) 2.5 MG tablet Take 1 tablet (2.5 mg total) by mouth daily.   aspirin EC 81 MG tablet Take 81 mg by mouth daily.   aspirin EC 81 MG tablet Take 81 mg by mouth  daily. Swallow whole.   brompheniramine-pseudoephedrine-DM 30-2-10 MG/5ML syrup Take 5 mLs by mouth 4 (four) times daily as needed.   cholecalciferol (VITAMIN D) 1000 UNITS tablet Take 5,000 Units by mouth daily.   colchicine 0.6 MG tablet Take two tabs once at the first sign of a gout flare and then one an hour later. Repeat in 24 hours if needed. (Patient taking differently: Take 0.12 mg by mouth daily as needed (gout). Take two tabs once at the first sign of a gout flare and then one an hour later. Repeat in 24 hours if needed.)   cyclobenzaprine (FLEXERIL) 10 MG tablet TAKE 1 TABLET(S) EVERY 6-8 HOURS AS NEEDED FOR SPASM   diclofenac (VOLTAREN) 75 MG EC tablet Take 1 tablet (75 mg total) by mouth 2 (two) times daily. As needed   EPINEPHrine (AUVI-Q) 0.3 mg/0.3 mL IJ SOAJ injection Inject 0.3 mg into the muscle as needed for anaphylaxis.   fluticasone (FLONASE) 50 MCG/ACT nasal spray Place 2 sprays into both nostrils daily.   ipratropium (ATROVENT) 0.03 % nasal spray Place 2 sprays into both nostrils 3 (three) times daily.   lisinopril (ZESTRIL) 40 MG tablet Take 1 tablet (40 mg total) by mouth daily.   loratadine (ALLERGY) 10 MG tablet Take  1 tablet (10 mg total) by mouth daily. Alegra D   Magnesium 100 MG CAPS Take by mouth.   metFORMIN (GLUCOPHAGE) 500 MG tablet TAKE 1 TABLET BY MOUTH EVERY DAY WITH BREAKFAST   Multiple Vitamin (MULTIVITAMIN WITH MINERALS) TABS Take 1 tablet by mouth daily.   omalizumab (XOLAIR) 150 MG injection Inject 300 mg into the skin every 28 (twenty-eight) days.   ONETOUCH VERIO test strip CHECK BLOOD SUGAR UP TO ONCE PER DAY.   tadalafil (CIALIS) 20 MG tablet Take 20 mg by mouth daily as needed for erectile dysfunction.    tamsulosin (FLOMAX) 0.4 MG CAPS capsule TAKE 1 CAP DAILY AS NEEDED FOR SYMPTOMS OF KIDNEY STONE   Current Facility-Administered Medications (Other)  Medication Route   omalizumab Geoffry Paradise) injection 300 mg Subcutaneous   REVIEW OF SYSTEMS: ROS    Positive for: Cardiovascular, Eyes Last edited by Etheleen Mayhew, COT on 05/05/2023 12:53 PM.     ALLERGIES Allergies  Allergen Reactions   Other Anaphylaxis   Pumpkin Seed Hives and Swelling    Swelling of throat, tongue and lips. Swelling of throat, tongue and lips. Swelling of throat, tongue and lips.   Sesame Seed (Diagnostic) Anaphylaxis and Other (See Comments)   Bee Venom Swelling   Insect Extract Hives, Itching and Swelling   Mobic [Meloxicam] Nausea Only    "just didn't feel well"   PAST MEDICAL HISTORY Past Medical History:  Diagnosis Date   Allergy    Arthritis    right knee   GERD (gastroesophageal reflux disease)    History of kidney stones    multople stones    Hypertension    Kidney stone    Sleep apnea    Urticaria    Past Surgical History:  Procedure Laterality Date   EXTRACORPOREAL SHOCK WAVE LITHOTRIPSY Right 11/05/2016   Procedure: RIGHT EXTRACORPOREAL SHOCK WAVE LITHOTRIPSY (ESWL);  Surgeon: Malen Gauze, MD;  Location: [facility];  Service: Urology;  Laterality: Right;   GANGLION CYST EXCISION  1989   LAPAROSCOPIC GASTRIC BANDING     NEVUS EXCISION Right 04/28/2017   SPINE SURGERY     FAMILY HISTORY Family History  Problem Relation Age of Onset   Hypertension Mother    Diabetes Father    Cancer Maternal Grandmother    Depression Maternal Grandfather    Alzheimer's disease Paternal Grandmother    Cancer Paternal Grandfather    SOCIAL HISTORY Social History   Tobacco Use   Smoking status: Never   Smokeless tobacco: Never  Vaping Use   Vaping status: Never Used  Substance Use Topics   Alcohol use: No   Drug use: No       OPHTHALMIC EXAM:  Base Eye Exam     Visual Acuity (Snellen - Linear)       Right Left   Dist cc 20/20 20/20    Correction: Glasses         Tonometry (Tonopen, 12:57 PM)       Right Left   Pressure 18 16         Pupils       Pupils Dark Light Shape React APD   Right PERRL 4 3 Round  Brisk None   Left PERRL 4 3 Round Brisk None         Visual Fields       Left Right    Full Full         Extraocular Movement       Right Left  Full, Ortho Full, Ortho         Dilation     Both eyes: 2.5% Phenylephrine @ 12:57 PM           Slit Lamp and Fundus Exam     Slit Lamp Exam       Right Left   Lids/Lashes Dermatochalasis - upper lid Dermatochalasis - upper lid, no FB in fornix   Conjunctiva/Sclera White and quiet White and quiet, conj cyst IT quad -- no inflammation or injection   Cornea trace PEE Clear   Anterior Chamber deep and clear deep and clear   Iris Round and dilated Round and dilated   Lens 2+ Nuclear sclerosis, 2+ Cortical cataract 2+ Nuclear sclerosis, 2+ Cortical cataract   Vitreous mild syneresis mild syneresis         Fundus Exam       Right Left   Disc Pink and Sharp Pink and Sharp   C/D Ratio 0.5 0.5   Macula Flat, Good foveal reflex, RPE mottling, No heme or edema Flat, Good foveal reflex, RPE mottling, No heme or edema   Vessels Normal Normal   Periphery Attached Attached           IMAGING AND PROCEDURES  Imaging and Procedures for 05/05/2023  OCT, Retina - OU - Both Eyes       Right Eye Quality was good. Central Foveal Thickness: 273. Progression has no prior data. Findings include normal foveal contour, no IRF, no SRF, vitreomacular adhesion .   Left Eye Quality was good. Central Foveal Thickness: 257. Progression has no prior data. Findings include normal foveal contour, no IRF, no SRF, vitreomacular adhesion .   Notes *Images captured and stored on drive  Diagnosis / Impression:  NFP, no IRF/SRF OU  Clinical management:  See below  Abbreviations: NFP - Normal foveal profile. CME - cystoid macular edema. PED - pigment epithelial detachment. IRF - intraretinal fluid. SRF - subretinal fluid. EZ - ellipsoid zone. ERM - epiretinal membrane. ORA - outer retinal atrophy. ORT - outer retinal tubulation. SRHM -  subretinal hyper-reflective material. IRHM - intraretinal hyper-reflective material           ASSESSMENT/PLAN:   ICD-10-CM   1. Conjunctival cyst of left eye  H11.442     2. Diabetes mellitus type 2 without retinopathy (HCC)  E11.9     3. Long term (current) use of oral hypoglycemic drugs  Z79.84     4. Essential hypertension  I10     5. Hypertensive retinopathy of both eyes  H35.033 OCT, Retina - OU - Both Eyes    6. Combined forms of age-related cataract of both eyes  H25.813      1. Conjunctival Cyst OS  - unclear etiology -- perhaps caused by debris when cleaning  - advised artifical tears and cool compresses to OS to relieve symptoms  - ok to use erythromycin ointment prn as well  - f/u PRN  2,3. Diabetes mellitus, type 2 without retinopathy - The incidence, risk factors for progression, natural history and treatment options for diabetic retinopathy  were discussed with patient.   - The need for close monitoring of blood glucose, blood pressure, and serum lipids, avoiding cigarette or any type of tobacco, and the need for long term follow up was also discussed with patient. - f/u in 1 year, sooner prn  4,5. Hypertensive retinopathy OU - discussed importance of tight BP control - monitor  6. Mixed Cataract OU - The symptoms  of cataract, surgical options, and treatments and risks were discussed with patient. - discussed diagnosis and progression - monitor   Ophthalmic Meds Ordered this visit:  No orders of the defined types were placed in this encounter.    No follow-ups on file.  There are no Patient Instructions on file for this visit.  Explained the diagnoses, plan, and follow up with the patient and they expressed understanding.  Patient expressed understanding of the importance of proper follow up care.   This document serves as a record of services personally performed by Karie Chimera, MD, PhD. It was created on their behalf by Glee Arvin. Manson Passey, OA an  ophthalmic technician. The creation of this record is the provider's dictation and/or activities during the visit.    Electronically signed by: Glee Arvin. Manson Passey, OA 05/05/23 1:33 PM   Karie Chimera, M.D., Ph.D. Diseases & Surgery of the Retina and Vitreous [facility] 05/05/2023  I have reviewed the above documentation for accuracy and completeness, and I agree with the above. Karie Chimera, M.D., Ph.D. 05/05/23 1:37 PM   Abbreviations: M myopia (nearsighted); A astigmatism; H hyperopia (farsighted); P presbyopia; Mrx spectacle prescription;  CTL contact lenses; OD right eye; OS left eye; OU both eyes  XT exotropia; ET esotropia; PEK punctate epithelial keratitis; PEE punctate epithelial erosions; DES dry eye syndrome; MGD meibomian gland dysfunction; ATs artificial tears; PFAT's preservative free artificial tears; NSC nuclear sclerotic cataract; PSC posterior subcapsular cataract; ERM epi-retinal membrane; PVD posterior vitreous detachment; RD retinal detachment; DM diabetes mellitus; DR diabetic retinopathy; NPDR non-proliferative diabetic retinopathy; PDR proliferative diabetic retinopathy; CSME clinically significant macular edema; DME diabetic macular edema; dbh dot blot hemorrhages; CWS cotton wool spot; POAG primary open angle glaucoma; C/D cup-to-disc ratio; HVF humphrey visual field; GVF goldmann visual field; OCT optical coherence tomography; IOP intraocular pressure; BRVO Branch retinal vein occlusion; CRVO central retinal vein occlusion; CRAO central retinal artery occlusion; BRAO branch retinal artery occlusion; RT retinal tear; SB scleral buckle; PPV pars plana vitrectomy; VH Vitreous hemorrhage; PRP panretinal laser photocoagulation; IVK intravitreal kenalog; VMT vitreomacular traction; MH Macular hole;  NVD neovascularization of the disc; NVE neovascularization elsewhere; AREDS age related eye disease study; ARMD age related macular degeneration; POAG primary  open angle glaucoma; EBMD epithelial/anterior basement membrane dystrophy; ACIOL anterior chamber intraocular lens; IOL intraocular lens; PCIOL posterior chamber intraocular lens; Phaco/IOL phacoemulsification with intraocular lens placement; PRK photorefractive keratectomy; LASIK laser assisted in situ keratomileusis; HTN hypertension; DM diabetes mellitus; COPD chronic obstructive pulmonary disease

## 2023-05-07 DIAGNOSIS — G4733 Obstructive sleep apnea (adult) (pediatric): Secondary | ICD-10-CM | POA: Diagnosis not present

## 2023-06-01 ENCOUNTER — Ambulatory Visit: Payer: BC Managed Care – PPO | Admitting: *Deleted

## 2023-06-01 DIAGNOSIS — L501 Idiopathic urticaria: Secondary | ICD-10-CM

## 2023-06-01 DIAGNOSIS — D2272 Melanocytic nevi of left lower limb, including hip: Secondary | ICD-10-CM | POA: Diagnosis not present

## 2023-06-01 DIAGNOSIS — D225 Melanocytic nevi of trunk: Secondary | ICD-10-CM | POA: Diagnosis not present

## 2023-06-01 DIAGNOSIS — D2271 Melanocytic nevi of right lower limb, including hip: Secondary | ICD-10-CM | POA: Diagnosis not present

## 2023-06-02 NOTE — Progress Notes (Signed)
PATIENT: Juan Nelson DOB: February 13, 1966  REASON FOR VISIT: follow up HISTORY FROM: patient  Chief Complaint  Patient presents with   Follow-up    Pt in room 1. Here for cpap follow up. Pt reports doing well on cpap machine.     HISTORY OF PRESENT ILLNESS:  06/07/23 ALL:  Juan Nelson returns for follow up for OSA on CPAP. He is doing well. He uses CPAP nightly for about 7 hours, on average. He denies concerns with machine or supplies.     06/03/2022 ALL:  Juan Nelson returns for follow up for OSA on CPAP. He continues to do well on therapy. He is using CPAP nightly for about 7.5h. He denies concerns with machine or supplies. He does note better sleep quality and wakes feeling more refreshed using CPAP.     06/03/2021 ALL: Juan Nelson is a 57 y.o. male here today for follow up for OSA on CPAP.  HST 01/28/2021 showed OSA with AHI 19.8/hr. No hypoxemia. New CPAP machine was ordered. He is adjusting well. He is using a nasal mask. No concerns with machine or supplies.   Compliance report dated 05/05/2021-06/03/2021 shows that he used CPAP 29/30 days for greater than 4 hours for compliance of 97%. Average usage was 8 hours. Residual AHI was 0.5/hr on 6-16cmH20. No leak noted.   HISTORY: (copied from Dr Dohmeier's previous note)  Juan Nelson is a 58- year -old Caucasian male patient seen here as a referral on 12/25/2020 from Dr Neva Seat for a transfer of sleep apnea care.   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 He had back surgery in 2014, was seen  by Luciano Cutter, MD    The patient had the first sleep study in the year 2004-  with a result of an AHI ( Apnea Hypopnea index)  of 28/h, the patient had some periodic limb movements but this did not lead to arousals.  He had a normal cardiac rhythm according to Dr. Jetty Duhamel at the time he was referred by Dr. Dinah Beers.  This based on PSG was followed by a CPAP titration to 14 cmH2O dated 12-05-2002 the patient had snoring that was positional dependent.  He did not have central apneas only in rem sleep where there a few apneas remaining while on CPAP.  The AHI was 0.0 at 14 cm water pressure.   Sleep relevant medical history: one nocturia on CPAP- no snoring on CPAP.    Family medical /sleep history: his father with OSA.    Social history:  Patient is working as Research officer, political party an Runner, broadcasting/film/video - choir- church related.  and lives in a household allone. 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   Tobacco use: none .  ETOH use none ,Caffeine intake in form of Coffee(/) Soda( daily) Tea ( daily*) or energy drinks. Regular exercise in form of walking.    Sleep habits are as follows: The patient's dinner time is between 6-8.30 PM. The patient goes to bed  at 11-12 PM and continues to sleep for interval of 4 hours, wakes for one bathroom break.   The preferred sleep position is right and supine , with the support of 2 pillows with one additional body pillow- Dreams are reportedly rare. 8.30 AM is the usual rise time. The patient wakes up spontaneously as late as 8AM.  He reports not feeling refreshed or restored in AM, quality has ben declining-  Naps are taken infrequently, lasting from 20-30  minutes and are more refreshing than nocturnal sleep.    REVIEW OF SYSTEMS: Out of a complete 14 system review of symptoms, the patient complains only of the following symptoms, none and all other reviewed systems are negative.  ESS: previously 4/24,  now 1/24  ALLERGIES: Allergies  Allergen Reactions   Other Anaphylaxis   Pumpkin Seed Hives and Swelling    Swelling of throat, tongue and lips. Swelling of throat, tongue and lips. Swelling of throat, tongue and lips.   Sesame Seed (Diagnostic) Anaphylaxis and Other (See Comments)   Bee Venom Swelling   Insect Extract Hives, Itching and Swelling   Mobic [Meloxicam] Nausea Only    "just didn't feel well"    HOME MEDICATIONS: Outpatient Medications Prior to Visit  Medication Sig Dispense Refill   acetaminophen (TYLENOL) 500 MG tablet Take 500 mg by mouth every 6 (six) hours as needed for headache.     allopurinol (ZYLOPRIM) 100 MG tablet Take 1 tablet (100 mg total) by mouth daily. 30 tablet 5   amLODipine (NORVASC) 2.5 MG tablet Take 1 tablet (2.5 mg total) by mouth daily. 90 tablet 1   aspirin EC 81 MG tablet Take 81 mg by mouth daily. Swallow whole.     brompheniramine-pseudoephedrine-DM 30-2-10 MG/5ML syrup Take 5 mLs by mouth 4 (four) times daily as needed. 240 mL 0   cholecalciferol (VITAMIN D) 1000 UNITS tablet Take 5,000 Units by mouth daily.     colchicine 0.6 MG tablet Take two tabs once at the first sign of a gout flare and then one an hour later. Repeat in 24 hours if needed. (Patient taking differently: Take 0.12 mg by mouth daily as needed (gout). Take two tabs once at the first sign of a gout flare and then one an hour later. Repeat in 24 hours if needed.) 12 tablet 1   cyclobenzaprine (FLEXERIL) 10 MG tablet TAKE 1 TABLET(S) EVERY 6-8 HOURS AS NEEDED FOR SPASM     diclofenac (VOLTAREN) 75 MG EC tablet Take 1 tablet (75 mg total) by mouth 2 (two) times daily. As needed 180 tablet 0   EPINEPHrine (AUVI-Q) 0.3 mg/0.3 mL IJ SOAJ injection Inject 0.3 mg into the muscle as needed for anaphylaxis. 2 each 2   erythromycin ophthalmic ointment Place a 1/2 inch ribbon of ointment into the lower eyelid 4 times daily. 3.5 g 0   fluticasone (FLONASE) 50 MCG/ACT nasal spray Place 2  sprays into both nostrils daily. 16 g 5   ipratropium (ATROVENT) 0.03 % nasal spray Place 2 sprays into both nostrils 3 (three) times daily. 30 mL 12   lisinopril (ZESTRIL) 40 MG tablet Take 1 tablet (40 mg total) by mouth daily. 30 tablet 5   loratadine (ALLERGY) 10 MG tablet Take 1 tablet (10 mg total) by mouth daily. Alegra D 90 tablet 1   Magnesium 100 MG CAPS Take by mouth.     metFORMIN (GLUCOPHAGE) 500 MG tablet TAKE 1 TABLET BY MOUTH EVERY DAY WITH BREAKFAST 30 tablet 3  Multiple Vitamin (MULTIVITAMIN WITH MINERALS) TABS Take 1 tablet by mouth daily.     omalizumab (XOLAIR) 150 MG injection Inject 300 mg into the skin every 28 (twenty-eight) days. 2 each 11   ONETOUCH VERIO test strip CHECK BLOOD SUGAR UP TO ONCE PER DAY. 50 strip 1   tadalafil (CIALIS) 20 MG tablet Take 20 mg by mouth daily as needed for erectile dysfunction.      tamsulosin (FLOMAX) 0.4 MG CAPS capsule TAKE 1 CAP DAILY AS NEEDED FOR SYMPTOMS OF KIDNEY STONE 30 capsule 3   aspirin EC 81 MG tablet Take 81 mg by mouth daily.     Facility-Administered Medications Prior to Visit  Medication Dose Route Frequency Provider Last Rate Last Admin   omalizumab Geoffry Paradise) injection 300 mg  300 mg Subcutaneous Q28 days Marcelyn Bruins, MD   300 mg at 06/01/23 1013    PAST MEDICAL HISTORY: Past Medical History:  Diagnosis Date   Allergy    Arthritis    right knee   GERD (gastroesophageal reflux disease)    History of kidney stones    multople stones    Hypertension    Kidney stone    Sleep apnea    Urticaria     PAST SURGICAL HISTORY: Past Surgical History:  Procedure Laterality Date   EXTRACORPOREAL SHOCK WAVE LITHOTRIPSY Right 11/05/2016   Procedure: RIGHT EXTRACORPOREAL SHOCK WAVE LITHOTRIPSY (ESWL);  Surgeon: Malen Gauze, MD;  Location: [facility];  Service: Urology;  Laterality: Right;   GANGLION CYST EXCISION  1989   LAPAROSCOPIC GASTRIC BANDING     NEVUS EXCISION Right 04/28/2017   SPINE SURGERY       FAMILY HISTORY: Family History  Problem Relation Age of Onset   Hypertension Mother    Diabetes Father    Cancer Maternal Grandmother    Depression Maternal Grandfather    Alzheimer's disease Paternal Grandmother    Cancer Paternal Grandfather     SOCIAL HISTORY: Social History   Socioeconomic History   Marital status: Single    Spouse name: Not on file   Number of children: Not on file   Years of education: Not on file   Highest education level: Doctorate  Occupational History   Not on file  Tobacco Use   Smoking status: Never   Smokeless tobacco: Never  Vaping Use   Vaping status: Never Used  Substance and Sexual Activity   Alcohol use: No   Drug use: No   Sexual activity: Yes  Other Topics Concern   Not on file  Social History Narrative   Lives alone   Caffeine- varies 0-1   Social Determinants of Health   Financial Resource Strain: Low Risk  (12/18/2022)   Overall Financial Resource Strain (CARDIA)    Difficulty of Paying Living Expenses: Not hard at all  Food Insecurity: No Food Insecurity (12/18/2022)   Hunger Vital Sign    Worried About Running Out of Food in the Last Year: Never true    Ran Out of Food in the Last Year: Never true  Transportation Needs: No Transportation Needs (12/18/2022)   PRAPARE - Administrator, Civil Service (Medical): No    Lack of Transportation (Non-Medical): No  Physical Activity: Sufficiently Active (12/18/2022)   Exercise Vital Sign    Days of Exercise per Week: 5 days    Minutes of Exercise per Session: 50 min  Stress: No Stress Concern Present (12/18/2022)   [employer] of Occupational Health - Occupational Stress  Questionnaire    Feeling of Stress : Only a little  Social Connections: Moderately Integrated (12/18/2022)   Social Connection and Isolation Panel [NHANES]    Frequency of Communication with Friends and Family: More than three times a week    Frequency of Social Gatherings with Friends and  Family: Three times a week    Attends Religious Services: More than 4 times per year    Active Member of Clubs or Organizations: Yes    Attends Banker Meetings: More than 4 times per year    Marital Status: Never married  Intimate Partner Violence: Not on file     PHYSICAL EXAM  Vitals:   06/07/23 0953  BP: 132/72  Pulse: 69  Weight: 236 lb 6.4 oz (107.2 kg)  Height: 6\' 1"  (1.854 m)     Body mass index is 31.19 kg/m.  Generalized: Well developed, in no acute distress  Cardiology: normal rate and rhythm, no murmur noted Respiratory: clear to auscultation bilaterally  Neurological examination  Mentation: Alert oriented to time, place, history taking. Follows all commands speech and language fluent Cranial nerve II-XII: Pupils were equal round reactive to light. Extraocular movements were full, visual field were full  Motor: The motor testing reveals 5 over 5 strength of all 4 extremities. Good symmetric motor tone is noted throughout.  Gait and station: Gait is normal.    DIAGNOSTIC DATA (LABS, IMAGING, TESTING) - I reviewed patient records, labs, notes, testing and imaging myself where available.      No data to display           Lab Results  Component Value Date   WBC 8.3 05/18/2020   HGB 13.3 05/18/2020   HCT 38.0 (L) 05/18/2020   MCV 89.4 05/18/2020   PLT 158 05/18/2020      Component Value Date/Time   NA 140 01/04/2023 1052   NA 140 07/05/2020 1619   K 4.0 01/04/2023 1052   CL 105 01/04/2023 1052   CO2 27 01/04/2023 1052   GLUCOSE 105 (H) 01/04/2023 1052   BUN 14 01/04/2023 1052   BUN 12 07/05/2020 1619   CREATININE 0.89 01/04/2023 1052   CREATININE 1.05 01/24/2016 1342   CALCIUM 8.8 01/04/2023 1052   PROT 6.7 01/04/2023 1052   PROT 6.5 07/05/2020 1619   ALBUMIN 4.2 01/04/2023 1052   ALBUMIN 4.5 07/05/2020 1619   AST 30 01/04/2023 1052   ALT 40 01/04/2023 1052   ALKPHOS 79 01/04/2023 1052   BILITOT 0.6 01/04/2023 1052   BILITOT  0.4 07/05/2020 1619   GFRNONAA 92 07/05/2020 1619   GFRNONAA 82 01/24/2016 1342   GFRAA 106 07/05/2020 1619   GFRAA >89 01/24/2016 1342   Lab Results  Component Value Date   CHOL 132 07/05/2019   HDL 31 (L) 07/05/2019   LDLCALC 76 07/05/2019   TRIG 144 07/05/2019   CHOLHDL 4.3 07/05/2019   Lab Results  Component Value Date   HGBA1C 5.7 01/04/2023   Lab Results  Component Value Date   VITAMINB12 >2000 (H) 06/27/2018   Lab Results  Component Value Date   TSH 1.470 07/05/2019     ASSESSMENT AND PLAN 57 y.o. year old male  has a past medical history of Allergy, Arthritis, GERD (gastroesophageal reflux disease), History of kidney stones, Hypertension, Kidney stone, Sleep apnea, and Urticaria. here with     ICD-10-CM   1. OSA on CPAP  G47.33 For home use only DME continuous positive airway pressure (CPAP)  Naven B Colwell Nelson is doing well on CPAP therapy. Compliance report reveals excellent compliance. He was encouraged to continue using CPAP nightly and for greater than 4 hours each night. Risks of untreated sleep apnea review and education materials provided. Healthy lifestyle habits encouraged. He will follow up in 1 year, sooner if needed. He verbalizes understanding and agreement with this plan.    Orders Placed This Encounter  Procedures   For home use only DME continuous positive airway pressure (CPAP)    Supplies    Order Specific Question:   Length of Need    Answer:   Lifetime    Order Specific Question:   Patient has OSA or probable OSA    Answer:   Yes    Order Specific Question:   Is the patient currently using CPAP in the home    Answer:   Yes    Order Specific Question:   Settings    Answer:   Other see comments    Order Specific Question:   CPAP supplies needed    Answer:   Mask, headgear, cushions, filters, heated tubing and water chamber      No orders of the defined types were placed in this encounter.      Shawnie Dapper, FNP-C  06/07/2023, 10:17 AM [facility] [address], [address] [place], [place] [address] [phone]

## 2023-06-02 NOTE — Patient Instructions (Signed)

## 2023-06-03 ENCOUNTER — Telehealth: Payer: Self-pay | Admitting: *Deleted

## 2023-06-03 NOTE — Telephone Encounter (Signed)
Called patient asking him to bring his cpap machine along with power cord to visit on 06/07/23. Pt verbalized  he understood.

## 2023-06-06 DIAGNOSIS — G4733 Obstructive sleep apnea (adult) (pediatric): Secondary | ICD-10-CM | POA: Diagnosis not present

## 2023-06-07 ENCOUNTER — Ambulatory Visit: Payer: BC Managed Care – PPO | Admitting: Family Medicine

## 2023-06-07 ENCOUNTER — Encounter: Payer: Self-pay | Admitting: Family Medicine

## 2023-06-07 VITALS — BP 132/72 | HR 69 | Ht 73.0 in | Wt 236.4 lb

## 2023-06-07 DIAGNOSIS — G4733 Obstructive sleep apnea (adult) (pediatric): Secondary | ICD-10-CM

## 2023-06-12 ENCOUNTER — Other Ambulatory Visit: Payer: Self-pay | Admitting: Family Medicine

## 2023-06-12 DIAGNOSIS — I10 Essential (primary) hypertension: Secondary | ICD-10-CM

## 2023-07-01 ENCOUNTER — Ambulatory Visit (INDEPENDENT_AMBULATORY_CARE_PROVIDER_SITE_OTHER): Payer: BC Managed Care – PPO | Admitting: Otolaryngology

## 2023-07-01 ENCOUNTER — Encounter (INDEPENDENT_AMBULATORY_CARE_PROVIDER_SITE_OTHER): Payer: Self-pay

## 2023-07-01 VITALS — Ht 73.0 in | Wt 237.0 lb

## 2023-07-01 DIAGNOSIS — J310 Chronic rhinitis: Secondary | ICD-10-CM

## 2023-07-01 DIAGNOSIS — H903 Sensorineural hearing loss, bilateral: Secondary | ICD-10-CM | POA: Diagnosis not present

## 2023-07-01 DIAGNOSIS — H6123 Impacted cerumen, bilateral: Secondary | ICD-10-CM | POA: Diagnosis not present

## 2023-07-01 DIAGNOSIS — H6983 Other specified disorders of Eustachian tube, bilateral: Secondary | ICD-10-CM

## 2023-07-04 DIAGNOSIS — H903 Sensorineural hearing loss, bilateral: Secondary | ICD-10-CM | POA: Insufficient documentation

## 2023-07-04 DIAGNOSIS — J310 Chronic rhinitis: Secondary | ICD-10-CM | POA: Insufficient documentation

## 2023-07-04 DIAGNOSIS — H6983 Other specified disorders of Eustachian tube, bilateral: Secondary | ICD-10-CM | POA: Insufficient documentation

## 2023-07-04 DIAGNOSIS — H6123 Impacted cerumen, bilateral: Secondary | ICD-10-CM | POA: Insufficient documentation

## 2023-07-04 NOTE — Progress Notes (Signed)
Patient ID: Juan Nelson, male   DOB: July 22, 1966, 57 y.o.   MRN: [id number]  Follow-up: Hearing loss, left ear discomfort, recurrent cerumen impaction  HPI: The patient is a 57 year old male who returns today for his follow-up evaluation.  The patient was last seen 1 year ago.  At that time, he was noted to have bilateral high-frequency sensorineural hearing loss, eustachian tube dysfunction, and chronic nasal congestion.  He was treated with Flonase nasal spray and Valsalva exercise.  The patient returns today reporting no significant change in his hearing.  However, he continues to have intermittent nasal congestion.  The severity of his nasal congestion has improved with the use of Flonase.  He has noted increasing clogging sensation in his ears lately.  Currently he denies any significant otalgia, otorrhea, or vertigo.  Exam: General: Communicates without difficulty, well nourished, no acute distress. Head: Normocephalic, no evidence injury, no tenderness, facial buttresses intact without stepoff. Face/sinus: No tenderness to palpation and percussion. Facial movement is normal and symmetric. Eyes: PERRL, EOMI. No scleral icterus, conjunctivae clear. Neuro: CN II exam reveals vision grossly intact.  No nystagmus at any point of gaze. Ears: Auricles well formed without lesions.  EAC: Bilateral cerumen impaction.  Under the operating microscope, the cerumen is carefully removed with a combination of cerumen currette, alligator forceps, and suction catheters.  After the cerumen is removed, the TMs are noted to be normal.   Nose: External evaluation reveals normal support and skin without lesions.  Dorsum is intact.  Anterior rhinoscopy reveals congested mucosa over anterior aspect of inferior turbinates and intact septum.  No purulence noted. Oral:  Oral cavity and oropharynx are intact, symmetric, without erythema or edema.  Mucosa is moist without lesions. Neck: Full range of motion without pain.   There is no significant lymphadenopathy.  No masses palpable.  Thyroid bed within normal limits to palpation.  Parotid glands and submandibular glands equal bilaterally without mass.  Trachea is midline. Neuro:  CN 2-12 grossly intact.   Assessment: 1.  Recurrent cerumen impaction.  After the disimpaction procedure, both tympanic membranes and middle ear spaces are noted to be normal. 2.  Subjectively stable bilateral high-frequency sensorineural hearing loss. 3.  Chronic rhinitis with nasal mucosal congestion. 4.  Clinical eustachian tube dysfunction.  Plan: 1.  Otomicroscopy with bilateral cerumen disimpaction. 2.  The physical exam findings are reviewed with the patient. 3.  Continue the use of Flonase nasal spray daily. 4.  Valsalva exercise as needed. 5.  The patient will return for reevaluation in 1 year.

## 2023-07-07 ENCOUNTER — Encounter: Payer: BC Managed Care – PPO | Admitting: Family Medicine

## 2023-07-07 DIAGNOSIS — G4733 Obstructive sleep apnea (adult) (pediatric): Secondary | ICD-10-CM | POA: Diagnosis not present

## 2023-07-13 ENCOUNTER — Ambulatory Visit: Payer: Self-pay | Admitting: *Deleted

## 2023-07-13 DIAGNOSIS — L501 Idiopathic urticaria: Secondary | ICD-10-CM

## 2023-08-06 ENCOUNTER — Other Ambulatory Visit: Payer: Self-pay | Admitting: Family Medicine

## 2023-08-06 DIAGNOSIS — R7303 Prediabetes: Secondary | ICD-10-CM

## 2023-08-11 DIAGNOSIS — N401 Enlarged prostate with lower urinary tract symptoms: Secondary | ICD-10-CM | POA: Diagnosis not present

## 2023-08-11 DIAGNOSIS — N200 Calculus of kidney: Secondary | ICD-10-CM | POA: Diagnosis not present

## 2023-08-11 DIAGNOSIS — R350 Frequency of micturition: Secondary | ICD-10-CM | POA: Diagnosis not present

## 2023-08-11 DIAGNOSIS — N281 Cyst of kidney, acquired: Secondary | ICD-10-CM | POA: Diagnosis not present

## 2023-08-12 DIAGNOSIS — G4733 Obstructive sleep apnea (adult) (pediatric): Secondary | ICD-10-CM | POA: Diagnosis not present

## 2023-08-23 ENCOUNTER — Ambulatory Visit (INDEPENDENT_AMBULATORY_CARE_PROVIDER_SITE_OTHER): Payer: BC Managed Care – PPO

## 2023-08-23 DIAGNOSIS — L501 Idiopathic urticaria: Secondary | ICD-10-CM | POA: Diagnosis not present

## 2023-08-26 ENCOUNTER — Ambulatory Visit: Payer: BC Managed Care – PPO | Admitting: Family Medicine

## 2023-08-26 ENCOUNTER — Encounter: Payer: Self-pay | Admitting: Family Medicine

## 2023-08-26 VITALS — BP 120/78 | HR 71 | Temp 98.4°F | Ht 73.0 in | Wt 242.0 lb

## 2023-08-26 DIAGNOSIS — Z8739 Personal history of other diseases of the musculoskeletal system and connective tissue: Secondary | ICD-10-CM

## 2023-08-26 DIAGNOSIS — Z6831 Body mass index (BMI) 31.0-31.9, adult: Secondary | ICD-10-CM

## 2023-08-26 DIAGNOSIS — R7303 Prediabetes: Secondary | ICD-10-CM | POA: Diagnosis not present

## 2023-08-26 DIAGNOSIS — Z13220 Encounter for screening for lipoid disorders: Secondary | ICD-10-CM | POA: Diagnosis not present

## 2023-08-26 DIAGNOSIS — I10 Essential (primary) hypertension: Secondary | ICD-10-CM

## 2023-08-26 DIAGNOSIS — E66811 Obesity, class 1: Secondary | ICD-10-CM

## 2023-08-26 DIAGNOSIS — R739 Hyperglycemia, unspecified: Secondary | ICD-10-CM

## 2023-08-26 DIAGNOSIS — Z0000 Encounter for general adult medical examination without abnormal findings: Secondary | ICD-10-CM

## 2023-08-26 LAB — CBC WITH DIFFERENTIAL/PLATELET
Basophils Absolute: 0 10*3/uL (ref 0.0–0.1)
Basophils Relative: 0.5 % (ref 0.0–3.0)
Eosinophils Absolute: 0.2 10*3/uL (ref 0.0–0.7)
Eosinophils Relative: 4.3 % (ref 0.0–5.0)
HCT: 41.1 % (ref 39.0–52.0)
Hemoglobin: 14.2 g/dL (ref 13.0–17.0)
Lymphocytes Relative: 29.4 % (ref 12.0–46.0)
Lymphs Abs: 1.5 10*3/uL (ref 0.7–4.0)
MCHC: 34.4 g/dL (ref 30.0–36.0)
MCV: 88.1 fL (ref 78.0–100.0)
Monocytes Absolute: 0.5 10*3/uL (ref 0.1–1.0)
Monocytes Relative: 9.8 % (ref 3.0–12.0)
Neutro Abs: 2.9 10*3/uL (ref 1.4–7.7)
Neutrophils Relative %: 56 % (ref 43.0–77.0)
Platelets: 182 10*3/uL (ref 150.0–400.0)
RBC: 4.67 Mil/uL (ref 4.22–5.81)
RDW: 13.5 % (ref 11.5–15.5)
WBC: 5.2 10*3/uL (ref 4.0–10.5)

## 2023-08-26 LAB — PSA: PSA: 1.01 ng/mL (ref 0.10–4.00)

## 2023-08-26 LAB — MICROALBUMIN / CREATININE URINE RATIO
Creatinine,U: 46.5 mg/dL
Microalb Creat Ratio: 1.5 mg/g (ref 0.0–30.0)
Microalb, Ur: <0.7 mg/dL (ref 0.0–1.9)

## 2023-08-26 LAB — COMPREHENSIVE METABOLIC PANEL
ALT: 52 U/L (ref 0–53)
AST: 41 U/L — ABNORMAL HIGH (ref 0–37)
Albumin: 4.6 g/dL (ref 3.5–5.2)
Alkaline Phosphatase: 90 U/L (ref 39–117)
BUN: 15 mg/dL (ref 6–23)
CO2: 27 meq/L (ref 19–32)
Calcium: 9 mg/dL (ref 8.4–10.5)
Chloride: 103 meq/L (ref 96–112)
Creatinine, Ser: 0.94 mg/dL (ref 0.40–1.50)
GFR: 89.04 mL/min (ref >60.00)
Glucose, Bld: 121 mg/dL — ABNORMAL HIGH (ref 70–99)
Potassium: 4.1 meq/L (ref 3.5–5.1)
Sodium: 138 meq/L (ref 135–145)
Total Bilirubin: 0.9 mg/dL (ref 0.2–1.2)
Total Protein: 6.6 g/dL (ref 6.0–8.3)

## 2023-08-26 LAB — LIPID PANEL
Cholesterol: 153 mg/dL (ref 0–200)
HDL: 34.9 mg/dL — ABNORMAL LOW (ref >39.00)
LDL Cholesterol: 86 mg/dL (ref 0–99)
NonHDL: 117.65
Total CHOL/HDL Ratio: 4
Triglycerides: 156 mg/dL — ABNORMAL HIGH (ref 0.0–149.0)
VLDL: 31.2 mg/dL (ref 0.0–40.0)

## 2023-08-26 LAB — TSH: TSH: 0.94 u[IU]/mL (ref 0.35–5.50)

## 2023-08-26 LAB — HEMOGLOBIN A1C: Hgb A1c MFr Bld: 6.1 % (ref 4.6–6.5)

## 2023-08-26 MED ORDER — LISINOPRIL 40 MG PO TABS: 40.0000 mg | ORAL_TABLET | Freq: Every day | ORAL | 5 refills | Status: DC

## 2023-08-26 MED ORDER — ALLOPURINOL 100 MG PO TABS: 100.0000 mg | ORAL_TABLET | Freq: Every day | ORAL | 5 refills | Status: DC

## 2023-08-26 MED ORDER — AMLODIPINE BESYLATE 2.5 MG PO TABS: 2.5000 mg | ORAL_TABLET | Freq: Every day | ORAL | 5 refills | Status: DC

## 2023-08-26 MED ORDER — WEGOVY 0.25 MG/0.5ML ~~LOC~~ SOAJ: 0.2500 mg | SUBCUTANEOUS | 1 refills | Status: DC

## 2023-08-26 MED ORDER — ONETOUCH VERIO VI STRP: ORAL_STRIP | 1 refills | Status: AC

## 2023-08-26 NOTE — Progress Notes (Signed)
Subjective:  Patient ID: Juan Nelson, male    DOB: 09-10-1965  Age: 57 y.o. MRN: [id number]  CC:  Chief Complaint  Patient presents with   Annual Exam    Pt is well, curious about weight loss medications BG 105 on average recently     HPI Juan Nelson presents for Annual Exam Only change - mother living with him at home - going well.   PCP, me.  ENT, Dr. Suszanne Conners, recent appointment, recurrent sooner impaction, stable bilateral high-frequency sensorineural hearing loss, chronic rhinitis and eustachian tube dysfunction Sleep specialist, [place] [facility], visit in October.  Continued on CPAP for OSA.  Ophthalmology, Dr. Vanessa Barbara, hypertensive retinopathy, age-related cataracts, conjunctival cyst of left eye, appointment September 4 Allergist, Dr. Dellis Anes with idiopathic urticaria Neuro, Dr. Marjory Lies, evaluated for right calf atrophy in May, no concerns, exercise to build muscle.  recommended.  Ortho, Dr. Rennis Chris Urology, Dr. Cardell Peach, urolithiasis, BPH/LUTS appointment December 11.  PSA screening through urology.  Prediabetes: Metformin 500 mg daily.no new side effects.  He is interested in possible weight loss medication - Reginal Lutes may be covered.  No prior use. No FH of MEN syndrome, or thyroid CA. Possible risk/benefits, side effects and need for likley long term use discussed. He would like to try [facility].  Stuck at same weight past few years.  Exercise with walking and moderate weight training 4-5 days per week.  Has tried more veggies, decreased fatty foods.   Lab Results  Component Value Date   HGBA1C 5.7 01/04/2023   Wt Readings from Last 3 Encounters:  08/26/23 242 lb (109.8 kg)  07/01/23 237 lb (107.5 kg)  06/07/23 236 lb 6.4 oz (107.2 kg)   Hypertension: Lisinopril 40 mg daily, and improved on addition of amlodipine 2.5mg . Has been seen by nephrology previously.feels better on current combo.  Home readings: 120/70 range.  BP Readings from Last 3 Encounters:   08/26/23 120/78  06/07/23 132/72  05/04/23 (!) 133/93   Lab Results  Component Value Date   CREATININE 0.89 01/04/2023   Gout: Last flare: None recently Daily meds: Allopurinol 100 mg daily Prn med: Colchicine as needed - not needed recently. Lab Results  Component Value Date   LABURIC 6.2 08/01/2021        08/26/2023   10:18 AM 01/04/2023    9:52 AM 12/21/2022    8:37 AM 06/26/2022    9:25 AM 03/26/2022    8:24 AM  Depression screen PHQ 2/9  Decreased Interest 0 0 0 0 0  Down, Depressed, Hopeless 0 0 0 0 1  PHQ - 2 Score 0 0 0 0 1  Altered sleeping 0  0 0 0  Tired, decreased energy 0 0 1 1 1   Change in appetite 2 2 1  0 2  Feeling bad or failure about yourself  0 0 0 0 0  Trouble concentrating 0  0 0 0  Moving slowly or fidgety/restless 0 0 0 0 0  Suicidal thoughts 0 0 0 0 0  PHQ-9 Score 2  2 1 4   Difficult doing work/chores  Not difficult at all       Health Maintenance  Topic Date Due   Diabetic kidney evaluation - Urine ACR  Never done   Diabetic kidney evaluation - eGFR measurement  01/04/2024   Colonoscopy  09/03/2024   DTaP/Tdap/Td (3 - Td or Tdap) 02/11/2033   INFLUENZA VACCINE  Completed   COVID-19 Vaccine  Completed   Hepatitis C Screening  Completed   HIV Screening  Completed   Zoster Vaccines- Shingrix  Completed   HPV VACCINES  Aged Out  Colonoscopy in 2016 - appt with GI in 2 weeks. 7-10 year repeat - will discuss interval at upcoming appt.  Prostate: as above - managed by urology.   Immunization History  Administered Date(s) Administered   Hepatitis B 12/17/2011, 01/19/2012, 06/21/2012   Influenza Inj Mdck Quad Pf 05/21/2018   Influenza, Quadrivalent, Recombinant, Inj, Pf 05/06/2019   Influenza,inj,Quad PF,6+ Mos 06/03/2017, 05/03/2020, 04/23/2022   Influenza-Unspecified 05/27/2015, 05/01/2016, 05/21/2018   PFIZER(Purple Top)SARS-COV-2 Vaccination 11/04/2019, 11/27/2019, 05/28/2020, 11/29/2020   Pfizer(Comirnaty)Fall Seasonal Vaccine 12 years  and older 04/29/2023   Tdap 06/03/2017, 02/12/2023   Zoster Recombinant(Shingrix) 01/29/2021, 08/01/2021  Flu vaccine at pharmacy in August, [facility].   No results found.recent optho visit and appt in January.   Dental: every 6 months.   Alcohol: none  Tobacco: none  Exercise: as above  - most days per week - cardio and weights.  Fasting today.    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   Primary hypertension 12/25/2020   GERD with apnea 12/25/2020   Other seasonal allergic rhinitis 03/17/2017   Hx of laparoscopic adjustable gastric banding 02/08/2017   History of kidney stones 04/18/2014   Essential hypertension 04/18/2014   LFT elevation 10/08/2012   Hyperglycemia 10/08/2012   Past Medical History:  Diagnosis Date   Allergy    Arthritis    right knee   GERD (gastroesophageal reflux disease)    History of kidney stones    multople stones    Hypertension    Kidney stone    Sleep apnea    Urticaria    Past Surgical History:  Procedure Laterality Date   EXTRACORPOREAL SHOCK WAVE LITHOTRIPSY Right 11/05/2016   Procedure: RIGHT EXTRACORPOREAL SHOCK WAVE LITHOTRIPSY (ESWL);  Surgeon: Malen Gauze, MD;  Location: [facility];  Service: Urology;  Laterality: Right;   GANGLION CYST EXCISION  1989   LAPAROSCOPIC GASTRIC BANDING     NEVUS EXCISION Right 04/28/2017   SPINE SURGERY     Allergies  Allergen Reactions   Other Anaphylaxis   Pumpkin Seed Hives and Swelling    Swelling of throat, tongue and lips. Swelling of throat, tongue and lips. Swelling of throat, tongue and lips.   Sesame Seed (Diagnostic) Anaphylaxis and Other (See Comments)   Bee Venom Swelling   Insect Extract Hives,  Itching and Swelling   Mobic [Meloxicam] Nausea Only    "just didn't feel well"   Prior to Admission medications   Medication Sig Start Date End Date Taking? Authorizing Provider  acetaminophen (TYLENOL) 500 MG tablet Take 500 mg by mouth every 6 (six) hours as needed for headache.   Yes [provider]  allopurinol (ZYLOPRIM) 100 MG tablet Take 1 tablet (100 mg total) by mouth daily. 01/04/23  Yes Shade Flood, MD  amLODipine (NORVASC) 2.5 MG tablet TAKE 1 TABLET BY MOUTH EVERY DAY 06/14/23  Yes Shade Flood, MD  aspirin EC 81 MG tablet Take 81 mg by mouth daily. Swallow whole.   Yes [provider]  brompheniramine-pseudoephedrine-DM 30-2-10 MG/5ML syrup Take 5 mLs by mouth 4 (four) times daily as needed. 08/08/22  Yes Claiborne Rigg, NP  cholecalciferol (VITAMIN D) 1000 UNITS tablet Take 5,000 Units by mouth daily.   Yes [provider]  colchicine 0.6 MG tablet Take two tabs once at the first sign of a gout flare and then one an hour later. Repeat in 24 hours if needed. Patient taking differently: Take 0.12 mg by mouth daily as needed (gout). Take two tabs once at the first sign of a gout flare and then one an hour later. Repeat in 24 hours if needed. 08/16/17  Yes Ofilia Neas, PA-C  cyclobenzaprine (FLEXERIL) 10 MG tablet TAKE 1 TABLET(S) EVERY 6-8 HOURS AS NEEDED FOR SPASM 06/09/22  Yes [provider]  diclofenac (VOLTAREN) 75 MG EC tablet Take 1 tablet (75 mg total) by mouth 2 (two) times daily. As needed 01/29/21  Yes Shade Flood, MD  EPINEPHrine (AUVI-Q) 0.3 mg/0.3 mL IJ SOAJ injection Inject 0.3 mg into the muscle as needed for anaphylaxis. 12/29/22  Yes Alfonse Spruce, MD  erythromycin ophthalmic ointment Place a 1/2 inch ribbon of ointment into the lower eyelid 4 times daily. 05/04/23  Yes [place], Rolly Salter E, FNP  fluticasone (FLONASE) 50 MCG/ACT nasal spray Place 2 sprays into both nostrils daily. 12/29/22  Yes Alfonse Spruce, MD  ipratropium (ATROVENT) 0.03 % nasal spray Place 2 sprays into both nostrils 3 (three) times daily. 12/29/22  Yes Alfonse Spruce, MD  lisinopril (ZESTRIL) 40 MG tablet Take 1 tablet (40 mg total) by mouth daily. 01/04/23  Yes Shade Flood, MD  loratadine (ALLERGY) 10 MG tablet Take 1 tablet (10 mg total) by mouth daily. Alegra D 12/29/22  Yes Alfonse Spruce, MD  Magnesium 100 MG CAPS Take by mouth.   Yes [provider]  metFORMIN (GLUCOPHAGE) 500 MG tablet TAKE 1 TABLET BY MOUTH EVERY DAY WITH BREAKFAST 08/06/23  Yes Shade Flood, MD  Multiple Vitamin (MULTIVITAMIN WITH MINERALS) TABS Take 1 tablet by mouth daily.   Yes [provider]  omalizumab Geoffry Paradise) 150 MG injection Inject 300 mg into the skin every 28 (twenty-eight) days. 11/16/22  Yes Alfonse Spruce, MD  [facility] VERIO test strip CHECK BLOOD SUGAR UP TO ONCE PER DAY. 03/15/23  Yes Shade Flood, MD  tadalafil (CIALIS) 20 MG tablet Take 20 mg by mouth daily as needed for erectile dysfunction.  10/27/19  Yes [provider]  tamsulosin (FLOMAX) 0.4 MG CAPS capsule TAKE 1 CAP DAILY AS NEEDED FOR SYMPTOMS OF KIDNEY STONE 03/22/23  Yes Shade Flood, MD   Social History   Socioeconomic History   Marital status: Single    Spouse name: Not on file   Number of children: Not on file   Years of education: Not on file   Highest education level: Doctorate  Occupational History   Not on file  Tobacco Use   Smoking status: Never   Smokeless tobacco: Never  Vaping Use   Vaping status: Never Used  Substance and Sexual Activity   Alcohol use: No   Drug use: No   Sexual activity: Yes  Other Topics Concern   Not on file  Social History Narrative   Lives alone   Caffeine- varies 0-1   Social Drivers of Health   Financial Resource Strain: Low Risk  (08/25/2023)   Overall Financial Resource Strain (CARDIA)    Difficulty of Paying Living Expenses: Not hard at all  Food  Insecurity: No Food Insecurity (08/25/2023)   Hunger Vital Sign  Worried About Programme researcher, broadcasting/film/video in the Last Year: Never true    Ran Out of Food in the Last Year: Never true  Transportation Needs: No Transportation Needs (08/25/2023)   PRAPARE - Administrator, Civil Service (Medical): No    Lack of Transportation (Non-Medical): No  Physical Activity: Insufficiently Active (08/25/2023)   Exercise Vital Sign    Days of Exercise per Week: 4 days    Minutes of Exercise per Session: 30 min  Stress: No Stress Concern Present (08/25/2023)   [employer] of Occupational Health - Occupational Stress Questionnaire    Feeling of Stress : Only a little  Social Connections: Moderately Integrated (08/25/2023)   Social Connection and Isolation Panel [NHANES]    Frequency of Communication with Friends and Family: More than three times a week    Frequency of Social Gatherings with Friends and Family: More than three times a week    Attends Religious Services: More than 4 times per year    Active Member of [employer] or Organizations: No    Attends Engineer, structural: More than 4 times per year    Marital Status: Never married  Intimate Partner Violence: Not on file    Review of Systems  13 point review of systems per patient health survey noted.  Negative other than as indicated above or in HPI.   Objective:   Vitals:   08/26/23 1013  BP: 120/78  Pulse: 71  Temp: 98.4 F (36.9 C)  TempSrc: Temporal  SpO2: 98%  Weight: 242 lb (109.8 kg)  Height: 6\' 1"  (1.854 m)     Physical Exam Vitals reviewed.  Constitutional:      Appearance: He is well-developed.  HENT:     Head: Normocephalic and atraumatic.     Right Ear: External ear normal.     Left Ear: External ear normal.  Eyes:     Conjunctiva/sclera: Conjunctivae normal.     Pupils: Pupils are equal, round, and reactive to light.  Neck:     Thyroid: No thyromegaly.  Cardiovascular:     Rate and Rhythm:  Normal rate and regular rhythm.     Heart sounds: Normal heart sounds.  Pulmonary:     Effort: Pulmonary effort is normal. No respiratory distress.     Breath sounds: Normal breath sounds. No wheezing.  Abdominal:     General: There is no distension.     Palpations: Abdomen is soft.     Tenderness: There is no abdominal tenderness.  Musculoskeletal:        General: No tenderness. Normal range of motion.     Cervical back: Normal range of motion and neck supple.  Lymphadenopathy:     Cervical: No cervical adenopathy.  Skin:    General: Skin is warm and dry.  Neurological:     Mental Status: He is alert and oriented to person, place, and time.     Deep Tendon Reflexes: Reflexes are normal and symmetric.  Psychiatric:        Behavior: Behavior normal.     Assessment & Plan:  Juan Nelson is a 57 y.o. male . Annual physical exam - Plan: CBC with Differential/Platelet, Comprehensive metabolic panel, Lipid panel, PSA, Hemoglobin A1c, Microalbumin / creatinine urine ratio  - -anticipatory guidance as below in AVS, screening labs above. Health maintenance items as above in HPI discussed/recommended as applicable.   Essential hypertension - Plan: CBC with Differential/Platelet, amLODipine (NORVASC) 2.5 MG tablet, lisinopril (ZESTRIL)  40 MG tablet, TSH  -Stable on current regimen, continue same, labs as above.  History of gout - Plan: allopurinol (ZYLOPRIM) 100 MG tablet  -Stable without recent flare, continue allopurinol  Prediabetes - Plan: Comprehensive metabolic panel, Hemoglobin A1c, Microalbumin / creatinine urine ratio, glucose blood (ONETOUCH VERIO) test strip, Semaglutide-Weight Management (WEGOVY) 0.25 MG/0.5ML SOAJ  -Check A1c, urine microalbumin.  Unfortunately his had difficulty with weight loss in spite of dietary changes and exercise as above.  Treatment options discussed including GLP-1's.  We discussed potential benefit but also risks as above and potential need for  long-term treatment for weight management.  He would like to try Wegovy, low-dose initially started at 0.25 mg weekly with close follow-up in 1 month, RTC precautions if new side effects.  Hyperglycemia  -A1c as above  Screening for lipid disorders - Plan: Lipid panel  Screening for prostate cancer - Plan: PSA  -PSA obtained but continue follow-up with urology.  Class 1 obesity with serious comorbidity and body mass index (BMI) of 31.0 to 31.9 in adult, unspecified obesity type - Plan: Semaglutide-Weight Management (WEGOVY) 0.25 MG/0.5ML SOAJ  -As above, start [facility] with 1 month follow-up.  Meds ordered this encounter  Medications   amLODipine (NORVASC) 2.5 MG tablet    Sig: Take 1 tablet (2.5 mg total) by mouth daily.    Dispense:  30 tablet    Refill:  5   lisinopril (ZESTRIL) 40 MG tablet    Sig: Take 1 tablet (40 mg total) by mouth daily.    Dispense:  30 tablet    Refill:  5   allopurinol (ZYLOPRIM) 100 MG tablet    Sig: Take 1 tablet (100 mg total) by mouth daily.    Dispense:  30 tablet    Refill:  5   glucose blood (ONETOUCH VERIO) test strip    Sig: Use as instructed    Dispense:  50 strip    Refill:  1   Semaglutide-Weight Management (WEGOVY) 0.25 MG/0.5ML SOAJ    Sig: Inject 0.25 mg into the skin once a week.    Dispense:  2 mL    Refill:  1   There are no Patient Instructions on file for this visit.    Signed,   Meredith Staggers, MD [facility], [facility] 08/26/23 11:09 AM

## 2023-08-26 NOTE — Patient Instructions (Signed)
Thanks for coming in today.  I did order the low dose of Wegovy, can be started once per week, watch for the side effects as we discussed and let me know if those occur.  If that medication is not covered by insurance, still follow-up with me in 1 month and we can discuss alternate options or possibly with weight management specialist.  If any concerns on labs I will let you know but no medication changes at this time.  Take care and let me know if there are questions prior to next visit.  Preventive Care 59-53 Years Old, Male Preventive care refers to lifestyle choices and visits with your health care provider that can promote health and wellness. Preventive care visits are also called wellness exams. What can I expect for my preventive care visit? Counseling During your preventive care visit, your health care provider may ask about your: Medical history, including: Past medical problems. Family medical history. Current health, including: Emotional well-being. Home life and relationship well-being. Sexual activity. Lifestyle, including: Alcohol, nicotine or tobacco, and drug use. Access to firearms. Diet, exercise, and sleep habits. Safety issues such as seatbelt and bike helmet use. Sunscreen use. Work and work Astronomer. Physical exam Your health care provider will check your: Height and weight. These may be used to calculate your BMI (body mass index). BMI is a measurement that tells if you are at a healthy weight. Waist circumference. This measures the distance around your waistline. This measurement also tells if you are at a healthy weight and may help predict your risk of certain diseases, such as type 2 diabetes and high blood pressure. Heart rate and blood pressure. Body temperature. Skin for abnormal spots. What immunizations do I need?  Vaccines are usually given at various ages, according to a schedule. Your health care provider will recommend vaccines for you based on  your age, medical history, and lifestyle or other factors, such as travel or where you work. What tests do I need? Screening Your health care provider may recommend screening tests for certain conditions. This may include: Lipid and cholesterol levels. Diabetes screening. This is done by checking your blood sugar (glucose) after you have not eaten for a while (fasting). Hepatitis B test. Hepatitis C test. HIV (human immunodeficiency virus) test. STI (sexually transmitted infection) testing, if you are at risk. Lung cancer screening. Prostate cancer screening. Colorectal cancer screening. Talk with your health care provider about your test results, treatment options, and if necessary, the need for more tests. Follow these instructions at home: Eating and drinking  Eat a diet that includes fresh fruits and vegetables, whole grains, lean protein, and low-fat dairy products. Take vitamin and mineral supplements as recommended by your health care provider. Do not drink alcohol if your health care provider tells you not to drink. If you drink alcohol: Limit how much you have to 0-2 drinks a day. Know how much alcohol is in your drink. In the [place] one drink equals one 12 oz bottle of beer (355 mL), one 5 oz glass of wine (148 mL), or one 1 oz glass of hard liquor (44 mL). Lifestyle Brush your teeth every morning and night with fluoride toothpaste. Floss one time each day. Exercise for at least 30 minutes 5 or more days each week. Do not use any products that contain nicotine or tobacco. These products include cigarettes, chewing tobacco, and vaping devices, such as e-cigarettes. If you need help quitting, ask your health care provider. Do not use drugs.  If you are sexually active, practice safe sex. Use a condom or other form of protection to prevent STIs. Take aspirin only as told by your health care provider. Make sure that you understand how much to take and what form to take. Work with  your health care provider to find out whether it is safe and beneficial for you to take aspirin daily. Find healthy ways to manage stress, such as: Meditation, yoga, or listening to music. Journaling. Talking to a trusted person. Spending time with friends and family. Minimize exposure to UV radiation to reduce your risk of skin cancer. Safety Always wear your seat belt while driving or riding in a vehicle. Do not drive: If you have been drinking alcohol. Do not ride with someone who has been drinking. When you are tired or distracted. While texting. If you have been using any mind-altering substances or drugs. Wear a helmet and other protective equipment during sports activities. If you have firearms in your house, make sure you follow all gun safety procedures. What's next? Go to your health care provider once a year for an annual wellness visit. Ask your health care provider how often you should have your eyes and teeth checked. Stay up to date on all vaccines. This information is not intended to replace advice given to you by your health care provider. Make sure you discuss any questions you have with your health care provider. Document Revised: 02/12/2021 Document Reviewed: 02/12/2021 Elsevier Patient Education  2024 [employer].

## 2023-08-29 ENCOUNTER — Telehealth: Payer: BC Managed Care – PPO | Admitting: Family Medicine

## 2023-08-29 DIAGNOSIS — J111 Influenza due to unidentified influenza virus with other respiratory manifestations: Secondary | ICD-10-CM | POA: Diagnosis not present

## 2023-08-29 MED ORDER — OSELTAMIVIR PHOSPHATE 75 MG PO CAPS: 75.0000 mg | ORAL_CAPSULE | Freq: Two times a day (BID) | ORAL | 0 refills | Status: AC

## 2023-08-29 MED ORDER — BENZONATATE 200 MG PO CAPS
200.0000 mg | ORAL_CAPSULE | Freq: Two times a day (BID) | ORAL | 0 refills | Status: DC | PRN
Start: 2023-08-29 — End: 2024-01-06

## 2023-08-29 NOTE — Progress Notes (Signed)

## 2023-09-02 ENCOUNTER — Ambulatory Visit
Admission: EM | Admit: 2023-09-02 | Discharge: 2023-09-02 | Disposition: A | Discharge status: Home / Self Care | Payer: BC Managed Care – PPO | Attending: Physician Assistant | Admitting: Physician Assistant

## 2023-09-02 ENCOUNTER — Ambulatory Visit (INDEPENDENT_AMBULATORY_CARE_PROVIDER_SITE_OTHER): Payer: BC Managed Care – PPO

## 2023-09-02 DIAGNOSIS — R051 Acute cough: Secondary | ICD-10-CM

## 2023-09-02 DIAGNOSIS — J209 Acute bronchitis, unspecified: Secondary | ICD-10-CM

## 2023-09-02 MED ORDER — DOXYCYCLINE HYCLATE 100 MG PO CAPS: 100.0000 mg | ORAL_CAPSULE | Freq: Two times a day (BID) | ORAL | 0 refills | Status: DC

## 2023-09-02 NOTE — ED Provider Notes (Signed)
 [name]    CSN: [id number] Arrival date & time: 09/02/23  1039      History   Chief Complaint No chief complaint on file.   HPI Juan Nelson is a 58 y.o. male.   Patient complains of a cough and congestion.  Patient reports that he is currently on Tamiflu  and Tessalon  Perles.  Patient reports that his symptoms have not improved.  Patient did a televisit on 12/29.  Patient reports he has a past medical history of hypertension kidney stones and sleep apnea.  The history is provided by the patient. No language interpreter was used.    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   Primary hypertension 12/25/2020   GERD with apnea 12/25/2020   Other seasonal allergic rhinitis 03/17/2017   Hx of laparoscopic adjustable gastric banding 02/08/2017   History of kidney stones 04/18/2014   Essential hypertension 04/18/2014   LFT elevation 10/08/2012   Hyperglycemia 10/08/2012    Past Surgical History:  Procedure Laterality Date   EXTRACORPOREAL SHOCK WAVE LITHOTRIPSY Right 11/05/2016   Procedure: RIGHT EXTRACORPOREAL SHOCK WAVE LITHOTRIPSY (ESWL);  Surgeon: [name], MD;  Location: [facility];  Service: Urology;  Laterality: Right;   GANGLION CYST EXCISION  1989   LAPAROSCOPIC GASTRIC BANDING     NEVUS EXCISION Right 04/28/2017   SPINE SURGERY         Home Medications    Prior to Admission medications   Medication Sig Start Date End Date Taking?  Authorizing Provider  doxycycline  (VIBRAMYCIN ) 100 MG capsule Take 1 capsule (100 mg total) by mouth 2 (two) times daily. 09/02/23  Yes Demarko Zeimet K, PA-C  acetaminophen  (TYLENOL ) 500 MG tablet Take 500 mg by mouth every 6 (six) hours as needed for headache.    [provider]  allopurinol  (ZYLOPRIM ) 100 MG tablet Take 1 tablet (100 mg total) by mouth daily. 08/26/23   Levora Reyes SAUNDERS, MD  amLODipine  (NORVASC ) 2.5 MG tablet Take 1 tablet (2.5 mg total) by mouth daily. 08/26/23   Levora Reyes SAUNDERS, MD  aspirin EC 81 MG tablet Take 81 mg by mouth daily. Swallow whole.    [provider]  benzonatate  (TESSALON ) 200 MG capsule Take 1 capsule (200 mg total) by mouth 2 (two) times daily as needed for cough. 08/29/23   Blair, Diane W, FNP  brompheniramine-pseudoephedrine-DM 30-2-10 MG/5ML syrup Take 5 mLs by mouth 4 (four) times daily as needed. 08/08/22   Fleming, Zelda W, NP  cholecalciferol (VITAMIN D ) 1000 UNITS tablet Take 5,000 Units by mouth daily.    [provider]  colchicine  0.6 MG tablet Take two tabs once at the first sign of a gout flare and then one an hour later. Repeat in 24 hours if needed. Patient taking differently: Take 0.12 mg by mouth daily as needed (gout). Take two tabs once at the first sign of a  gout flare and then one an hour later. Repeat in 24 hours if needed. 08/16/17   Gretta Ozell CROME, PA-C  cyclobenzaprine (FLEXERIL) 10 MG tablet TAKE 1 TABLET(S) EVERY 6-8 HOURS AS NEEDED FOR SPASM 06/09/22   [provider]  diclofenac  (VOLTAREN ) 75 MG EC tablet Take 1 tablet (75 mg total) by mouth 2 (two) times daily. As needed 01/29/21   Levora Reyes SAUNDERS, MD  EPINEPHrine  (AUVI-Q ) 0.3 mg/0.3 mL IJ SOAJ injection Inject 0.3 mg into the muscle as needed for anaphylaxis. 12/29/22   Iva Marty Saltness, MD  erythromycin  ophthalmic ointment Place a 1/2 inch ribbon of ointment into the lower eyelid 4 times daily. 05/04/23   Hazen Darryle BRAVO, FNP  fluticasone   (FLONASE ) 50 MCG/ACT nasal spray Place 2 sprays into both nostrils daily. 12/29/22   Iva Marty Saltness, MD  glucose blood [facility] VERIO) test strip Use as instructed 08/26/23   Levora Reyes SAUNDERS, MD  ipratropium (ATROVENT ) 0.03 % nasal spray Place 2 sprays into both nostrils 3 (three) times daily. 12/29/22   Iva Marty Saltness, MD  lisinopril  (ZESTRIL ) 40 MG tablet Take 1 tablet (40 mg total) by mouth daily. 08/26/23   Levora Reyes SAUNDERS, MD  loratadine  (ALLERGY) 10 MG tablet Take 1 tablet (10 mg total) by mouth daily. Alegra D 12/29/22   Iva Marty Saltness, MD  Magnesium 100 MG CAPS Take by mouth.    [provider]  metFORMIN  (GLUCOPHAGE ) 500 MG tablet TAKE 1 TABLET BY MOUTH EVERY DAY WITH BREAKFAST 08/06/23   Levora Reyes SAUNDERS, MD  Multiple Vitamin (MULTIVITAMIN WITH MINERALS) TABS Take 1 tablet by mouth daily.    [provider]  omalizumab  (XOLAIR ) 150 MG injection Inject 300 mg into the skin every 28 (twenty-eight) days. 11/16/22   Iva Marty Saltness, MD  oseltamivir  (TAMIFLU ) 75 MG capsule Take 1 capsule (75 mg total) by mouth 2 (two) times daily for 5 days. 08/29/23 09/03/23  Blair, Diane W, FNP  Semaglutide -Weight Management (WEGOVY ) 0.25 MG/0.5ML SOAJ Inject 0.25 mg into the skin once a week. 08/26/23   Levora Reyes SAUNDERS, MD  tadalafil (CIALIS) 20 MG tablet Take 20 mg by mouth daily as needed for erectile dysfunction.  10/27/19   [provider]  tamsulosin  (FLOMAX ) 0.4 MG CAPS capsule TAKE 1 CAP DAILY AS NEEDED FOR SYMPTOMS OF KIDNEY STONE 03/22/23   Levora Reyes SAUNDERS, MD    Family History Family History  Problem Relation Age of Onset   Hypertension Mother    Diabetes Father    Cancer Maternal Grandmother    Depression Maternal Grandfather    Alzheimer's disease Paternal Grandmother    Cancer Paternal Grandfather     Social History Social History   Tobacco Use   Smoking status: Never   Smokeless tobacco: Never  Vaping Use   Vaping status:  Never Used  Substance Use Topics   Alcohol use: No   Drug use: No     Allergies   Other, Pumpkin seed, Sesame seed (diagnostic), Bee venom, Insect extract, and Mobic  [meloxicam ]   Review of Systems Review of Systems  Respiratory:  Positive for cough.   All other systems reviewed and are negative.    Physical Exam Triage Vital Signs ED Triage Vitals  Encounter Vitals Group     BP 09/02/23 1139 (!) 139/92     Systolic BP Percentile --      Diastolic BP Percentile --      Pulse Rate 09/02/23 1139 75  Resp 09/02/23 1139 20     Temp 09/02/23 1139 98 F (36.7 C)     Temp Source 09/02/23 1139 Oral     SpO2 09/02/23 1139 98 %     Weight 09/02/23 1139 240 lb (108.9 kg)     Height 09/02/23 1139 6' 1 (1.854 m)     Head Circumference --      Peak Flow --      Pain Score 09/02/23 1138 7     Pain Loc --      Pain Education --      Exclude from Growth Chart --    No data found.  Updated Vital Signs BP (!) 139/92 (BP Location: Left Arm)   Pulse 75   Temp 98 F (36.7 C) (Oral)   Resp 20   Ht 6' 1 (1.854 m)   Wt 108.9 kg   SpO2 98%   BMI 31.66 kg/m   Visual Acuity Right Eye Distance:   Left Eye Distance:   Bilateral Distance:    Right Eye Near:   Left Eye Near:    Bilateral Near:     Physical Exam Vitals and nursing note reviewed.  Constitutional:      Appearance: He is well-developed.  HENT:     Head: Normocephalic.     Right Ear: Tympanic membrane normal.     Left Ear: Tympanic membrane normal.     Nose: Nose normal.     Mouth/Throat:     Mouth: Mucous membranes are moist.  Cardiovascular:     Rate and Rhythm: Normal rate and regular rhythm.  Pulmonary:     Effort: Pulmonary effort is normal.  Abdominal:     General: Abdomen is flat. There is no distension.  Musculoskeletal:        General: Normal range of motion.     Cervical back: Normal range of motion.  Skin:    General: Skin is warm.  Neurological:     Mental Status: He is alert and  oriented to person, place, and time.  Psychiatric:        Mood and Affect: Mood normal.      UC Treatments / Results  Labs (all labs ordered are listed, but only abnormal results are displayed) Labs Reviewed - No data to display  EKG   Radiology DG Chest 2 View Result Date: 09/02/2023 CLINICAL DATA:  Cough. EXAM: CHEST - 2 VIEW COMPARISON:  Remote radiograph 01/03/2008 FINDINGS: The heart is normal in size. There is mild aortic tortuosity. The lungs are clear. Pulmonary vasculature is normal. No consolidation, pleural effusion, or pneumothorax. Thoracic spondylosis with anterior spurring. No acute osseous abnormalities are seen. IMPRESSION: No acute chest findings. Electronically Signed   By: Andrea Gasman M.D.   On: 09/02/2023 15:04    Procedures Procedures (including critical care time)  Medications Ordered in UC Medications - No data to display  Initial Impression / Assessment and Plan / UC Course  I have reviewed the triage vital signs and the nursing notes.  Pertinent labs & imaging results that were available during my care of the patient were reviewed by me and considered in my medical decision making (see chart for details).   Radiologist reviewed chest x-ray reports no acute findings.  Has had symptoms for over a week I reviewed chest x-ray patient has been increased markings possible bronchitis patient is given a prescription for doxycycline  he is advised to follow-up with his primary care physician for recheck if symptoms persist Final  Clinical Impressions(s) / UC Diagnoses   Final diagnoses:  Acute cough  Acute bronchitis, unspecified organism   Discharge Instructions   None    ED Prescriptions     Medication Sig Dispense Auth. Provider   doxycycline  (VIBRAMYCIN ) 100 MG capsule Take 1 capsule (100 mg total) by mouth 2 (two) times daily. 20 capsule Enoc Getter K, PA-C      PDMP not reviewed this encounter. An After Visit Summary was printed and given  to the patient.       Flint Sonny POUR, PA-C 09/02/23 1514

## 2023-09-02 NOTE — ED Triage Notes (Signed)
 Patient presents with chest congestion x 2 days, sore throat, sinus pain and pressure, cough and watery eyes x day 5. Treated with Mucinex,  Oseltamivir Phos 75mg  and Benzonatate since Sunday and symptoms has gotten worse.

## 2023-09-09 ENCOUNTER — Telehealth: Payer: Self-pay | Admitting: Pharmacist

## 2023-09-09 ENCOUNTER — Other Ambulatory Visit (HOSPITAL_COMMUNITY): Payer: Self-pay

## 2023-09-09 DIAGNOSIS — R7303 Prediabetes: Secondary | ICD-10-CM

## 2023-09-09 DIAGNOSIS — K7581 Nonalcoholic steatohepatitis (NASH): Secondary | ICD-10-CM | POA: Diagnosis not present

## 2023-09-09 NOTE — Telephone Encounter (Signed)
Is there an alternative that is covered?

## 2023-09-09 NOTE — Telephone Encounter (Signed)
 Equivalents are not covered should I check a different medication class?

## 2023-09-09 NOTE — Telephone Encounter (Signed)
 Pharmacy Patient [facility]ocate Encounter   [phone] 0.25MG /0.5ML auto-injectors is not covered by the patients current plan.

## 2023-09-09 NOTE — Telephone Encounter (Signed)
 FYI about patients non coverage

## 2023-09-09 NOTE — Telephone Encounter (Signed)
 Is there a different formulation that is covered?

## 2023-09-10 NOTE — Telephone Encounter (Signed)
 Noted.  Please advise patient that it appears he does not have coverage for [facility] or similar medications.  Would he like to meet with a weight loss specialist?  Let me know if there are questions.

## 2023-09-10 NOTE — Telephone Encounter (Signed)
 Pt has been made aware, he said he would like to meet with weight loss and see if that is something eh feels would work well for him.  I have placed the referral to weight management clinic.

## 2023-09-10 NOTE — Addendum Note (Signed)
 Addended by: Eldred Manges on: 09/10/2023 01:20 PM   Modules accepted: Orders

## 2023-09-27 ENCOUNTER — Ambulatory Visit: Payer: BC Managed Care – PPO | Admitting: Family Medicine

## 2023-10-02 ENCOUNTER — Other Ambulatory Visit: Payer: Self-pay | Admitting: Allergy & Immunology

## 2023-10-04 ENCOUNTER — Ambulatory Visit: Payer: Self-pay | Admitting: *Deleted

## 2023-10-04 DIAGNOSIS — L501 Idiopathic urticaria: Secondary | ICD-10-CM

## 2023-10-11 DIAGNOSIS — H2513 Age-related nuclear cataract, bilateral: Secondary | ICD-10-CM | POA: Diagnosis not present

## 2023-10-11 DIAGNOSIS — E119 Type 2 diabetes mellitus without complications: Secondary | ICD-10-CM | POA: Diagnosis not present

## 2023-10-18 ENCOUNTER — Other Ambulatory Visit: Payer: Self-pay | Admitting: Allergy & Immunology

## 2023-10-30 IMAGING — DX DG LUMBAR SPINE COMPLETE 4+V
5 series · 5 of 5 positions shown · non-contrast
Comparison: Radiograph dated October 17, 2009

CLINICAL DATA: Low back pain

EXAM:
LUMBAR SPINE - COMPLETE 4+ VIEW

[l-spine ap]
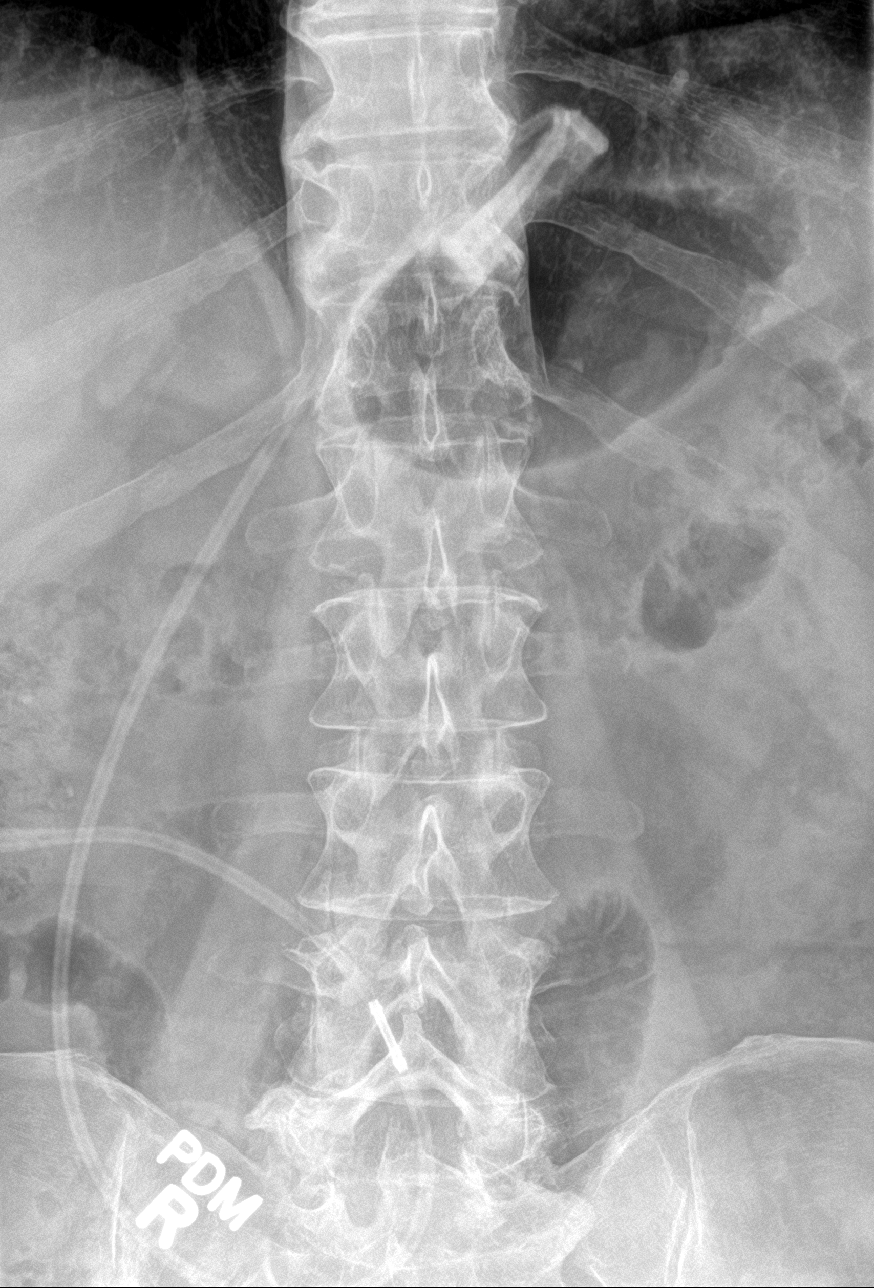

[l-spine obl (1 of 2)]
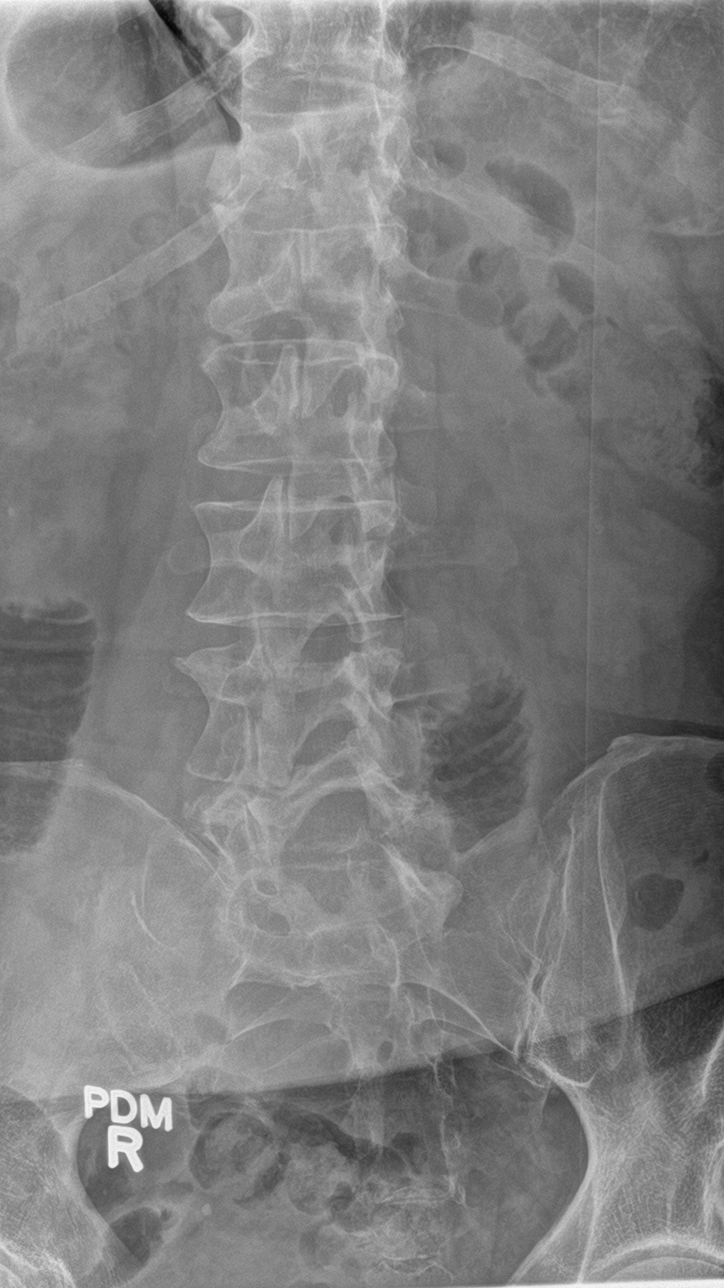

[l-spine obl (2 of 2)]
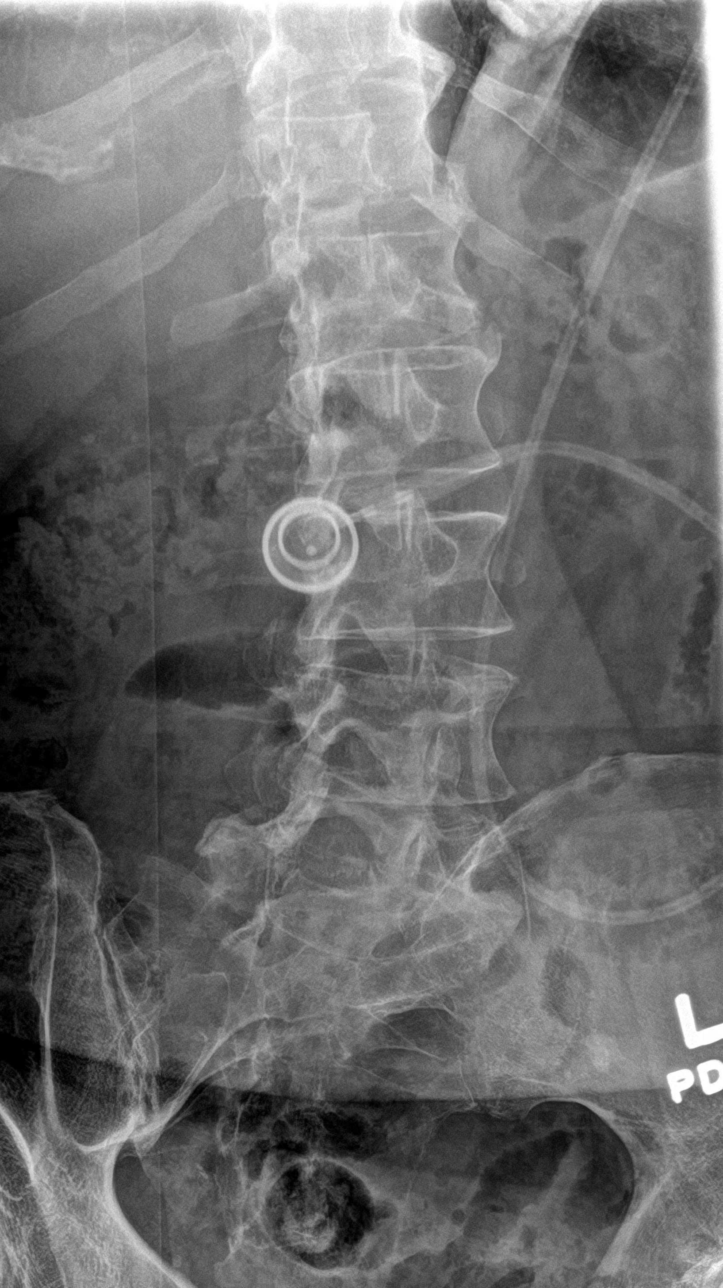

[l-spine lat]
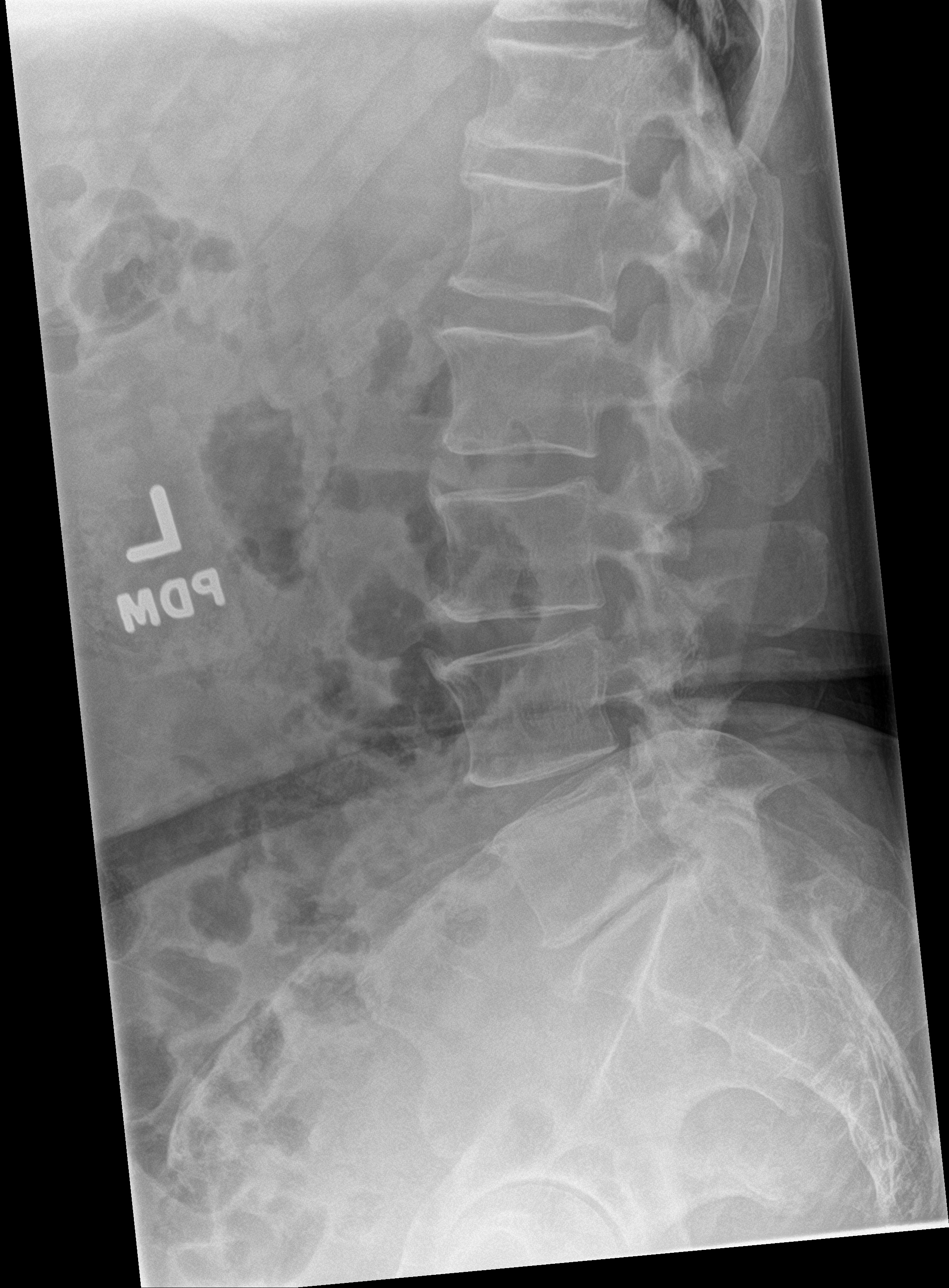

[l-spine spot]
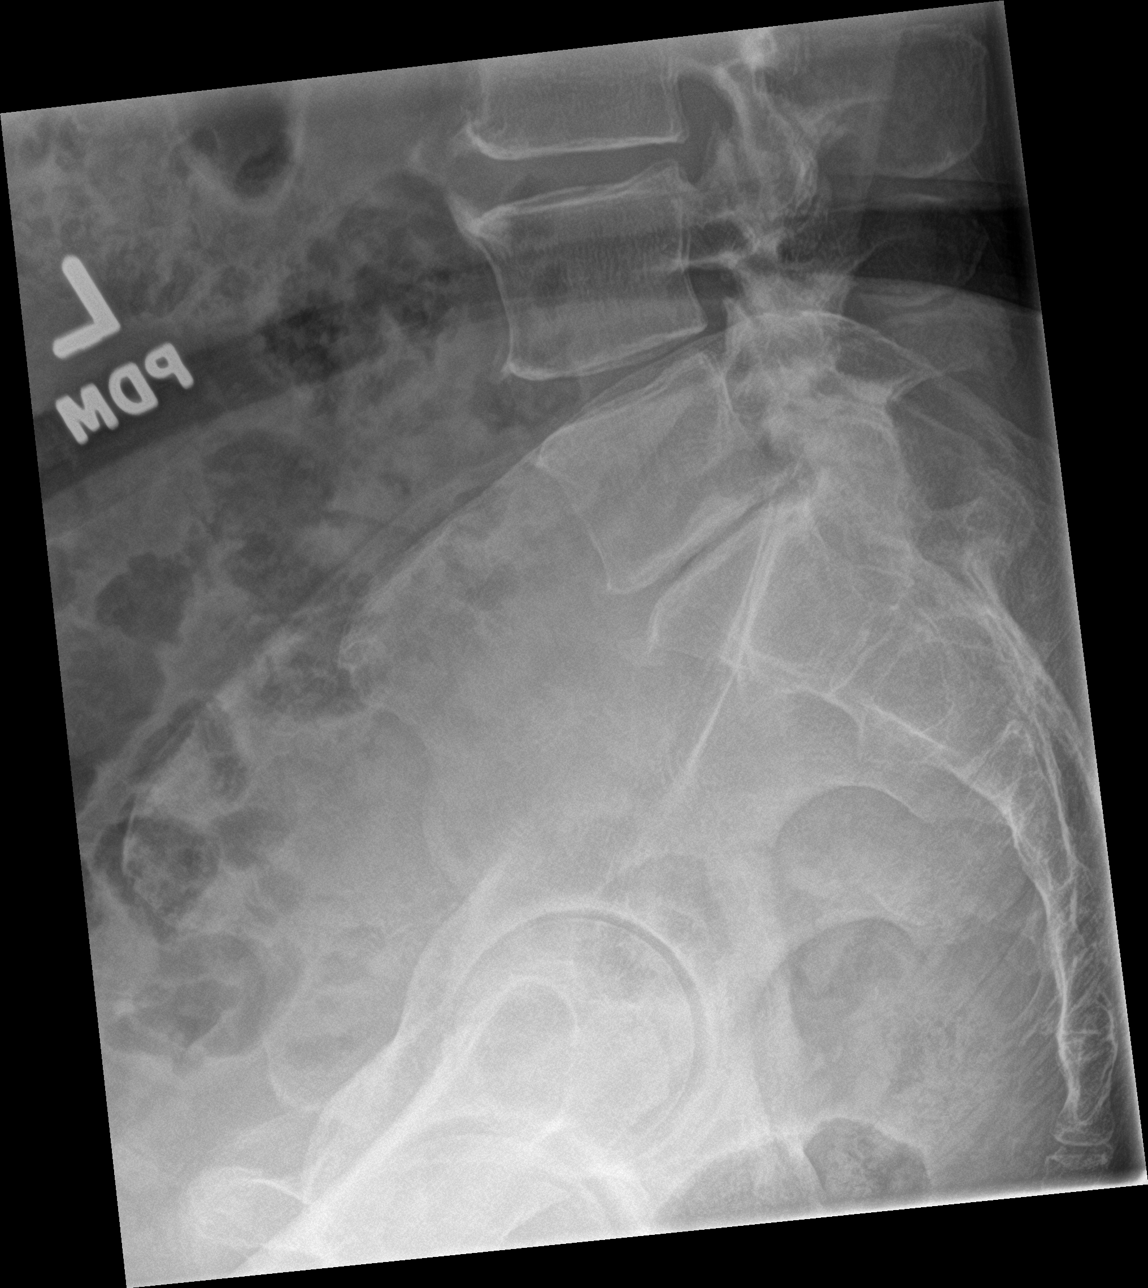

[5 of 5 positions shown; findings below may reference images not displayed]

FINDINGS: There is no evidence of lumbar spine fracture. Alignment is normal.
Multilevel degenerative disc disease, most severe at L5-S1 where
there is moderate disc space height loss. Moderate facet arthropathy
of the lower lumbar spine.
IMPRESSION: Moderate degenerative changes, increased when compared with 3699
prior radiograph.

## 2023-11-11 ENCOUNTER — Encounter: Payer: Self-pay | Admitting: Family Medicine

## 2023-11-11 DIAGNOSIS — G4733 Obstructive sleep apnea (adult) (pediatric): Secondary | ICD-10-CM | POA: Diagnosis not present

## 2023-11-15 ENCOUNTER — Ambulatory Visit: Payer: Self-pay | Admitting: *Deleted

## 2023-11-15 DIAGNOSIS — L501 Idiopathic urticaria: Secondary | ICD-10-CM

## 2023-11-28 ENCOUNTER — Other Ambulatory Visit: Payer: Self-pay | Admitting: Family Medicine

## 2023-11-28 DIAGNOSIS — R7303 Prediabetes: Secondary | ICD-10-CM

## 2023-12-20 ENCOUNTER — Ambulatory Visit
Admission: EM | Admit: 2023-12-20 | Discharge: 2023-12-20 | Disposition: A | Discharge status: Home / Self Care | Attending: Family Medicine | Admitting: Family Medicine

## 2023-12-20 DIAGNOSIS — R252 Cramp and spasm: Secondary | ICD-10-CM | POA: Diagnosis not present

## 2023-12-20 DIAGNOSIS — R438 Other disturbances of smell and taste: Secondary | ICD-10-CM

## 2023-12-20 LAB — POCT FASTING CBG KUC MANUAL ENTRY: POCT Glucose (KUC): 114 mg/dL — AB (ref 70–99)

## 2023-12-20 NOTE — ED Triage Notes (Signed)
"  I have this horrible taste in my mouth, for the last 3 days". "I also have had some horrible leg cramps the last 3 days". No fever. No cold symptoms. No dental abscess or infection known. Re: legs "no injury known".

## 2023-12-20 NOTE — ED Provider Notes (Signed)
 [name]    CSN: [id number] Arrival date & time: 12/20/23  1050      History   Chief Complaint Chief Complaint  Patient presents with   Leg Pain   Oral Problem    HPI Juan Nelson is a 58 y.o. male.    Leg Pain  Patient is here for several issues.  He has had a bad taste in his mouth x 3 days.  No other URI symptoms.  No changes in toothpaste or what he is eating/drinking.  No increased thirst or urination.  No issues with gerd.    He also feels he has bad breath.   He is trying to drink a lot to stay hydrated.   Last night he had severe leg cramps in both legs.  Sometimes in the foot, upper legs, lower legs.  Not the calves.  He would get up, walk around with help, but then they would come back.   He did have labs in December, all pretty normal.  No changes in medications, rx or otc.        Past Medical History:  Diagnosis Date   Allergy    Arthritis    right knee   GERD (gastroesophageal reflux disease)    History of kidney stones    multople stones    Hypertension    Kidney stone    Sleep apnea    Urticaria     Patient Active Problem List   Diagnosis Date Noted   Sensorineural hearing loss, bilateral 07/04/2023   Other specified disorders of eustachian tube, bilateral 07/04/2023   Chronic rhinitis 07/04/2023   Impacted cerumen of both ears 07/04/2023   Left eye pain 05/02/2023   Unspecified rotator cuff tear or rupture of left shoulder, not specified as traumatic 05/21/2022   OSA on CPAP 12/25/2020   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   Primary hypertension 12/25/2020   GERD with apnea 12/25/2020   Other seasonal allergic rhinitis 03/17/2017   Hx of laparoscopic adjustable gastric banding 02/08/2017   History of kidney stones 04/18/2014   Essential hypertension 04/18/2014   LFT elevation 10/08/2012   Hyperglycemia 10/08/2012    Past Surgical History:  Procedure Laterality Date   EXTRACORPOREAL SHOCK WAVE LITHOTRIPSY  Right 11/05/2016   Procedure: RIGHT EXTRACORPOREAL SHOCK WAVE LITHOTRIPSY (ESWL);  Surgeon: Marco Severs, MD;  Location: [facility];  Service: Urology;  Laterality: Right;   GANGLION CYST EXCISION  1989   LAPAROSCOPIC GASTRIC BANDING     NEVUS EXCISION Right 04/28/2017   SPINE SURGERY         Home Medications    Prior to Admission medications   Medication Sig Start Date End Date Taking? Authorizing Provider  allopurinol  (ZYLOPRIM ) 100 MG tablet Take 1 tablet (100 mg total) by mouth daily. 08/26/23  Yes Benjiman Bras, MD  amLODipine  (NORVASC ) 2.5 MG tablet Take 1 tablet (2.5 mg total) by mouth daily. 08/26/23  Yes Benjiman Bras, MD  aspirin EC 81 MG tablet Take 81 mg by mouth daily. Swallow whole.   Yes [provider]  lisinopril  (ZESTRIL ) 40 MG tablet Take 1 tablet (40 mg total) by mouth daily. 08/26/23  Yes Benjiman Bras, MD  metFORMIN  (GLUCOPHAGE ) 500 MG tablet TAKE 1 TABLET BY MOUTH EVERY DAY WITH BREAKFAST 11/29/23  Yes Benjiman Bras, MD  acetaminophen  (TYLENOL ) 500 MG tablet Take 500 mg by mouth every 6 (six) hours as needed for headache.    [provider]  benzonatate  (TESSALON ) 200 MG capsule Take 1 capsule (200 mg total) by mouth 2 (two) times daily as needed for cough. 08/29/23   Blair, Diane W, FNP  brompheniramine-pseudoephedrine-DM 30-2-10 MG/5ML syrup Take 5 mLs by mouth 4 (four) times daily as needed. 08/08/22   Fleming, Zelda W, NP  calcium citrate (CALCITRATE - DOSED IN MG ELEMENTAL CALCIUM) 950 (200 Ca) MG tablet Take 200 mg of elemental calcium by mouth as directed.    [provider]  cholecalciferol (VITAMIN D ) 1000 UNITS tablet Take 5,000 Units by mouth daily.    [provider]  colchicine  0.6 MG tablet Take two tabs once at the first sign of a gout flare and then one an hour later. Repeat in 24 hours if needed. Patient taking differently: Take 0.12 mg by mouth daily as needed (gout). Take two tabs once at the first  sign of a gout flare and then one an hour later. Repeat in 24 hours if needed. 08/16/17   Bettie Bruce, PA-C  cyclobenzaprine (FLEXERIL) 10 MG tablet TAKE 1 TABLET(S) EVERY 6-8 HOURS AS NEEDED FOR SPASM 06/09/22   [provider]  diclofenac  (VOLTAREN ) 75 MG EC tablet Take 1 tablet (75 mg total) by mouth 2 (two) times daily. As needed 01/29/21   Benjiman Bras, MD  doxycycline  (VIBRAMYCIN ) 100 MG capsule Take 1 capsule (100 mg total) by mouth 2 (two) times daily. 09/02/23   Sofia, Leslie K, PA-C  EPINEPHrine  (AUVI-Q ) 0.3 mg/0.3 mL IJ SOAJ injection Inject 0.3 mg into the muscle as needed for anaphylaxis. 12/29/22   Rochester Chuck, MD  erythromycin  ophthalmic ointment Place a 1/2 inch ribbon of ointment into the lower eyelid 4 times daily. 05/04/23   Dodson Freestone, FNP  fluticasone  (FLONASE ) 50 MCG/ACT nasal spray SPRAY 2 SPRAYS INTO EACH NOSTRIL EVERY DAY 10/04/23   Rochester Chuck, MD  glucose blood [facility] VERIO) test strip Use as instructed 08/26/23   Benjiman Bras, MD  ipratropium (ATROVENT ) 0.03 % nasal spray Place 2 sprays into both nostrils 3 (three) times daily. 12/29/22   Rochester Chuck, MD  loratadine  (ALLERGY) 10 MG tablet Take 1 tablet (10 mg total) by mouth daily. Alegra D 12/29/22   Rochester Chuck, MD  Magnesium 100 MG CAPS Take by mouth.    [provider]  Multiple Vitamin (MULTIVITAMIN WITH MINERALS) TABS Take 1 tablet by mouth daily.    [provider]  tadalafil (CIALIS) 20 MG tablet Take 20 mg by mouth daily as needed for erectile dysfunction.  10/27/19   [provider]  tamsulosin  (FLOMAX ) 0.4 MG CAPS capsule TAKE 1 CAP DAILY AS NEEDED FOR SYMPTOMS OF KIDNEY STONE 03/22/23   Benjiman Bras, MD  XOLAIR  150 MG injection INJECT 300 MG UNDER THE SKIN EVERY 28 DAYS (MAY REQUIRE RECONSTITUTION/DILUTION REFER TO PACKAGE INSERT OR MEDICAL ORDER) 10/18/23   Rochester Chuck, MD    Family History Family History   Problem Relation Age of Onset   Hypertension Mother    Diabetes Father    Cancer Maternal Grandmother    Depression Maternal Grandfather    Alzheimer's disease Paternal Grandmother    Cancer Paternal Grandfather     Social History Social History   Tobacco Use   Smoking status: Never   Smokeless tobacco: Never  Vaping Use   Vaping status: Never Used  Substance Use Topics   Alcohol use: No   Drug use: No     Allergies  Other, Pumpkin seed, Sesame seed (diagnostic), Bee venom, Insect extract, Mobic  [meloxicam ], and Sesame oil   Review of Systems Review of Systems  Constitutional: Negative.   HENT: Negative.    Respiratory: Negative.    Cardiovascular: Negative.   Gastrointestinal: Negative.   Musculoskeletal:  Positive for myalgias.     Physical Exam Triage Vital Signs ED Triage Vitals  Encounter Vitals Group     BP 12/20/23 1137 110/73     Systolic BP Percentile --      Diastolic BP Percentile --      Pulse Rate 12/20/23 1137 71     Resp 12/20/23 1137 18     Temp 12/20/23 1137 98.4 F (36.9 C)     Temp Source 12/20/23 1137 Oral     SpO2 12/20/23 1137 96 %     Weight 12/20/23 1134 235 lb (106.6 kg)     Height 12/20/23 1134 6\' 1"  (1.854 m)     Head Circumference --      Peak Flow --      Pain Score 12/20/23 1131 2     Pain Loc --      Pain Education --      Exclude from Growth Chart --    No data found.  Updated Vital Signs BP 110/73 (BP Location: Left Arm)   Pulse 71   Temp 98.4 F (36.9 C) (Oral)   Resp 18   Ht 6\' 1"  (1.854 m)   Wt 106.6 kg   SpO2 96%   BMI 31.00 kg/m   Visual Acuity Right Eye Distance:   Left Eye Distance:   Bilateral Distance:    Right Eye Near:   Left Eye Near:    Bilateral Near:     Physical Exam Constitutional:      General: He is not in acute distress.    Appearance: Normal appearance. He is normal weight. He is ill-appearing. He is not toxic-appearing.  HENT:     Head: Normocephalic.     Nose: Nose  normal.     Mouth/Throat:     Mouth: Mucous membranes are moist.     Dentition: Normal dentition. Does not have dentures. No dental tenderness, gingival swelling or dental caries.     Tongue: No lesions.     Palate: No lesions.     Pharynx: No oropharyngeal exudate or posterior oropharyngeal erythema.  Cardiovascular:     Rate and Rhythm: Normal rate and regular rhythm.  Pulmonary:     Effort: Pulmonary effort is normal.     Breath sounds: Normal breath sounds.  Musculoskeletal:        General: No swelling, tenderness, deformity or signs of injury. Normal range of motion.     Cervical back: Normal range of motion and neck supple. No tenderness.  Lymphadenopathy:     Cervical: No cervical adenopathy.  Skin:    General: Skin is warm.  Neurological:     General: No focal deficit present.     Mental Status: He is alert and oriented to person, place, and time.  Psychiatric:        Mood and Affect: Mood normal.      UC Treatments / Results  Labs (all labs ordered are listed, but only abnormal results are displayed) Labs Reviewed  POCT FASTING CBG [facility] MANUAL ENTRY - Abnormal; Notable for the following components:      Result Value   POCT Glucose ([facility]) 114 (*)    All other components within normal limits  COMPREHENSIVE METABOLIC PANEL WITH GFR  CBC WITH DIFFERENTIAL/PLATELET  MAGNESIUM    EKG   Radiology No results found.  Procedures Procedures (including critical care time)  Medications Ordered in UC Medications - No data to display  Initial Impression / Assessment and Plan / UC Course  I have reviewed the triage vital signs and the nursing notes.  Pertinent labs & imaging results that were available during my care of the patient were reviewed by me and considered in my medical decision making (see chart for details).    Final Clinical Impressions(s) / UC Diagnoses   Final diagnoses:  Bad oral taste  Muscle cramping     Discharge Instructions      You  were seen today for several issues.  Your blood sugar was slightly elevated today, but I don't think this is the cause for symptoms currently.    I have ordered further blood work for evaluation.  This should be resulted tomorrow and if anything is abnormal we will call to notify you.  You will see these results on mychart.  Please get plenty of rest and water.  Follow up with your primary care provider if you symptoms continue.     ED Prescriptions   None    PDMP not reviewed this encounter.   Lesle Ras, MD 12/20/23 1157

## 2023-12-20 NOTE — Discharge Instructions (Addendum)
 You were seen today for several issues.  Your blood sugar was slightly elevated today, but I don't think this is the cause for symptoms currently.    I have ordered further blood work for evaluation.  This should be resulted tomorrow and if anything is abnormal we will call to notify you.  You will see these results on mychart.  Please get plenty of rest and water.  Follow up with your primary care provider if you symptoms continue.

## 2023-12-21 LAB — CBC WITH DIFFERENTIAL/PLATELET
Basophils Absolute: 0 10*3/uL (ref 0.0–0.2)
Basos: 0 %
EOS (ABSOLUTE): 0.2 10*3/uL (ref 0.0–0.4)
Eos: 4 %
Hematocrit: 39.2 % (ref 37.5–51.0)
Hemoglobin: 13.5 g/dL (ref 13.0–17.7)
Immature Grans (Abs): 0 10*3/uL (ref 0.0–0.1)
Immature Granulocytes: 0 %
Lymphocytes Absolute: 1.4 10*3/uL (ref 0.7–3.1)
Lymphs: 30 %
MCH: 30.1 pg (ref 26.6–33.0)
MCHC: 34.4 g/dL (ref 31.5–35.7)
MCV: 88 fL (ref 79–97)
Monocytes Absolute: 0.4 10*3/uL (ref 0.1–0.9)
Monocytes: 9 %
Neutrophils Absolute: 2.7 10*3/uL (ref 1.4–7.0)
Neutrophils: 57 %
Platelets: 183 10*3/uL (ref 150–450)
RBC: 4.48 x10E6/uL (ref 4.14–5.80)
RDW: 13.6 % (ref 11.6–15.4)
WBC: 4.7 10*3/uL (ref 3.4–10.8)

## 2023-12-21 LAB — COMPREHENSIVE METABOLIC PANEL WITH GFR
ALT: 38 IU/L (ref 0–44)
AST: 27 IU/L (ref 0–40)
Albumin: 4.5 g/dL (ref 3.8–4.9)
Alkaline Phosphatase: 108 IU/L (ref 44–121)
BUN/Creatinine Ratio: 15 (ref 9–20)
BUN: 13 mg/dL (ref 6–24)
Bilirubin Total: 0.5 mg/dL (ref 0.0–1.2)
CO2: 23 mmol/L (ref 20–29)
Calcium: 9 mg/dL (ref 8.7–10.2)
Chloride: 106 mmol/L (ref 96–106)
Creatinine, Ser: 0.85 mg/dL (ref 0.76–1.27)
Globulin, Total: 2 g/dL (ref 1.5–4.5)
Glucose: 108 mg/dL — ABNORMAL HIGH (ref 70–99)
Potassium: 4.4 mmol/L (ref 3.5–5.2)
Sodium: 143 mmol/L (ref 134–144)
Total Protein: 6.5 g/dL (ref 6.0–8.5)
eGFR: 100 mL/min/{1.73_m2} (ref >59)

## 2023-12-21 LAB — MAGNESIUM: Magnesium: 2 mg/dL (ref 1.6–2.3)

## 2023-12-27 ENCOUNTER — Ambulatory Visit

## 2023-12-28 ENCOUNTER — Ambulatory Visit: Payer: BC Managed Care – PPO | Admitting: Allergy & Immunology

## 2023-12-28 ENCOUNTER — Ambulatory Visit

## 2024-01-06 ENCOUNTER — Ambulatory Visit: Admitting: Allergy & Immunology

## 2024-01-06 ENCOUNTER — Encounter: Payer: Self-pay | Admitting: Allergy & Immunology

## 2024-01-06 ENCOUNTER — Other Ambulatory Visit: Payer: Self-pay

## 2024-01-06 ENCOUNTER — Ambulatory Visit

## 2024-01-06 VITALS — BP 126/68 | HR 86 | Temp 98.0°F | Wt 235.1 lb

## 2024-01-06 DIAGNOSIS — L501 Idiopathic urticaria: Secondary | ICD-10-CM | POA: Diagnosis not present

## 2024-01-06 DIAGNOSIS — R6889 Other general symptoms and signs: Secondary | ICD-10-CM | POA: Diagnosis not present

## 2024-01-06 NOTE — Progress Notes (Signed)
 FOLLOW UP  Date of Service/Encounter:  01/06/24   Assessment:   Chronic idiopathic urticaria  Seasonal and perennial allergic rhinitis (trees, cats)  Sneezing with chips or bread  Plan/Recommendations:   1. 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 - EpiPen  is up to date. - Continue to avoid all of your triggering symptoms.  2. Seasonal allergic rhinitis (trees and cats) - Continue with antihistamines as needed.  - Continue with ipratropium nasal spray at night to see how things go.   3. Return in about 1 year (around 01/05/2025). You can have the follow up appointment with Dr. Idolina Maker or a Nurse Practicioner (our Nurse Practitioners are excellent and always have Physician oversight!).    Subjective:   Juan Nelson is a 58 y.o. 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  Discussed the use of AI scribe software for clinical note transcription with the patient and/or guardian, who gave verbal consent to  proceed.  Juan Nelson is a 58 y.o. male presenting for a follow up visit.  He was last seen in April 2024.  At that time, we continue with Xolair  every 6 weeks as well as an EpiPen  as needed.  For his rhinitis, we did add on ipratropium nasal spray at night to see if that would help with his postnasal drip.  Since last visit, he has done very well.  He is currently receiving Xolair  injections every six weeks without adverse reactions. He experiences sneezing after consuming bread or chips, which he finds unusual but not concerning. He is contemplating adjusting the frequency of his Xolair  injections. He avoids meat due to his vegetarian diet and seeds due to allergies but tolerates oils. He has not needed to use an EpiPen  recently and requests a new one as a precaution.  He has a history of gout, which has been well-managed with allopurinol , and he has not experienced a recent episode.  He recalls a past incident of a bad oral taste, which resolved after using a water pick to remove a piece of food lodged in his teeth.  Apparently this piece of food had been in there several days and was rather rancid.  He experienced corneal irritation in the past, but an eye specialist confirmed it was a normal growth and not serious.  His mother moved in with him in October due to her mobility issues, and he is her primary caregiver.  She was previously living in [place], but is now living here in [place] with him.  Otherwise, there have been no changes to his past medical history, surgical history, family history, or social history.    Review of systems otherwise negative other than that mentioned in the HPI.    Objective:   Blood pressure 126/68, pulse 86, temperature 98 F (36.7 C), temperature source Temporal, weight 235 lb 1.6 oz (106.6 kg), SpO2 96%. Body mass index is 31.02 kg/m.    Physical Exam Vitals reviewed.  Constitutional:      Appearance: He is well-developed.     Comments:  Pleasant male.  Cooperative with the exam.  HENT:     Head: Normocephalic and atraumatic.     Right Ear: Tympanic membrane, ear canal and external ear normal.     Left Ear: Tympanic membrane, ear canal and external ear normal.     Nose: No nasal deformity, septal deviation, mucosal edema or rhinorrhea.     Right Turbinates: Enlarged, swollen and pale.     Left Turbinates: Enlarged, swollen and pale.     Right Sinus: No maxillary sinus tenderness or frontal sinus tenderness.     Left Sinus: No maxillary sinus tenderness or frontal sinus tenderness.     Comments: No polyps.    Mouth/Throat:     Mouth: Mucous membranes are not pale and not dry.     Pharynx: Uvula midline.  Eyes:     General:        Right eye: No discharge.        Left eye: No discharge.     Conjunctiva/sclera: Conjunctivae normal.     Right eye: Right conjunctiva is not injected. No chemosis.    Left eye: Left conjunctiva is not injected. No chemosis.    Pupils: Pupils are equal, round, and reactive to light.  Cardiovascular:     Rate and Rhythm: Normal rate and regular rhythm.     Heart sounds: Normal heart sounds.  Pulmonary:     Effort: Pulmonary effort is normal. No tachypnea, accessory muscle usage or respiratory distress.     Breath sounds: Normal breath sounds. No wheezing, rhonchi or rales.     Comments: Moving air well in all lung fields. Chest:     Chest wall: No tenderness.  Lymphadenopathy:     Cervical: No cervical adenopathy.  Skin:    Coloration: Skin is not pale.     Findings: No abrasion, erythema, petechiae or rash. Rash is not papular, urticarial or vesicular.  Neurological:     Mental Status: He is alert.  Psychiatric:        Behavior: Behavior is cooperative.      Diagnostic studies: none       Drexel Gentles, MD  [facility] 

## 2024-01-06 NOTE — Patient Instructions (Addendum)
 1. 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 - EpiPen  is up to date. - Continue to avoid all of your triggering symptoms.  2. Seasonal allergic rhinitis (trees and cats) - Continue with antihistamines as needed.  - Continue with ipratropium nasal spray at night to see how things go.   3. Return in about 1 year (around 01/05/2025). You can have the follow up appointment with Dr. Idolina Maker or a Nurse Practicioner (our Nurse Practitioners are excellent and always have Physician oversight!).    Please inform us  of any Emergency Department visits, hospitalizations, or changes in symptoms. Call us  before going to the ED for breathing or allergy symptoms since we might be able to fit you in for a sick visit. Feel free to contact us  anytime with any questions, problems, or concerns.  It was a pleasure to see you again today!  Websites that have reliable patient information: 1. American Academy of Asthma, Allergy, and Immunology: www.aaaai.org 2. Food Allergy Research and Education (FARE): foodallergy.org 3. Mothers of Asthmatics: http://www.asthmacommunitynetwork.org 4. American College of Allergy, Asthma, and Immunology: www.acaai.org      "Like" us  on Facebook and Instagram for our latest updates!      A healthy democracy works best when [employer] participate! Make sure you are registered to vote! If you have moved or changed any of your contact information, you will need to get this updated before voting! Scan the QR codes below to learn more!

## 2024-01-07 DIAGNOSIS — N200 Calculus of kidney: Secondary | ICD-10-CM | POA: Diagnosis not present

## 2024-01-07 DIAGNOSIS — I10 Essential (primary) hypertension: Secondary | ICD-10-CM | POA: Diagnosis not present

## 2024-01-07 DIAGNOSIS — E559 Vitamin D deficiency, unspecified: Secondary | ICD-10-CM | POA: Diagnosis not present

## 2024-01-10 DIAGNOSIS — I10 Essential (primary) hypertension: Secondary | ICD-10-CM | POA: Diagnosis not present

## 2024-01-10 DIAGNOSIS — D649 Anemia, unspecified: Secondary | ICD-10-CM | POA: Diagnosis not present

## 2024-01-10 DIAGNOSIS — E559 Vitamin D deficiency, unspecified: Secondary | ICD-10-CM | POA: Diagnosis not present

## 2024-01-11 ENCOUNTER — Encounter: Payer: Self-pay | Admitting: Allergy & Immunology

## 2024-01-11 DIAGNOSIS — G4733 Obstructive sleep apnea (adult) (pediatric): Secondary | ICD-10-CM | POA: Diagnosis not present

## 2024-01-17 ENCOUNTER — Other Ambulatory Visit: Payer: Self-pay | Admitting: Allergy & Immunology

## 2024-01-20 ENCOUNTER — Other Ambulatory Visit: Payer: Self-pay | Admitting: Allergy & Immunology

## 2024-02-09 DIAGNOSIS — G4733 Obstructive sleep apnea (adult) (pediatric): Secondary | ICD-10-CM | POA: Diagnosis not present

## 2024-02-15 DIAGNOSIS — G4733 Obstructive sleep apnea (adult) (pediatric): Secondary | ICD-10-CM | POA: Diagnosis not present

## 2024-02-17 ENCOUNTER — Ambulatory Visit

## 2024-02-17 DIAGNOSIS — L501 Idiopathic urticaria: Secondary | ICD-10-CM | POA: Diagnosis not present

## 2024-02-25 ENCOUNTER — Ambulatory Visit
Admission: RE | Admit: 2024-02-25 | Discharge: 2024-02-25 | Disposition: A | Discharge status: Home / Self Care | Source: Ambulatory Visit | Attending: Family Medicine | Admitting: Family Medicine

## 2024-02-25 VITALS — BP 128/87 | HR 79 | Temp 98.1°F | Resp 18 | Wt 235.0 lb

## 2024-02-25 DIAGNOSIS — R3915 Urgency of urination: Secondary | ICD-10-CM | POA: Insufficient documentation

## 2024-02-25 DIAGNOSIS — R39198 Other difficulties with micturition: Secondary | ICD-10-CM | POA: Insufficient documentation

## 2024-02-25 LAB — POCT URINALYSIS DIP (MANUAL ENTRY)
Bilirubin, UA: NEGATIVE
Blood, UA: NEGATIVE
Glucose, UA: NEGATIVE mg/dL
Ketones, POC UA: NEGATIVE mg/dL
Nitrite, UA: NEGATIVE
Protein Ur, POC: NEGATIVE mg/dL
Spec Grav, UA: 1.02 (ref 1.010–1.025)
Urobilinogen, UA: 0.2 U/dL
pH, UA: 5.5 (ref 5.0–8.0)

## 2024-02-25 MED ORDER — TAMSULOSIN HCL 0.4 MG PO CAPS: 0.4000 mg | ORAL_CAPSULE | Freq: Every day | ORAL | 0 refills | Status: AC

## 2024-02-25 NOTE — Discharge Instructions (Addendum)
 Restart tamsulosin  once daily. Make sure you hydrate very well with plain water and a quantity of 80 ounces of water a day.  Please limit drinks that are considered urinary irritants such as soda, sweet tea, coffee, energy drinks, alcohol.  These can worsen your urinary and genital symptoms but also be the source of them.  I will let you know about your urine culture results through MyChart to see if we need to prescribe or change your antibiotics based off of those results. Follow up with your urologist asap.

## 2024-02-25 NOTE — ED Triage Notes (Signed)
 Minor, recurring pain in lower urinary  tract and surrounding area. Feels like an obstruction is present, but not painful like a stone. - Entered by patient  Pt states, the urine does come but it just seems like something is in the way. I don't have a better way to explain it.

## 2024-02-25 NOTE — ED Provider Notes (Signed)
 [facility]  Note:  This document was prepared using Conservation officer, historic buildings and may include unintentional dictation errors.  MRN: [id number] DOB: Mar 28, 1966  Subjective:   Juan Nelson is a 58 y.o. male presenting for several day history of urinary frequency, urinary hesitation and urgency, slight decrease in urine stream.  Denies dysuria, hematuria, penile discharge, penile swelling, testicular pain, testicular swelling, anal pain, groin pain, rashes.  Has previously had to see an urologist for kidney stones but this feels very different for the patient as he has no pain.  Tries to hydrate well.  Has previously taken tamsulosin  but is not on this now.  No history that is known for BPH.   Current Facility-Administered Medications:    omalizumab  (XOLAIR ) injection 300 mg, 300 mg, Subcutaneous, Q28 days, Juan Danita Macintosh, MD, 300 mg at 02/17/24 9152  Current Outpatient Medications:    acetaminophen  (TYLENOL ) 500 MG tablet, Take 500 mg by mouth every 6 (six) hours as needed for headache., Disp: , Rfl:    allopurinol  (ZYLOPRIM ) 100 MG tablet, Take 1 tablet (100 mg total) by mouth daily., Disp: 30 tablet, Rfl: 5   amLODipine  (NORVASC ) 2.5 MG tablet, Take 1 tablet (2.5 mg total) by mouth daily., Disp: 30 tablet, Rfl: 5   aspirin EC 81 MG tablet, Take 81 mg by mouth daily. Swallow whole., Disp: , Rfl:    cholecalciferol (VITAMIN D ) 1000 UNITS tablet, Take 5,000 Units by mouth daily., Disp: , Rfl:    colchicine  0.6 MG tablet, Take two tabs once at the first sign of a gout flare and then one an hour later. Repeat in 24 hours if needed. (Patient taking differently: Take 0.12 mg by mouth daily as needed (gout). Take two tabs once at the first sign of a gout flare and then one an hour later. Repeat in 24 hours if needed.), Disp: 12 tablet, Rfl: 1   cyclobenzaprine (FLEXERIL) 10 MG tablet, TAKE 1 TABLET(S) EVERY 6-8 HOURS AS NEEDED FOR SPASM, Disp: ,  Rfl:    diclofenac  (VOLTAREN ) 75 MG EC tablet, Take 1 tablet (75 mg total) by mouth 2 (two) times daily. As needed, Disp: 180 tablet, Rfl: 0   EPINEPHrine  (AUVI-Q ) 0.3 mg/0.3 mL IJ SOAJ injection, Inject 0.3 mg into the muscle as needed for anaphylaxis., Disp: 2 each, Rfl: 2   fluticasone  (FLONASE ) 50 MCG/ACT nasal spray, SPRAY 2 SPRAYS INTO EACH NOSTRIL EVERY DAY, Disp: 16 mL, Rfl: 1   glucose blood (ONETOUCH VERIO) test strip, Use as instructed, Disp: 50 strip, Rfl: 1   ipratropium (ATROVENT ) 0.03 % nasal spray, PLACE 2 SPRAYS INTO BOTH NOSTRILS 3 (THREE) TIMES DAILY, Disp: 30 mL, Rfl: 12   lisinopril  (ZESTRIL ) 40 MG tablet, Take 1 tablet (40 mg total) by mouth daily., Disp: 30 tablet, Rfl: 5   loratadine  (ALLERGY) 10 MG tablet, Take 1 tablet (10 mg total) by mouth daily. Alegra D, Disp: 90 tablet, Rfl: 1   metFORMIN  (GLUCOPHAGE ) 500 MG tablet, TAKE 1 TABLET BY MOUTH EVERY DAY WITH BREAKFAST, Disp: 30 tablet, Rfl: 3   Multiple Vitamin (MULTIVITAMIN WITH MINERALS) TABS, Take 1 tablet by mouth daily., Disp: , Rfl:    tadalafil (CIALIS) 20 MG tablet, Take 20 mg by mouth daily as needed for erectile dysfunction. , Disp: , Rfl:    tamsulosin  (FLOMAX ) 0.4 MG CAPS capsule, TAKE 1 CAP DAILY AS NEEDED FOR SYMPTOMS OF KIDNEY STONE, Disp: 30 capsule, Rfl: 3   XOLAIR  150 MG injection,  INJECT 300 MG UNDER THE SKIN EVERY 28 DAYS (MAY REQUIRE RECONSTITUTION/DILUTION REFER TO PACKAGE INSERT OR MEDICAL ORDER), Disp: 2 each, Rfl: 11   Allergies  Allergen Reactions   Other Anaphylaxis    Pumpkin Seeds   Pumpkin Seed Hives and Swelling    Swelling of throat, tongue and lips. Swelling of throat, tongue and lips. Swelling of throat, tongue and lips.   Sesame Seed (Diagnostic) Anaphylaxis and Other (See Comments)   Bee Venom Swelling   Insect Extract Hives, Itching and Swelling   Mobic  [Meloxicam ] Nausea Only    just didn't feel well   Sesame Oil     Other Reaction(s): Not available    Past Medical  History:  Diagnosis Date   Allergy    Arthritis    right knee   GERD (gastroesophageal reflux disease)    History of kidney stones    multople stones    Hypertension    Kidney stone    Sleep apnea    Urticaria      Past Surgical History:  Procedure Laterality Date   EXTRACORPOREAL SHOCK WAVE LITHOTRIPSY Right 11/05/2016   Procedure: RIGHT EXTRACORPOREAL SHOCK WAVE LITHOTRIPSY (ESWL);  Surgeon: Juan LITTIE Clara, MD;  Location: [facility];  Service: Urology;  Laterality: Right;   GANGLION CYST EXCISION  1989   LAPAROSCOPIC GASTRIC BANDING     NEVUS EXCISION Right 04/28/2017   SPINE SURGERY      Family History  Problem Relation Age of Onset   Hypertension Mother    Diabetes Father    Cancer Maternal Grandmother    Depression Maternal Grandfather    Alzheimer's disease Paternal Grandmother    Cancer Paternal Grandfather     Social History   Tobacco Use   Smoking status: Never    Passive exposure: Never   Smokeless tobacco: Never  Vaping Use   Vaping status: Never Used  Substance Use Topics   Alcohol use: No   Drug use: No    ROS   Objective:   Vitals: BP 128/87 (BP Location: Right Arm)   Pulse 79   Temp 98.1 F (36.7 C) (Oral)   Resp 18   Wt 235 lb 0.2 oz (106.6 kg)   SpO2 98%   BMI 31.01 kg/m   Physical Exam Constitutional:      General: He is not in acute distress.    Appearance: Normal appearance. He is well-developed and normal weight. He is not ill-appearing, toxic-appearing or diaphoretic.  HENT:     Head: Normocephalic and atraumatic.     Right Ear: External ear normal.     Left Ear: External ear normal.     Nose: Nose normal.     Mouth/Throat:     Pharynx: Oropharynx is clear.   Eyes:     General: No scleral icterus.       Right eye: No discharge.        Left eye: No discharge.     Extraocular Movements: Extraocular movements intact.    Cardiovascular:     Rate and Rhythm: Normal rate.  Pulmonary:     Effort: Pulmonary effort is  normal.  Abdominal:     General: Bowel sounds are normal. There is no distension.     Palpations: Abdomen is soft. There is no mass.     Tenderness: There is no abdominal tenderness. There is no right CVA tenderness, left CVA tenderness, guarding or rebound.   Musculoskeletal:     Cervical back: Normal range of motion.  Neurological:     Mental Status: He is alert and oriented to person, place, and time.   Psychiatric:        Mood and Affect: Mood normal.        Behavior: Behavior normal.        Thought Content: Thought content normal.        Judgment: Judgment normal.     Results for orders placed or performed during the hospital encounter of 02/25/24 (from the past 24 hours)  POCT urinalysis dipstick     Status: Abnormal   Collection Time: 02/25/24  5:37 PM  Result Value Ref Range   Color, UA yellow yellow   Clarity, UA clear clear   Glucose, UA negative negative mg/dL   Bilirubin, UA negative negative   Ketones, POC UA negative negative mg/dL   Spec Grav, UA 8.979 8.989 - 1.025   Blood, UA negative negative   pH, UA 5.5 5.0 - 8.0   Protein Ur, POC negative negative mg/dL   Urobilinogen, UA 0.2 0.2 or 1.0 E.U./dL   Nitrite, UA Negative Negative   Leukocytes, UA Trace (A) Negative    Assessment and Plan :   PDMP not reviewed this encounter.  1. Urinary urgency   2. Decreased urine stream    Suspect symptomatic BPH.  Urine culture pending.  Restart Flomax .  Hydrate well and avoid urinary irritants.  Follow-up with urology as soon as possible.  No signs of an urologic emergency.  Counseled patient on potential for adverse effects with medications prescribed/recommended today, ER and return-to-clinic precautions discussed, patient verbalized understanding.    Christopher Savannah, [place] 02/25/24 1745

## 2024-02-28 ENCOUNTER — Ambulatory Visit (HOSPITAL_COMMUNITY): Payer: Self-pay

## 2024-02-28 LAB — URINE CULTURE: Culture: 20000 — AB

## 2024-02-28 MED ORDER — AMOXICILLIN 500 MG PO CAPS: 500.0000 mg | ORAL_CAPSULE | Freq: Three times a day (TID) | ORAL | 0 refills | Status: AC

## 2024-03-10 DIAGNOSIS — G4733 Obstructive sleep apnea (adult) (pediatric): Secondary | ICD-10-CM | POA: Diagnosis not present

## 2024-03-11 ENCOUNTER — Other Ambulatory Visit: Payer: Self-pay | Admitting: Allergy & Immunology

## 2024-03-17 ENCOUNTER — Other Ambulatory Visit: Payer: Self-pay | Admitting: Family Medicine

## 2024-03-17 DIAGNOSIS — R7303 Prediabetes: Secondary | ICD-10-CM

## 2024-03-17 DIAGNOSIS — Z8739 Personal history of other diseases of the musculoskeletal system and connective tissue: Secondary | ICD-10-CM

## 2024-03-17 DIAGNOSIS — I10 Essential (primary) hypertension: Secondary | ICD-10-CM

## 2024-03-30 ENCOUNTER — Ambulatory Visit (INDEPENDENT_AMBULATORY_CARE_PROVIDER_SITE_OTHER)

## 2024-03-30 DIAGNOSIS — L501 Idiopathic urticaria: Secondary | ICD-10-CM

## 2024-04-10 DIAGNOSIS — G4733 Obstructive sleep apnea (adult) (pediatric): Secondary | ICD-10-CM | POA: Diagnosis not present

## 2024-04-13 ENCOUNTER — Other Ambulatory Visit: Payer: Self-pay | Admitting: Family Medicine

## 2024-04-13 DIAGNOSIS — I10 Essential (primary) hypertension: Secondary | ICD-10-CM

## 2024-04-17 ENCOUNTER — Ambulatory Visit
Admission: RE | Admit: 2024-04-17 | Discharge: 2024-04-17 | Disposition: A | Discharge status: Home / Self Care | Source: Ambulatory Visit | Attending: Nurse Practitioner | Admitting: Nurse Practitioner

## 2024-04-17 ENCOUNTER — Ambulatory Visit (HOSPITAL_COMMUNITY)
Admission: RE | Admit: 2024-04-17 | Discharge: 2024-04-17 | Disposition: A | Discharge status: Home / Self Care | Source: Ambulatory Visit | Attending: Surgery | Admitting: Surgery

## 2024-04-17 VITALS — BP 130/86 | HR 76 | Temp 98.1°F | Resp 16

## 2024-04-17 DIAGNOSIS — M79662 Pain in left lower leg: Secondary | ICD-10-CM

## 2024-04-17 DIAGNOSIS — M79605 Pain in left leg: Secondary | ICD-10-CM | POA: Diagnosis not present

## 2024-04-17 NOTE — ED Triage Notes (Signed)
 Pt c/o of pain in left calf  x's 3 days  St's it feels like a bee sting for a few seconds then goes away  no known injury

## 2024-04-17 NOTE — ED Provider Notes (Signed)
 [name]    CSN: [id number] Arrival date & time: 04/17/24  1051      History   Chief Complaint Chief Complaint  Patient presents with   Leg Pain    A sensation of rapid pain (like a bee sting) that dissipates quickly. Top of left calve.  A few times a day-began Saturday. Thank you. - Entered by patient    HPI Juan Nelson is a 58 y.o. male.   Discussed the use of AI scribe software for clinical note transcription with the patient, who gave verbal consent to proceed.   Patient presents with a sudden, minimal shock and indistinguishable movement in the calf area that has been occurring for the past few days. The patient reports experiencing this sensation at least five to ten times a month, with two occurrences this morning. The sensation is described as not sharp but rated as a six in severity. It occurs while sitting, standing, or lying down, with no specific aggravating or alleviating factors identified.  The patient notes that this is a new sensation, never experienced before. There is no associated swelling, new coloration, or problems walking. The patient denies any chest pain, palpitations, or shortness of breath. 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  The following portions of the patient's history were reviewed and updated as appropriate: allergies, current medications, past family history, past medical history, past social history, past surgical history, and problem list.     Past Medical History:  Diagnosis Date   Allergy    Arthritis    right knee   GERD (gastroesophageal reflux disease)    History of kidney stones    multople stones    Hypertension    Kidney stone    Sleep apnea    Urticaria     Patient Active Problem List   Diagnosis Date Noted   Sensorineural hearing loss, bilateral 07/04/2023   Other specified disorders of eustachian tube,  bilateral 07/04/2023   Chronic rhinitis 07/04/2023   Impacted cerumen of both ears 07/04/2023   Left eye pain 05/02/2023   Unspecified rotator cuff tear or rupture of left shoulder, not specified as traumatic 05/21/2022   OSA on CPAP 12/25/2020   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   Primary hypertension 12/25/2020   GERD with apnea 12/25/2020   Other seasonal allergic rhinitis 03/17/2017   Hx of laparoscopic adjustable gastric banding 02/08/2017   History of kidney stones 04/18/2014   Essential hypertension 04/18/2014   LFT elevation 10/08/2012   Hyperglycemia 10/08/2012    Past Surgical History:  Procedure Laterality Date   EXTRACORPOREAL SHOCK WAVE LITHOTRIPSY Right 11/05/2016   Procedure: RIGHT EXTRACORPOREAL SHOCK WAVE LITHOTRIPSY (ESWL);  Surgeon: [name], MD;  Location: [facility];  Service: Urology;  Laterality: Right;   GANGLION CYST EXCISION  1989   LAPAROSCOPIC GASTRIC BANDING     NEVUS EXCISION Right 04/28/2017   SPINE SURGERY         Home Medications    Prior to Admission medications   Medication Sig Start Date End Date Taking? Authorizing Provider  acetaminophen  (TYLENOL ) 500 MG tablet Take 500 mg by mouth every 6 (six) hours as needed for headache.    [provider]  allopurinol  (ZYLOPRIM ) 100 MG tablet TAKE 1 TABLET BY MOUTH EVERY DAY 03/17/24   Levora Reyes SAUNDERS, MD  amLODipine  (NORVASC ) 2.5 MG tablet TAKE 1 TABLET BY MOUTH EVERY  DAY 04/13/24   Levora Reyes SAUNDERS, MD  aspirin EC 81 MG tablet Take 81 mg by mouth daily. Swallow whole.    [provider]  cholecalciferol (VITAMIN D ) 1000 UNITS tablet Take 5,000 Units by mouth daily.    [provider]  colchicine  0.6 MG tablet Take two tabs once at the first sign of a gout flare and then one an hour later. Repeat in 24 hours if needed. Patient taking differently: Take 0.12 mg by mouth daily as needed (gout). Take two tabs once at the first sign of a gout flare and then one an hour  later. Repeat in 24 hours if needed. 08/16/17   Gretta Ozell CROME, PA-C  cyclobenzaprine (FLEXERIL) 10 MG tablet TAKE 1 TABLET(S) EVERY 6-8 HOURS AS NEEDED FOR SPASM 06/09/22   [provider]  diclofenac  (VOLTAREN ) 75 MG EC tablet Take 1 tablet (75 mg total) by mouth 2 (two) times daily. As needed 01/29/21   Levora Reyes SAUNDERS, MD  EPINEPHrine  (AUVI-Q ) 0.3 mg/0.3 mL IJ SOAJ injection Inject 0.3 mg into the muscle as needed for anaphylaxis. 12/29/22   Iva Marty Saltness, MD  fluticasone  (FLONASE ) 50 MCG/ACT nasal spray SPRAY 2 SPRAYS INTO EACH NOSTRIL EVERY DAY 03/13/24   Iva Marty Saltness, MD  glucose blood [facility] VERIO) test strip Use as instructed 08/26/23   Greene, Jeffrey R, MD  ipratropium (ATROVENT ) 0.03 % nasal spray PLACE 2 SPRAYS INTO BOTH NOSTRILS 3 (THREE) TIMES DAILY 01/20/24   Iva Marty Saltness, MD  lisinopril  (ZESTRIL ) 40 MG tablet TAKE 1 TABLET BY MOUTH EVERY DAY 03/17/24   Levora Reyes SAUNDERS, MD  loratadine  (ALLERGY) 10 MG tablet Take 1 tablet (10 mg total) by mouth daily. Alegra D 12/29/22   Iva Marty Saltness, MD  metFORMIN  (GLUCOPHAGE ) 500 MG tablet TAKE 1 TABLET BY MOUTH EVERY DAY WITH BREAKFAST 03/17/24   Levora Reyes SAUNDERS, MD  Multiple Vitamin (MULTIVITAMIN WITH MINERALS) TABS Take 1 tablet by mouth daily.    [provider]  tadalafil (CIALIS) 20 MG tablet Take 20 mg by mouth daily as needed for erectile dysfunction.  10/27/19   [provider]  tamsulosin  (FLOMAX ) 0.4 MG CAPS capsule Take 1 capsule (0.4 mg total) by mouth daily after supper. 02/25/24   Christopher Savannah, PA-C  XOLAIR  150 MG injection INJECT 300 MG UNDER THE SKIN EVERY 28 DAYS (MAY REQUIRE RECONSTITUTION/DILUTION REFER TO PACKAGE INSERT OR MEDICAL ORDER) 10/18/23   Iva Marty Saltness, MD    Family History Family History  Problem Relation Age of Onset   Hypertension Mother    Diabetes Father    Cancer Maternal Grandmother    Depression Maternal Grandfather    Alzheimer's  disease Paternal Grandmother    Cancer Paternal Grandfather     Social History Social History   Tobacco Use   Smoking status: Never    Passive exposure: Never   Smokeless tobacco: Never  Vaping Use   Vaping status: Never Used  Substance Use Topics   Alcohol use: No   Drug use: No     Allergies   Other, Pumpkin seed, Sesame seed (diagnostic), Bee venom, Insect extract, Mobic  [meloxicam ], and Sesame oil   Review of Systems Review of Systems  Respiratory:  Negative for shortness of breath.   Cardiovascular:  Negative for chest pain, palpitations and leg swelling.  Musculoskeletal:  Negative for gait problem and joint swelling.       Frequent but sharp pain to the left calf without redness or swelling  All other systems reviewed and are negative.    Physical Exam Triage Vital Signs ED Triage Vitals  Encounter Vitals Group     BP 04/17/24 1119 130/86     Girls Systolic BP Percentile --      Girls Diastolic BP Percentile --      Boys Systolic BP Percentile --      Boys Diastolic BP Percentile --      Pulse Rate 04/17/24 1119 76     Resp 04/17/24 1119 16     Temp 04/17/24 1119 98.1 F (36.7 C)     Temp Source 04/17/24 1119 Oral     SpO2 04/17/24 1119 96 %     Weight --      Height --      Head Circumference --      Peak Flow --      Pain Score 04/17/24 1120 0     Pain Loc --      Pain Education --      Exclude from Growth Chart --    No data found.  Updated Vital Signs BP 130/86 (BP Location: Left Arm)   Pulse 76   Temp 98.1 F (36.7 C) (Oral)   Resp 16   SpO2 96%   Visual Acuity Right Eye Distance:   Left Eye Distance:   Bilateral Distance:    Right Eye Near:   Left Eye Near:    Bilateral Near:     Physical Exam Vitals reviewed.  Constitutional:      General: He is awake. He is not in acute distress.    Appearance: Normal appearance. He is well-developed. He is not ill-appearing, toxic-appearing or diaphoretic.  HENT:     Head:  Normocephalic.     Right Ear: Hearing normal.     Left Ear: Hearing normal.     Nose: Nose normal.     Mouth/Throat:     Mouth: Mucous membranes are moist.  Eyes:     General: Vision grossly intact.     Conjunctiva/sclera: Conjunctivae normal.  Cardiovascular:     Rate and Rhythm: Normal rate and regular rhythm.     Heart sounds: Normal heart sounds.  Pulmonary:     Effort: Pulmonary effort is normal.     Breath sounds: Normal breath sounds and air entry.  Musculoskeletal:        General: Normal range of motion.     Cervical back: Full passive range of motion without pain, normal range of motion and neck supple.     Right lower leg: No edema.     Left lower leg: Normal. No swelling, deformity or tenderness. No edema.  Skin:    General: Skin is warm and dry.  Neurological:     General: No focal deficit present.     Mental Status: He is alert and oriented to person, place, and time.  Psychiatric:        Speech: Speech normal.        Behavior: Behavior is cooperative.      UC Treatments / Results  Labs (all labs ordered are listed, but only abnormal results are displayed) Labs Reviewed - No data to display  EKG   Radiology No results found.  Procedures Procedures (including critical care time)  Medications Ordered in UC Medications - No data to display  Initial Impression / Assessment and Plan / UC Course  I have reviewed the triage vital signs and the nursing notes.  Pertinent labs & imaging results  that were available during my care of the patient were reviewed by me and considered in my medical decision making (see chart for details).    The patient reports sudden, brief shock-like sensations and an indistinct movement in the left calf occurring 5-10 times per day over the past several days. Symptoms have been increasing in frequency but not in severity, with pain rated at 6/10. There is no associated swelling, discoloration, or difficulty walking. 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 Given the presentation and absence of major risk factors, the likelihood of deep vein thrombosis is low; however, a lower extremity ultrasound has been ordered to rule out DVT, scheduled for today at 2:30 PM. The patient was instructed to monitor closely for new or worsening symptoms such as swelling, warmth over the calf, pain when walking, chest tightness, palpitations, or shortness of breath, and to seek immediate emergency care if these occur. Follow-up with the primary care provider is advised once ultrasound results are available or sooner if symptoms progress.  Today's evaluation has revealed no signs of a dangerous process. Discussed diagnosis with patient and/or guardian. Patient and/or guardian aware of their diagnosis, possible red flag symptoms to watch out for and need for close follow up. Patient and/or guardian understands verbal and written discharge instructions. Patient and/or guardian comfortable with plan and disposition.  Patient and/or guardian has a clear mental status at this time, good insight into illness (after discussion and teaching) and has clear judgment to make decisions regarding their care  Documentation was completed with the aid of voice recognition software. Transcription may contain typographical errors.  Final Clinical Impressions(s) / UC Diagnoses   Final diagnoses:  Pain of left calf     Discharge Instructions      You were seen today for discomfort in your left calf that feels like brief shock-like sensations and has been happening several times a day over the past few days. Your symptoms have become more frequent but not more severe, and you do not have swelling, skin color changes, or trouble walking.   Because blood clots in the leg (deep vein thrombosis, or DVT) can sometimes cause similar symptoms, a leg ultrasound has been scheduled for  today at 2:30 PM to check for this. Please go to the [facility] (address [address] [place]) at 8 am and report to the 4th floor registration Zone A.  Inform registration that you are there for a vascular study.  While waiting for your test, you should continue to pay close attention to your symptoms. If you notice new or worsening swelling, warmth in the calf, pain when walking, or skin changes, contact your primary care provider. If you develop sudden chest pain, tightness, palpitations, difficulty breathing, or if your symptoms become severe, go to the emergency department right away.  To help with comfort, you may gently stretch your calf, stay well hydrated, and take breaks from sitting for long periods by standing and walking around every hour. Over-the-counter pain relievers such as Tylenol  or ibuprofen may also be used as directed if needed. Follow up with your primary care provider once your ultrasound results are available or sooner if your symptoms change.     ED Prescriptions   None    PDMP not reviewed this encounter.   Iola Lukes, [place] 04/17/24 1215

## 2024-04-17 NOTE — Discharge Instructions (Addendum)
 You were seen today for discomfort in your left calf that feels like brief shock-like sensations and has been happening several times a day over the past few days. Your symptoms have become more frequent but not more severe, and you do not have swelling, skin color changes, or trouble walking.   Because blood clots in the leg (deep vein thrombosis, or DVT) can sometimes cause similar symptoms, a leg ultrasound has been scheduled for today at 2:30 PM to check for this. Please go to the [facility] (address [address] [place]) at 8 am and report to the 4th floor registration Zone A.  Inform registration that you are there for a vascular study.  While waiting for your test, you should continue to pay close attention to your symptoms. If you notice new or worsening swelling, warmth in the calf, pain when walking, or skin changes, contact your primary care provider. If you develop sudden chest pain, tightness, palpitations, difficulty breathing, or if your symptoms become severe, go to the emergency department right away.  To help with comfort, you may gently stretch your calf, stay well hydrated, and take breaks from sitting for long periods by standing and walking around every hour. Over-the-counter pain relievers such as Tylenol  or ibuprofen may also be used as directed if needed. Follow up with your primary care provider once your ultrasound results are available or sooner if your symptoms change.

## 2024-04-18 ENCOUNTER — Ambulatory Visit (HOSPITAL_COMMUNITY): Payer: Self-pay

## 2024-05-05 ENCOUNTER — Encounter: Payer: Self-pay | Admitting: Allergy & Immunology

## 2024-05-08 MED ORDER — EPINEPHRINE 0.3 MG/0.3ML IJ SOAJ: 0.3000 mg | INTRAMUSCULAR | 1 refills | Status: DC | PRN

## 2024-05-09 DIAGNOSIS — G4733 Obstructive sleep apnea (adult) (pediatric): Secondary | ICD-10-CM | POA: Diagnosis not present

## 2024-05-10 ENCOUNTER — Other Ambulatory Visit: Payer: Self-pay | Admitting: Allergy & Immunology

## 2024-05-11 ENCOUNTER — Ambulatory Visit

## 2024-05-11 DIAGNOSIS — L501 Idiopathic urticaria: Secondary | ICD-10-CM | POA: Diagnosis not present

## 2024-05-25 ENCOUNTER — Encounter: Payer: Self-pay | Admitting: Family Medicine

## 2024-05-25 DIAGNOSIS — Z1211 Encounter for screening for malignant neoplasm of colon: Secondary | ICD-10-CM

## 2024-05-26 NOTE — Telephone Encounter (Signed)
 Anyway we can find a location in [place] for colonoscopy? Thank you

## 2024-05-30 NOTE — Telephone Encounter (Signed)
 Patient called in asking for referral to have colonoscopy done, okay to place referral?

## 2024-05-30 NOTE — Telephone Encounter (Signed)
 Called patient to let him know that there is not a referral to GI from our office. Left vm to return call

## 2024-05-30 NOTE — Telephone Encounter (Signed)
 I do not see a referral to GI for a colonoscopy?

## 2024-05-31 DIAGNOSIS — D2262 Melanocytic nevi of left upper limb, including shoulder: Secondary | ICD-10-CM | POA: Diagnosis not present

## 2024-05-31 DIAGNOSIS — D2261 Melanocytic nevi of right upper limb, including shoulder: Secondary | ICD-10-CM | POA: Diagnosis not present

## 2024-05-31 DIAGNOSIS — D2271 Melanocytic nevi of right lower limb, including hip: Secondary | ICD-10-CM | POA: Diagnosis not present

## 2024-06-01 NOTE — Progress Notes (Signed)
 PATIENT: Juan Nelson DOB: Mar 03, 1966  REASON FOR VISIT: follow up HISTORY FROM: patient  Chief Complaint  Patient presents with   Obstructive Sleep Apnea    Rm1, alone, pt stated that they are compliant to use cpap for osa and feels well rested. Ess 5, fss not done     HISTORY OF PRESENT ILLNESS:  06/06/24 ALL:  Juan Nelson returns for follow up for OSA on CPAP. He continues to do well. He is using therapy nightly for 7-8 hours, on average. He denies concerns with machine or supplies. He is now the primary caregiver for his mother who has moved in with him.     06/07/2023 ALL:  Juan Nelson returns for follow up for OSA on CPAP. He is doing well. He uses CPAP nightly for about 7 hours, on average. He denies concerns with machine or supplies.     06/03/2022 ALL:  Juan Nelson returns for follow up for OSA on CPAP. He continues to do well on therapy. He is using CPAP nightly for about 7.5h. He denies concerns with machine or supplies. He does note better sleep quality and wakes feeling more refreshed using CPAP.     06/03/2021 ALL: Juan Nelson Juan Nelson is a 58 y.o. male here today for follow up for OSA on CPAP.  HST 01/28/2021 showed OSA with AHI 19.8/hr. No hypoxemia. New CPAP machine was ordered. He is adjusting well. He is using a nasal mask. No concerns with machine or supplies.   Compliance report dated 05/05/2021-06/03/2021 shows that he used CPAP 29/30 days for greater than 4 hours for compliance of 97%. Average usage was 8 hours. Residual AHI was 0.5/hr on 6-16cmH20. No leak noted.   HISTORY: (copied from Dr Dohmeier's previous note)  Juan Nelson Nelson is a 70- year -old Caucasian male patient seen here as a referral on 12/25/2020 from Dr Levora for a transfer of sleep apnea care.   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 He had back surgery in 2014, was seen by Morey Hanlon, MD    The patient had the first sleep study in the year 2004-  with a result of an AHI ( Apnea Hypopnea index)  of 28/h, the patient had some periodic limb movements but this did not lead to arousals.  He had a normal cardiac rhythm according to Dr. Reggy Salt at the time he was referred by Dr. Alm Barters.  This based on PSG was followed by a CPAP titration to 14 cmH2O dated 12-05-2002 the patient had snoring that was positional dependent.  He did not have central apneas only in rem sleep where there a few apneas remaining while on CPAP.  The AHI was 0.0 at 14 cm water pressure.   Sleep relevant medical history: one nocturia on CPAP- no snoring on CPAP.    Family medical /sleep history: his father with OSA.    Social history:  Patient is working as Research officer, political party an Runner, broadcasting/film/video - choir- church related.  and lives in a household allone. 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   Tobacco use: none .  ETOH use none ,Caffeine intake in form of Coffee(/) Soda( daily) Tea ( daily*) or energy drinks. Regular exercise in form of walking.    Sleep habits are as follows: The patient's dinner time is between 6-8.30 PM. The patient goes to bed at 11-12 PM and continues to sleep for interval of 4 hours, wakes for one bathroom break.   The preferred sleep position is right and supine , with the support of 2 pillows with one additional body pillow- Dreams are reportedly rare. 8.30 AM is the usual rise time. The patient wakes up spontaneously as late as 8AM.  He reports not feeling refreshed or restored in AM, quality has ben  declining-  Naps are taken infrequently, lasting from 20-30  minutes and are more refreshing than nocturnal sleep.    REVIEW OF SYSTEMS: Out of a complete 14 system review of symptoms, the patient complains only of the following symptoms, none and all other reviewed systems are negative.  ESS: 5  ALLERGIES: Allergies  Allergen Reactions   Other Anaphylaxis    Pumpkin Seeds   Pumpkin Seed Hives and Swelling    Swelling of throat, tongue and lips. Swelling of throat, tongue and lips. Swelling of throat, tongue and lips.   Sesame Seed (Diagnostic) Anaphylaxis and Other (See Comments)   Bee Venom Swelling   Insect Extract Hives, Itching and Swelling   Mobic  [Meloxicam ] Nausea Only    just didn't feel well   Sesame Oil     Other Reaction(s): Not available    HOME MEDICATIONS: Outpatient Medications Prior to Visit  Medication Sig Dispense Refill   acetaminophen  (TYLENOL ) 500 MG tablet Take 500 mg by mouth every 6 (six) hours as needed for headache.     allopurinol  (ZYLOPRIM ) 100 MG tablet TAKE 1 TABLET BY MOUTH EVERY DAY 30 tablet 5   amLODipine  (NORVASC ) 2.5 MG tablet TAKE 1 TABLET BY MOUTH EVERY DAY 30 tablet 5   aspirin EC 81 MG tablet Take 81 mg by mouth daily. Swallow whole.     cholecalciferol (VITAMIN D ) 1000 UNITS tablet Take 5,000 Units by mouth daily.     colchicine  0.6 MG tablet Take two tabs once at the first sign of a gout flare and then one an hour later. Repeat in 24 hours if needed. (Patient taking differently: Take 0.12 mg by mouth daily as needed (gout). Take two tabs once at the first sign of a gout flare and then one an hour later. Repeat in 24 hours if needed.) 12 tablet 1   cyclobenzaprine (FLEXERIL) 10 MG tablet TAKE 1 TABLET(S) EVERY 6-8 HOURS AS NEEDED FOR SPASM     diclofenac  (VOLTAREN ) 75 MG EC tablet Take 1 tablet (75 mg total) by mouth 2 (two) times daily. As needed 180 tablet 0   EPINEPHrine  (AUVI-Q ) 0.3 mg/0.3 mL IJ SOAJ injection Inject 0.3 mg into the  muscle as needed for anaphylaxis. 2 each 2   EPINEPHrine  0.3 mg/0.3 mL IJ SOAJ injection Inject 0.3 mg into the muscle as needed for anaphylaxis. 0.3 mL 1   fluticasone  (FLONASE ) 50 MCG/ACT nasal spray SPRAY 2 SPRAYS INTO EACH NOSTRIL EVERY DAY 16 mL 1   glucose blood (ONETOUCH VERIO) test strip Use as instructed 50 strip 1   ipratropium (ATROVENT ) 0.03 % nasal spray PLACE 2 SPRAYS INTO BOTH NOSTRILS 3 (THREE) TIMES DAILY 30 mL 12   lisinopril  (ZESTRIL ) 40 MG  tablet TAKE 1 TABLET BY MOUTH EVERY DAY 30 tablet 5   loratadine  (ALLERGY) 10 MG tablet Take 1 tablet (10 mg total) by mouth daily. Alegra D 90 tablet 1   metFORMIN  (GLUCOPHAGE ) 500 MG tablet TAKE 1 TABLET BY MOUTH EVERY DAY WITH BREAKFAST 30 tablet 3   Multiple Vitamin (MULTIVITAMIN WITH MINERALS) TABS Take 1 tablet by mouth daily.     tadalafil (CIALIS) 20 MG tablet Take 20 mg by mouth daily as needed for erectile dysfunction.      tamsulosin  (FLOMAX ) 0.4 MG CAPS capsule Take 1 capsule (0.4 mg total) by mouth daily after supper. 30 capsule 0   XOLAIR  150 MG injection INJECT 300 MG UNDER THE SKIN EVERY 28 DAYS (MAY REQUIRE RECONSTITUTION/DILUTION REFER TO PACKAGE INSERT OR MEDICAL ORDER) 2 each 11   Facility-Administered Medications Prior to Visit  Medication Dose Route Frequency Provider Last Rate Last Admin   omalizumab  (XOLAIR ) injection 300 mg  300 mg Subcutaneous Q28 days [name], MD   300 mg at 05/11/24 9085    PAST MEDICAL HISTORY: Past Medical History:  Diagnosis Date   Allergy    Arthritis    right knee   GERD (gastroesophageal reflux disease)    History of kidney stones    multople stones    Hypertension    Kidney stone    Sleep apnea    Urticaria     PAST SURGICAL HISTORY: Past Surgical History:  Procedure Laterality Date   EXTRACORPOREAL SHOCK WAVE LITHOTRIPSY Right 11/05/2016   Procedure: RIGHT EXTRACORPOREAL SHOCK WAVE LITHOTRIPSY (ESWL);  Surgeon: [name], MD;  Location: [facility];   Service: Urology;  Laterality: Right;   GANGLION CYST EXCISION  1989   LAPAROSCOPIC GASTRIC BANDING     NEVUS EXCISION Right 04/28/2017   SPINE SURGERY      FAMILY HISTORY: Family History  Problem Relation Age of Onset   Hypertension Mother    Diabetes Father    Cancer Maternal Grandmother    Depression Maternal Grandfather    Alzheimer's disease Paternal Grandmother    Cancer Paternal Grandfather     SOCIAL HISTORY: Social History   Socioeconomic History   Marital status: Single    Spouse name: Not on file   Number of children: Not on file   Years of education: Not on file   Highest education level: Doctorate  Occupational History   Not on file  Tobacco Use   Smoking status: Never    Passive exposure: Never   Smokeless tobacco: Never  Vaping Use   Vaping status: Never Used  Substance and Sexual Activity   Alcohol use: No   Drug use: No   Sexual activity: Not Currently  Other Topics Concern   Not on file  Social History Narrative   Lives alone   Caffeine- varies 0-1   Social Drivers of Health   Financial Resource Strain: Low Risk  (06/06/2024)   Overall Financial Resource Strain (CARDIA)    Difficulty of Paying Living Expenses: Not hard at all  Food Insecurity: No Food Insecurity (06/06/2024)   Hunger Vital Sign    Worried About Running Out of Food in the Last Year: Never true    Ran Out of Food in the Last Year: Never true  Transportation Needs: No Transportation Needs (06/06/2024)   PRAPARE - Administrator, Civil Service (Medical): No    Lack of Transportation (Non-Medical): No  Physical Activity: Insufficiently Active (06/06/2024)   Exercise Vital Sign  Days of Exercise per Week: 2 days    Minutes of Exercise per Session: 30 min  Stress: No Stress Concern Present (06/06/2024)   [employer] of Occupational Health - Occupational Stress Questionnaire    Feeling of Stress: Only a little  Social Connections: Moderately Integrated  (06/06/2024)   Social Connection and Isolation Panel    Frequency of Communication with Friends and Family: More than three times a week    Frequency of Social Gatherings with Friends and Family: More than three times a week    Attends Religious Services: More than 4 times per year    Active Member of Clubs or Organizations: Yes    Attends Banker Meetings: More than 4 times per year    Marital Status: Never married  Intimate Partner Violence: Not on file     PHYSICAL EXAM  Vitals:   06/06/24 0942  BP: 138/86  Pulse: 78  SpO2: 99%  Weight: 245 lb (111.1 kg)  Height: 6' 1 (1.854 m)      Body mass index is 32.32 kg/m.  Generalized: Well developed, in no acute distress  Cardiology: normal rate and rhythm, no murmur noted Respiratory: clear to auscultation bilaterally  Neurological examination  Mentation: Alert oriented to time, place, history taking. Follows all commands speech and language fluent Cranial nerve II-XII: Pupils were equal round reactive to light. Extraocular movements were full, visual field were full  Motor: The motor testing reveals 5 over 5 strength of all 4 extremities. Good symmetric motor tone is noted throughout.  Gait and station: Gait is normal.    DIAGNOSTIC DATA (LABS, IMAGING, TESTING) - I reviewed patient records, labs, notes, testing and imaging myself where available.      No data to display           Lab Results  Component Value Date   WBC 4.7 12/20/2023   HGB 13.5 12/20/2023   HCT 39.2 12/20/2023   MCV 88 12/20/2023   PLT 183 12/20/2023      Component Value Date/Time   NA 143 12/20/2023 1159   K 4.4 12/20/2023 1159   CL 106 12/20/2023 1159   CO2 23 12/20/2023 1159   GLUCOSE 108 (H) 12/20/2023 1159   GLUCOSE 121 (H) 08/26/2023 1118   BUN 13 12/20/2023 1159   CREATININE 0.85 12/20/2023 1159   CREATININE 1.05 01/24/2016 1342   CALCIUM 9.0 12/20/2023 1159   PROT 6.5 12/20/2023 1159   ALBUMIN 4.5 12/20/2023 1159    AST 27 12/20/2023 1159   ALT 38 12/20/2023 1159   ALKPHOS 108 12/20/2023 1159   BILITOT 0.5 12/20/2023 1159   GFRNONAA 92 07/05/2020 1619   GFRNONAA 82 01/24/2016 1342   GFRAA 106 07/05/2020 1619   GFRAA >89 01/24/2016 1342   Lab Results  Component Value Date   CHOL 153 08/26/2023   HDL 34.90 (L) 08/26/2023   LDLCALC 86 08/26/2023   TRIG 156.0 (H) 08/26/2023   CHOLHDL 4 08/26/2023   Lab Results  Component Value Date   HGBA1C 6.1 08/26/2023   Lab Results  Component Value Date   VITAMINB12 >2000 (H) 06/27/2018   Lab Results  Component Value Date   TSH 0.94 08/26/2023     ASSESSMENT AND PLAN 58 y.o. year old male  has a past medical history of Allergy, Arthritis, GERD (gastroesophageal reflux disease), History of kidney stones, Hypertension, Kidney stone, Sleep apnea, and Urticaria. here with     ICD-10-CM   1. OSA on CPAP  G47.33  For home use only DME continuous positive airway pressure (CPAP)      Rice B Thole Nelson is doing well on CPAP therapy. Compliance report reveals excellent compliance. He was encouraged to continue using CPAP nightly and for greater than 4 hours each night. Risks of untreated sleep apnea review and education materials provided. Healthy lifestyle habits encouraged. He will follow up in 1 year, sooner if needed. He verbalizes understanding and agreement with this plan.    Orders Placed This Encounter  Procedures   For home use only DME continuous positive airway pressure (CPAP)    Heated Humidity with all supplies as needed    Length of Need:   Lifetime    Patient has OSA or probable OSA:   Yes    Is the patient currently using CPAP in the home:   Yes    Settings:   Other see comments    CPAP supplies needed:   Mask, headgear, cushions, filters, heated tubing and water chamber      No orders of the defined types were placed in this encounter.      Greig Forbes, FNP-C 06/06/2024, 12:54 PM [facility] [address]  [address] [place], [place] [address] [phone]

## 2024-06-01 NOTE — Patient Instructions (Addendum)
 Please continue using your CPAP regularly. While your insurance requires that you use CPAP at least 4 hours each night on 70% of the nights, I recommend, that you not skip any nights and use it throughout the night if you can. Getting used to CPAP and staying with the treatment long term does take time and patience and discipline. Untreated obstructive sleep apnea when it is moderate to severe can have an adverse impact on cardiovascular health and raise her risk for heart disease, arrhythmias, hypertension, congestive heart failure, stroke and diabetes. Untreated obstructive sleep apnea causes sleep disruption, nonrestorative sleep, and sleep deprivation. This can have an impact on your day to day functioning and cause daytime sleepiness and impairment of cognitive function, memory loss, mood disturbance, and problems focussing. Using CPAP regularly can improve these symptoms.  We will update supply orders, today. Your compliance looks great. Let me know if you need me.   Follow up in 1 year

## 2024-06-01 NOTE — Telephone Encounter (Signed)
 Referral ordered.  Let me know if anything is needed.  Thanks

## 2024-06-05 ENCOUNTER — Encounter: Payer: Self-pay | Admitting: *Deleted

## 2024-06-06 ENCOUNTER — Encounter: Payer: Self-pay | Admitting: Family Medicine

## 2024-06-06 ENCOUNTER — Ambulatory Visit: Payer: BC Managed Care – PPO | Admitting: Family Medicine

## 2024-06-06 VITALS — BP 138/86 | HR 78 | Ht 73.0 in | Wt 245.0 lb

## 2024-06-06 DIAGNOSIS — G4733 Obstructive sleep apnea (adult) (pediatric): Secondary | ICD-10-CM

## 2024-06-06 NOTE — Telephone Encounter (Signed)
 I have sent patient a mychart message asking for him to make a follow up appointment. Sending message to you as FYI

## 2024-06-07 ENCOUNTER — Ambulatory Visit: Admitting: Family Medicine

## 2024-06-07 ENCOUNTER — Encounter: Payer: Self-pay | Admitting: Family Medicine

## 2024-06-07 ENCOUNTER — Other Ambulatory Visit: Payer: Self-pay | Admitting: Family Medicine

## 2024-06-07 VITALS — BP 118/82 | HR 72 | Temp 98.4°F | Resp 16 | Ht 73.0 in | Wt 246.0 lb

## 2024-06-07 DIAGNOSIS — Z6832 Body mass index (BMI) 32.0-32.9, adult: Secondary | ICD-10-CM

## 2024-06-07 DIAGNOSIS — E66811 Obesity, class 1: Secondary | ICD-10-CM

## 2024-06-07 DIAGNOSIS — R7303 Prediabetes: Secondary | ICD-10-CM

## 2024-06-07 DIAGNOSIS — G4733 Obstructive sleep apnea (adult) (pediatric): Secondary | ICD-10-CM

## 2024-06-07 DIAGNOSIS — I10 Essential (primary) hypertension: Secondary | ICD-10-CM | POA: Diagnosis not present

## 2024-06-07 LAB — LIPID PANEL
Cholesterol: 128 mg/dL (ref 0–200)
HDL: 34.8 mg/dL — ABNORMAL LOW (ref >39.00)
LDL Cholesterol: 61 mg/dL (ref 0–99)
NonHDL: 93.24
Total CHOL/HDL Ratio: 4
Triglycerides: 163 mg/dL — ABNORMAL HIGH (ref 0.0–149.0)
VLDL: 32.6 mg/dL (ref 0.0–40.0)

## 2024-06-07 LAB — COMPREHENSIVE METABOLIC PANEL WITH GFR
ALT: 34 U/L (ref 0–53)
AST: 24 U/L (ref 0–37)
Albumin: 4.5 g/dL (ref 3.5–5.2)
Alkaline Phosphatase: 86 U/L (ref 39–117)
BUN: 20 mg/dL (ref 6–23)
CO2: 26 meq/L (ref 19–32)
Calcium: 8.7 mg/dL (ref 8.4–10.5)
Chloride: 105 meq/L (ref 96–112)
Creatinine, Ser: 0.85 mg/dL (ref 0.40–1.50)
GFR: 94.92 mL/min (ref >60.00)
Glucose, Bld: 122 mg/dL — ABNORMAL HIGH (ref 70–99)
Potassium: 3.9 meq/L (ref 3.5–5.1)
Sodium: 139 meq/L (ref 135–145)
Total Bilirubin: 0.5 mg/dL (ref 0.2–1.2)
Total Protein: 6.9 g/dL (ref 6.0–8.3)

## 2024-06-07 LAB — HEMOGLOBIN A1C: Hgb A1c MFr Bld: 6.2 % (ref 4.6–6.5)

## 2024-06-07 MED ORDER — TIRZEPATIDE-WEIGHT MANAGEMENT 2.5 MG/0.5ML ~~LOC~~ SOLN: 2.5000 mg | SUBCUTANEOUS | 1 refills | Status: DC

## 2024-06-07 MED ORDER — METFORMIN HCL 500 MG PO TABS: 500.0000 mg | ORAL_TABLET | Freq: Every day | ORAL | 1 refills | Status: DC

## 2024-06-07 NOTE — Telephone Encounter (Signed)
 [facility] does not stock vials. Okay to change to pens?

## 2024-06-07 NOTE — Patient Instructions (Addendum)
 Thank you for coming in today.  I will order the zepbound, hopefully that will be covered with insurance given your sleep apnea but if that is not, then can be reordered in the vial form.  No change in other medications at this time. If there are any concerns on your bloodwork, I will let you know. Take care!

## 2024-06-07 NOTE — Telephone Encounter (Signed)
Pen ordered

## 2024-06-07 NOTE — Progress Notes (Signed)
 Subjective:  Patient ID: Juan Nelson, male    DOB: Mar 29, 1966  Age: 58 y.o. MRN: [id number]  CC:  Chief Complaint  Patient presents with   Weight Check    Wants to talk about Zepbound. Patient has sleep apnea and would like to try it    HPI Juan Nelson presents for follow up. No health changes.   Prediabetes: With obesity, last discussed in December 2024.  Wegovy  ordered at that time, not covered by insurance.  He has followed up with sleep specialist and has been diagnosed with sleep apnea, now would like to consider trying zepbound given indication for sleep apnea as well. Would consider out of pocket if not covered.  Weight is 4 pounds higher from December of last year. Metformin  500 mg daily. No side effects.  Exercise - less, about 2 times per week - plan to increase.  Fast food/take out - rare - more home meal prep.  Diet soda only, no alcohol.    Lab Results  Component Value Date   HGBA1C 6.1 08/26/2023   Wt Readings from Last 3 Encounters:  06/07/24 246 lb (111.6 kg)  06/06/24 245 lb (111.1 kg)  02/25/24 235 lb 0.2 oz (106.6 kg)  Body mass index is 32.46 kg/m.  Hypertension: Lisinopril  40 mg daily, amlodipine  2.5 mg daily, has been seen by nephrology. History of OSA on CPAP, sleep specialist eval yesterday, great compliance with his CPAP. Feeling rested. No side effects with meds.  Home readings: 120/70-80.  BP Readings from Last 3 Encounters:  06/07/24 118/82  06/06/24 138/86  04/17/24 130/86   Lab Results  Component Value Date   CREATININE 0.85 12/20/2023   Gout: Last flare: No recent flare.  Daily meds: Allopurinol  100 mg daily Prn med: Colchicine  as needed - not needed.  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   Primary hypertension 12/25/2020   GERD with apnea 12/25/2020   Other seasonal allergic rhinitis 03/17/2017   Hx of laparoscopic adjustable gastric banding 02/08/2017   History of kidney stones 04/18/2014   Essential hypertension 04/18/2014   LFT elevation 10/08/2012   Hyperglycemia 10/08/2012   Past Medical History:  Diagnosis Date   Allergy    Arthritis    right knee   GERD (gastroesophageal reflux disease)    History of kidney stones    multople stones    Hypertension    Kidney stone    Sleep apnea    Urticaria    Past Surgical History:  Procedure Laterality Date   EXTRACORPOREAL SHOCK WAVE LITHOTRIPSY Right 11/05/2016   Procedure: RIGHT EXTRACORPOREAL SHOCK WAVE LITHOTRIPSY (ESWL);  Surgeon: [name], MD;  Location: [facility];  Service: Urology;  Laterality: Right;   GANGLION CYST EXCISION  1989   LAPAROSCOPIC GASTRIC BANDING     NEVUS EXCISION Right 04/28/2017   SPINE SURGERY     Allergies  Allergen Reactions   Other Anaphylaxis    Pumpkin Seeds   Pumpkin Seed Hives and Swelling  Swelling of throat, tongue and lips. Swelling of throat, tongue and lips. Swelling of throat, tongue and lips.   Sesame Seed (Diagnostic) Anaphylaxis and Other (See Comments)   Bee Venom Swelling   Insect Extract Hives, Itching and Swelling   Mobic  [Meloxicam ] Nausea Only    just didn't feel well   Sesame Oil     Other Reaction(s): Not available   Prior to Admission medications   Medication Sig Start Date End Date Taking? Authorizing Provider  acetaminophen  (TYLENOL ) 500 MG tablet Take 500 mg by mouth every 6 (six) hours as needed for headache.   Yes [provider]  allopurinol  (ZYLOPRIM ) 100 MG tablet TAKE 1 TABLET  BY MOUTH EVERY DAY 03/17/24  Yes Levora Reyes SAUNDERS, MD  amLODipine  (NORVASC ) 2.5 MG tablet TAKE 1 TABLET BY MOUTH EVERY DAY 04/13/24  Yes Levora Reyes SAUNDERS, MD  aspirin EC 81 MG tablet Take 81 mg by mouth daily. Swallow whole.   Yes [provider]  cholecalciferol (VITAMIN D ) 1000 UNITS tablet Take 5,000 Units by mouth daily.   Yes [provider]  colchicine  0.6 MG tablet Take two tabs once at the first sign of a gout flare and then one an hour later. Repeat in 24 hours if needed. Patient taking differently: Take 0.12 mg by mouth daily as needed (gout). Take two tabs once at the first sign of a gout flare and then one an hour later. Repeat in 24 hours if needed. 08/16/17  Yes Gretta Ozell CROME, PA-C  cyclobenzaprine (FLEXERIL) 10 MG tablet TAKE 1 TABLET(S) EVERY 6-8 HOURS AS NEEDED FOR SPASM 06/09/22  Yes [provider]  diclofenac  (VOLTAREN ) 75 MG EC tablet Take 1 tablet (75 mg total) by mouth 2 (two) times daily. As needed 01/29/21  Yes Levora Reyes SAUNDERS, MD  EPINEPHrine  (AUVI-Q ) 0.3 mg/0.3 mL IJ SOAJ injection Inject 0.3 mg into the muscle as needed for anaphylaxis. 12/29/22  Yes Iva Marty Saltness, MD  EPINEPHrine  0.3 mg/0.3 mL IJ SOAJ injection Inject 0.3 mg into the muscle as needed for anaphylaxis. 05/08/24  Yes Iva Marty Saltness, MD  fluticasone  (FLONASE ) 50 MCG/ACT nasal spray SPRAY 2 SPRAYS INTO EACH NOSTRIL EVERY DAY 05/10/24  Yes Iva Marty Saltness, MD  glucose blood [facility] VERIO) test strip Use as instructed 08/26/23  Yes Levora Reyes SAUNDERS, MD  ipratropium (ATROVENT ) 0.03 % nasal spray PLACE 2 SPRAYS INTO BOTH NOSTRILS 3 (THREE) TIMES DAILY 01/20/24  Yes Iva Marty Saltness, MD  lisinopril  (ZESTRIL ) 40 MG tablet TAKE 1 TABLET BY MOUTH EVERY DAY 03/17/24  Yes Levora Reyes SAUNDERS, MD  loratadine  (ALLERGY) 10 MG tablet Take 1 tablet (10 mg total) by mouth daily. Alegra D 12/29/22  Yes Iva Marty Saltness, MD  metFORMIN  (GLUCOPHAGE ) 500 MG tablet TAKE 1 TABLET  BY MOUTH EVERY DAY WITH BREAKFAST 03/17/24  Yes Levora Reyes SAUNDERS, MD  Multiple Vitamin (MULTIVITAMIN WITH MINERALS) TABS Take 1 tablet by mouth daily.   Yes [provider]  tadalafil (CIALIS) 20 MG tablet Take 20 mg by mouth daily as needed for erectile dysfunction.  10/27/19  Yes [provider]  tamsulosin  (FLOMAX ) 0.4 MG CAPS capsule Take 1 capsule (0.4 mg total) by mouth daily after supper. 02/25/24  Yes Christopher Savannah, PA-C  XOLAIR  150 MG injection INJECT 300 MG UNDER THE SKIN EVERY 28 DAYS (MAY REQUIRE RECONSTITUTION/DILUTION REFER TO PACKAGE INSERT OR MEDICAL ORDER) 10/18/23  Yes Iva Marty Saltness, MD   Social History   Socioeconomic History   Marital  status: Single    Spouse name: Not on file   Number of children: Not on file   Years of education: Not on file   Highest education level: Doctorate  Occupational History   Not on file  Tobacco Use   Smoking status: Never    Passive exposure: Never   Smokeless tobacco: Never  Vaping Use   Vaping status: Never Used  Substance and Sexual Activity   Alcohol use: No   Drug use: No   Sexual activity: Not Currently  Other Topics Concern   Not on file  Social History Narrative   Lives alone   Caffeine- varies 0-1   Social Drivers of Health   Financial Resource Strain: Low Risk  (06/06/2024)   Overall Financial Resource Strain (CARDIA)    Difficulty of Paying Living Expenses: Not hard at all  Food Insecurity: No Food Insecurity (06/06/2024)   Hunger Vital Sign    Worried About Running Out of Food in the Last Year: Never true    Ran Out of Food in the Last Year: Never true  Transportation Needs: No Transportation Needs (06/06/2024)   PRAPARE - Administrator, Civil Service (Medical): No    Lack of Transportation (Non-Medical): No  Physical Activity: Insufficiently Active (06/06/2024)   Exercise Vital Sign    Days of Exercise per Week: 2 days    Minutes of Exercise per Session: 30 min  Stress: No  Stress Concern Present (06/06/2024)   [employer] of Occupational Health - Occupational Stress Questionnaire    Feeling of Stress: Only a little  Social Connections: Moderately Integrated (06/06/2024)   Social Connection and Isolation Panel    Frequency of Communication with Friends and Family: More than three times a week    Frequency of Social Gatherings with Friends and Family: More than three times a week    Attends Religious Services: More than 4 times per year    Active Member of [employer] or Organizations: Yes    Attends Engineer, structural: More than 4 times per year    Marital Status: Never married  Intimate Partner Violence: Not on file    Review of Systems  Constitutional:  Negative for fatigue and unexpected weight change.  Eyes:  Negative for visual disturbance.  Respiratory:  Negative for cough, chest tightness and shortness of breath.   Cardiovascular:  Negative for chest pain, palpitations and leg swelling.  Gastrointestinal:  Negative for abdominal pain and blood in stool.  Neurological:  Negative for dizziness, light-headedness and headaches.     Objective:   Vitals:   06/07/24 0818  BP: 118/82  Pulse: 72  Resp: 16  Temp: 98.4 F (36.9 C)  TempSrc: Temporal  SpO2: 97%  Weight: 246 lb (111.6 kg)  Height: 6' 1 (1.854 m)     Physical Exam Vitals reviewed.  Constitutional:      Appearance: He is well-developed.  HENT:     Head: Normocephalic and atraumatic.  Neck:     Vascular: No carotid bruit or JVD.  Cardiovascular:     Rate and Rhythm: Normal rate and regular rhythm.     Heart sounds: Normal heart sounds. No murmur heard. Pulmonary:     Effort: Pulmonary effort is normal.     Breath sounds: Normal breath sounds. No rales.  Musculoskeletal:     Right lower leg: No edema.     Left lower leg: No edema.  Skin:    General: Skin is warm and dry.  Neurological:     Mental Status: He is alert and oriented to person, place, and time.   Psychiatric:        Mood and Affect: Mood normal.        Assessment & Plan:  Juan Nelson is a 58 y.o. male . Essential hypertension - Plan: Comprehensive metabolic panel with GFR  - Stable with current regimen, check labs and adjust plan accordingly.  Prediabetes - Plan: Lipid panel, Hemoglobin A1c, tirzepatide (ZEPBOUND) 2.5 MG/0.5ML injection vial, metFORMIN  (GLUCOPHAGE ) 500 MG tablet OSA (obstructive sleep apnea) - Plan: tirzepatide (ZEPBOUND) 2.5 MG/0.5ML injection vial Class 1 obesity with serious comorbidity and body mass index (BMI) of 32.0 to 32.9 in adult, unspecified obesity type - Plan: tirzepatide (ZEPBOUND) 2.5 MG/0.5ML injection vial  - Sleep apnea treated with CPAP as above, associated obesity, and I do think he would benefit from addition of zepbound to help with weight loss, along with prediabetes.  Check updated labs as above, zepbound ordered, potential side effects and risks were discussed.  If able to start med, will follow-up in 1 month to check status and discuss dosage increases if needed.  Meds ordered this encounter  Medications   tirzepatide (ZEPBOUND) 2.5 MG/0.5ML injection vial    Sig: Inject 2.5 mg into the skin once a week.    Dispense:  2 mL    Refill:  1   metFORMIN  (GLUCOPHAGE ) 500 MG tablet    Sig: Take 1 tablet (500 mg total) by mouth daily with breakfast.    Dispense:  90 tablet    Refill:  1   Patient Instructions  Thank you for coming in today.  I will order the zepbound, hopefully that will be covered with insurance given your sleep apnea but if that is not, then can be reordered in the vial form.  No change in other medications at this time. If there are any concerns on your bloodwork, I will let you know. Take care!     Signed,   Reyes Pines, MD [facility] 06/07/24 9:05 AM

## 2024-06-11 ENCOUNTER — Encounter: Payer: Self-pay | Admitting: Family Medicine

## 2024-06-13 ENCOUNTER — Ambulatory Visit
Admission: RE | Admit: 2024-06-13 | Discharge: 2024-06-13 | Disposition: A | Discharge status: Home / Self Care | Source: Ambulatory Visit | Attending: Family Medicine | Admitting: Family Medicine

## 2024-06-13 ENCOUNTER — Other Ambulatory Visit: Payer: Self-pay

## 2024-06-13 VITALS — BP 144/90 | HR 74 | Temp 97.9°F | Resp 18

## 2024-06-13 DIAGNOSIS — R1013 Epigastric pain: Secondary | ICD-10-CM | POA: Diagnosis not present

## 2024-06-13 MED ORDER — PANTOPRAZOLE SODIUM 40 MG PO TBEC: 40.0000 mg | DELAYED_RELEASE_TABLET | Freq: Every day | ORAL | 0 refills | Status: AC

## 2024-06-13 MED ORDER — LIDOCAINE VISCOUS HCL 2 % MT SOLN
15.0000 mL | Freq: Once | OROMUCOSAL | Status: AC
  Administered 2024-06-13: 15 mL via OROMUCOSAL

## 2024-06-13 MED ORDER — ALUM & MAG HYDROXIDE-SIMETH 200-200-20 MG/5ML PO SUSP
30.0000 mL | Freq: Once | ORAL | Status: AC
  Administered 2024-06-13: 30 mL via ORAL

## 2024-06-13 NOTE — ED Provider Notes (Signed)
 [facility]   [id number] 06/13/24 Arrival Time: 9148  ASSESSMENT & PLAN:  1. Epigastric pain   2. Dyspepsia    Sounds reflux-related. Does report improvement after GI cocktail. Benign abdominal exam. No indications for urgent abdominal/pelvic imaging at this time.   Meds ordered this encounter  Medications   lidocaine (XYLOCAINE) 2 % viscous mouth solution 15 mL   alum & mag hydroxide-simeth (MAALOX/MYLANTA) 200-200-20 MG/5ML suspension 30 mL   pantoprazole (PROTONIX) 40 MG tablet    Sig: Take 1 tablet (40 mg total) by mouth daily.    Dispense:  30 tablet    Refill:  0     Discharge Instructions      You have been seen today for abdominal pain. Your evaluation was not suggestive of any emergent condition requiring medical intervention at this time. However, some abdominal problems make take more time to appear. Therefore, it is very important for you to pay attention to any new symptoms or worsening of your current condition.  Please return here or to the Emergency Department immediately should you begin to feel worse in any way or have any of the following symptoms: increasing or different abdominal pain, persistent vomiting, inability to drink fluids, fevers, or shaking chills.       Follow-up Information     Schedule an appointment as soon as possible for a visit  with Levora Reyes SAUNDERS, MD.   Specialties: Family Medicine, Sports Medicine Why: For follow up. Contact information: [address] [place] [address] [phone]                Declines lab work today.  Reviewed expectations re: course of current medical issues. Questions answered. Outlined signs and symptoms indicating need for more acute intervention. Patient verbalized understanding. After Visit Summary given.   SUBJECTIVE: History from: patient. Juan Nelson is a 58 y.o. male who presents with complaint of intermittent non-radiating epigastric discomfort over  past 4-5 days; worse post-prandially and when supine. Normal appetite and PO intake. Denies n/v/d. Pain does not wake him from sleep. Distant h/o reflux. No current tx.  Past Surgical History:  Procedure Laterality Date   EXTRACORPOREAL SHOCK WAVE LITHOTRIPSY Right 11/05/2016   Procedure: RIGHT EXTRACORPOREAL SHOCK WAVE LITHOTRIPSY (ESWL);  Surgeon: [name], MD;  Location: [facility];  Service: Urology;  Laterality: Right;   GANGLION CYST EXCISION  1989   LAPAROSCOPIC GASTRIC BANDING     NEVUS EXCISION Right 04/28/2017   SPINE SURGERY      OBJECTIVE:  Vitals:   06/13/24 0910  BP: (!) 144/90  Pulse: 74  Resp: 18  Temp: 97.9 F (36.6 C)  TempSrc: Oral  SpO2: 95%    General appearance: alert, oriented, no acute distress HEENT: [place]; AT; oropharynx moist Lungs: unlabored respirations Abdomen: benign Skin: warm and dry Psychological: alert and cooperative; normal mood and affect  Labs Reviewed: Results for orders placed or performed in visit on 06/07/24  Comprehensive metabolic panel with GFR   Collection Time: 06/07/24  9:09 AM  Result Value Ref Range   Sodium 139 135 - 145 mEq/L   Potassium 3.9 3.5 - 5.1 mEq/L   Chloride 105 96 - 112 mEq/L   CO2 26 19 - 32 mEq/L   Glucose, Bld 122 (H) 70 - 99 mg/dL   BUN 20 6 - 23 mg/dL   Creatinine, Ser 9.14 0.40 - 1.50 mg/dL   Total Bilirubin 0.5 0.2 - 1.2 mg/dL   Alkaline  Phosphatase 86 39 - 117 U/L   AST 24 0 - 37 U/L   ALT 34 0 - 53 U/L   Total Protein 6.9 6.0 - 8.3 g/dL   Albumin 4.5 3.5 - 5.2 g/dL   GFR 05.07 >39.99 mL/min   Calcium 8.7 8.4 - 10.5 mg/dL  Lipid panel   Collection Time: 06/07/24  9:09 AM  Result Value Ref Range   Cholesterol 128 0 - 200 mg/dL   Triglycerides 836.9 (H) 0.0 - 149.0 mg/dL   HDL 65.19 (L) >60.99 mg/dL   VLDL 67.3 0.0 - 59.9 mg/dL   LDL Cholesterol 61 0 - 99 mg/dL   Total CHOL/HDL Ratio 4    NonHDL 93.24   Hemoglobin A1c   Collection Time: 06/07/24  9:09 AM  Result Value Ref Range   Hgb  A1c MFr Bld 6.2 4.6 - 6.5 %   Labs Reviewed - No data to display  Imaging: No results found.   Allergies  Allergen Reactions   Other Anaphylaxis    Pumpkin Seeds   Pumpkin Seed Hives and Swelling    Swelling of throat, tongue and lips. Swelling of throat, tongue and lips. Swelling of throat, tongue and lips.   Sesame Seed (Diagnostic) Anaphylaxis and Other (See Comments)   Bee Venom Swelling   Insect Extract Hives, Itching and Swelling   Mobic  [Meloxicam ] Nausea Only    just didn't feel well   Sesame Oil     Other Reaction(s): Not available                                               Past Medical History:  Diagnosis Date   Allergy    Arthritis    right knee   GERD (gastroesophageal reflux disease)    History of kidney stones    multople stones    Hypertension    Kidney stone    Sleep apnea    Urticaria     Social History   Socioeconomic History   Marital status: Single    Spouse name: Not on file   Number of children: Not on file   Years of education: Not on file   Highest education level: Doctorate  Occupational History   Not on file  Tobacco Use   Smoking status: Never    Passive exposure: Never   Smokeless tobacco: Never  Vaping Use   Vaping status: Never Used  Substance and Sexual Activity   Alcohol use: No   Drug use: No   Sexual activity: Not Currently  Other Topics Concern   Not on file  Social History Narrative   Lives alone   Caffeine- varies 0-1   Social Drivers of Health   Financial Resource Strain: Low Risk  (06/06/2024)   Overall Financial Resource Strain (CARDIA)    Difficulty of Paying Living Expenses: Not hard at all  Food Insecurity: No Food Insecurity (06/06/2024)   Hunger Vital Sign    Worried About Running Out of Food in the Last Year: Never true    Ran Out of Food in the Last Year: Never true  Transportation Needs: No Transportation Needs (06/06/2024)   PRAPARE - Administrator, Civil Service (Medical): No     Lack of Transportation (Non-Medical): No  Physical Activity: Insufficiently Active (06/06/2024)   Exercise Vital Sign    Days of Exercise per  Week: 2 days    Minutes of Exercise per Session: 30 min  Stress: No Stress Concern Present (06/06/2024)   [employer] of Occupational Health - Occupational Stress Questionnaire    Feeling of Stress: Only a little  Social Connections: Moderately Integrated (06/06/2024)   Social Connection and Isolation Panel    Frequency of Communication with Friends and Family: More than three times a week    Frequency of Social Gatherings with Friends and Family: More than three times a week    Attends Religious Services: More than 4 times per year    Active Member of [employer] or Organizations: Yes    Attends Engineer, structural: More than 4 times per year    Marital Status: Never married  Catering manager Violence: Not on file    Family History  Problem Relation Age of Onset   Hypertension Mother    Arthritis Mother    Hearing loss Mother    Obesity Mother    Vision loss Mother    Diabetes Father    Obesity Father    Stroke Father    Cancer Maternal Grandmother    Anxiety disorder Maternal Grandmother    Arthritis Maternal Grandmother    Heart disease Maternal Grandmother    Hypertension Maternal Grandmother    Depression Maternal Grandfather    Alzheimer's disease Paternal Grandmother    Cancer Paternal Apolinar Rolinda Rogue, MD 06/13/24 1328

## 2024-06-13 NOTE — ED Triage Notes (Signed)
 Pt sts pain in upper abd/esophagus area x 5 days worse when eating or lying flat; pt sts hx of lap band; sts pain feels like spasm or reflux

## 2024-06-13 NOTE — Discharge Instructions (Signed)
 You have been seen today for abdominal pain. Your evaluation was not suggestive of any emergent condition requiring medical intervention at this time. However, some abdominal problems make take more time to appear. Therefore, it is very important for you to pay attention to any new symptoms or worsening of your current condition.  Please return here or to the Emergency Department immediately should you begin to feel worse in any way or have any of the following symptoms: increasing or different abdominal pain, persistent vomiting, inability to drink fluids, fevers, or shaking chills.

## 2024-06-14 ENCOUNTER — Ambulatory Visit: Payer: Self-pay | Admitting: Family Medicine

## 2024-06-22 ENCOUNTER — Ambulatory Visit (INDEPENDENT_AMBULATORY_CARE_PROVIDER_SITE_OTHER)

## 2024-06-22 DIAGNOSIS — L501 Idiopathic urticaria: Secondary | ICD-10-CM | POA: Diagnosis not present

## 2024-07-04 ENCOUNTER — Other Ambulatory Visit: Payer: Self-pay | Admitting: Allergy & Immunology

## 2024-07-12 ENCOUNTER — Ambulatory Visit: Admitting: Family Medicine

## 2024-07-21 ENCOUNTER — Encounter: Payer: Self-pay | Admitting: Family Medicine

## 2024-07-21 NOTE — Telephone Encounter (Signed)
I believe this is an FYI

## 2024-07-24 DIAGNOSIS — M25511 Pain in right shoulder: Secondary | ICD-10-CM | POA: Diagnosis not present

## 2024-08-02 ENCOUNTER — Ambulatory Visit

## 2024-08-02 DIAGNOSIS — L501 Idiopathic urticaria: Secondary | ICD-10-CM

## 2024-08-03 DIAGNOSIS — Z125 Encounter for screening for malignant neoplasm of prostate: Secondary | ICD-10-CM | POA: Diagnosis not present

## 2024-08-07 DIAGNOSIS — G4733 Obstructive sleep apnea (adult) (pediatric): Secondary | ICD-10-CM | POA: Diagnosis not present

## 2024-08-10 DIAGNOSIS — N200 Calculus of kidney: Secondary | ICD-10-CM | POA: Diagnosis not present

## 2024-08-10 DIAGNOSIS — R350 Frequency of micturition: Secondary | ICD-10-CM | POA: Diagnosis not present

## 2024-08-10 DIAGNOSIS — R3914 Feeling of incomplete bladder emptying: Secondary | ICD-10-CM | POA: Diagnosis not present

## 2024-08-10 DIAGNOSIS — N401 Enlarged prostate with lower urinary tract symptoms: Secondary | ICD-10-CM | POA: Diagnosis not present

## 2024-08-10 DIAGNOSIS — R351 Nocturia: Secondary | ICD-10-CM | POA: Diagnosis not present

## 2024-08-25 ENCOUNTER — Encounter: Payer: BC Managed Care – PPO | Admitting: Family Medicine

## 2024-08-25 ENCOUNTER — Other Ambulatory Visit: Payer: Self-pay | Admitting: Allergy & Immunology

## 2024-08-27 ENCOUNTER — Telehealth: Payer: Self-pay | Admitting: Family Medicine

## 2024-08-27 NOTE — Telephone Encounter (Signed)
 Called patient, apologized for the need to reschedule tomorrow morning's appointment due to weather and travel delays.  He understood.  Advised that a staff member will be reaching out to him in the morning to reschedule his appointment.

## 2024-08-28 ENCOUNTER — Other Ambulatory Visit: Payer: Self-pay | Admitting: Family Medicine

## 2024-08-28 ENCOUNTER — Encounter: Admitting: Family Medicine

## 2024-08-28 DIAGNOSIS — I10 Essential (primary) hypertension: Secondary | ICD-10-CM

## 2024-08-28 DIAGNOSIS — Z8739 Personal history of other diseases of the musculoskeletal system and connective tissue: Secondary | ICD-10-CM

## 2024-09-07 ENCOUNTER — Ambulatory Visit (INDEPENDENT_AMBULATORY_CARE_PROVIDER_SITE_OTHER): Admitting: Otolaryngology

## 2024-09-13 ENCOUNTER — Ambulatory Visit: Admitting: *Deleted

## 2024-09-13 ENCOUNTER — Ambulatory Visit

## 2024-09-13 VITALS — Ht 73.0 in | Wt 251.0 lb

## 2024-09-13 DIAGNOSIS — L501 Idiopathic urticaria: Secondary | ICD-10-CM | POA: Diagnosis not present

## 2024-09-13 DIAGNOSIS — Z1211 Encounter for screening for malignant neoplasm of colon: Secondary | ICD-10-CM

## 2024-09-13 DIAGNOSIS — Z800 Family history of malignant neoplasm of digestive organs: Secondary | ICD-10-CM

## 2024-09-13 MED ORDER — NA SULFATE-K SULFATE-MG SULF 17.5-3.13-1.6 GM/177ML PO SOLN: 1.0000 | Freq: Once | ORAL | 0 refills | Status: AC

## 2024-09-13 NOTE — Progress Notes (Signed)
 Pt's name and DOB verified at the beginning of the pre-visit with 2 identifiers    Pt denies any difficulty with ambulating,sitting, laying down or rolling side to side  Pt has no issues moving head neck or swallowing  No egg or soy allergy known to patient   No issues known to pt with past sedation  No FH of Malignant Hyperthermia  Pt is not on home 02   Pt is not on blood thinners   Pt denies issues with constipation   Pt is not on dialysis  Pt denise any abnormal heart rhythms   Pt denies any upcoming cardiac testing  Patient's chart reviewed by Norleen Schillings CNRA prior to pre-visit and patient appropriate for the [facility].  Pre-visit completed and red dot placed by patient's name on their procedure day (on provider's schedule).    Visit by phone  Pt states weight is 251 lb  Pt given  both [facility] main # and MD on call # prior to instructions.  Informed pt to come in at the time discussed and is shown on PV instructions.  Pt instructed to use Single[employer].com or GoodRx for a price reduction on prep  Instructed pt where to find PV instructions in My Chart. . Instructed pt on all aspects of written instructions including med holds clothing to wear and foods to eat and not eat as well as after procedure legal restrictions and to call MD on call if needed.. Pt states understanding. Instructed pt to review instructions again prior to procedure and call main # given if has any questions or any issues. Pt states they will.

## 2024-09-14 ENCOUNTER — Ambulatory Visit: Admitting: Family Medicine

## 2024-09-14 ENCOUNTER — Telehealth: Admitting: Physician Assistant

## 2024-09-14 ENCOUNTER — Encounter: Payer: Self-pay | Admitting: Family Medicine

## 2024-09-14 VITALS — BP 120/82 | HR 78 | Temp 98.3°F | Resp 16 | Ht 73.0 in | Wt 249.8 lb

## 2024-09-14 DIAGNOSIS — Z0000 Encounter for general adult medical examination without abnormal findings: Secondary | ICD-10-CM | POA: Diagnosis not present

## 2024-09-14 DIAGNOSIS — Z8739 Personal history of other diseases of the musculoskeletal system and connective tissue: Secondary | ICD-10-CM

## 2024-09-14 DIAGNOSIS — T300 Burn of unspecified body region, unspecified degree: Secondary | ICD-10-CM | POA: Diagnosis not present

## 2024-09-14 DIAGNOSIS — K7581 Nonalcoholic steatohepatitis (NASH): Secondary | ICD-10-CM

## 2024-09-14 DIAGNOSIS — G4733 Obstructive sleep apnea (adult) (pediatric): Secondary | ICD-10-CM | POA: Diagnosis not present

## 2024-09-14 DIAGNOSIS — R7303 Prediabetes: Secondary | ICD-10-CM

## 2024-09-14 DIAGNOSIS — E66811 Obesity, class 1: Secondary | ICD-10-CM

## 2024-09-14 DIAGNOSIS — Z6832 Body mass index (BMI) 32.0-32.9, adult: Secondary | ICD-10-CM

## 2024-09-14 DIAGNOSIS — I10 Essential (primary) hypertension: Secondary | ICD-10-CM | POA: Diagnosis not present

## 2024-09-14 LAB — COMPREHENSIVE METABOLIC PANEL WITH GFR
ALT: 52 U/L (ref 3–53)
AST: 31 U/L (ref 5–37)
Albumin: 4.5 g/dL (ref 3.5–5.2)
Alkaline Phosphatase: 101 U/L (ref 39–117)
BUN: 14 mg/dL (ref 6–23)
CO2: 28 meq/L (ref 19–32)
Calcium: 9.1 mg/dL (ref 8.4–10.5)
Chloride: 106 meq/L (ref 96–112)
Creatinine, Ser: 0.88 mg/dL (ref 0.40–1.50)
GFR: 93.75 mL/min
Glucose, Bld: 113 mg/dL — ABNORMAL HIGH (ref 70–99)
Potassium: 4.3 meq/L (ref 3.5–5.1)
Sodium: 140 meq/L (ref 135–145)
Total Bilirubin: 0.5 mg/dL (ref 0.2–1.2)
Total Protein: 6.9 g/dL (ref 6.0–8.3)

## 2024-09-14 LAB — HEMOGLOBIN A1C: Hgb A1c MFr Bld: 6.3 % (ref 4.6–6.5)

## 2024-09-14 MED ORDER — AMLODIPINE BESYLATE 2.5 MG PO TABS: 2.5000 mg | ORAL_TABLET | Freq: Every day | ORAL | 1 refills | Status: AC

## 2024-09-14 MED ORDER — LISINOPRIL 40 MG PO TABS: 40.0000 mg | ORAL_TABLET | Freq: Every day | ORAL | 1 refills | Status: AC

## 2024-09-14 MED ORDER — METFORMIN HCL 500 MG PO TABS: 500.0000 mg | ORAL_TABLET | Freq: Every day | ORAL | 1 refills | Status: AC

## 2024-09-14 MED ORDER — ALLOPURINOL 100 MG PO TABS: 100.0000 mg | ORAL_TABLET | Freq: Every day | ORAL | 1 refills | Status: AC

## 2024-09-14 NOTE — Patient Instructions (Addendum)
 Thank you for coming in today. No change in medications at this time. If there are any concerns on your bloodwork, I will let you know. Take care! Preventive Care 61-61 Years Old, Male Preventive care refers to lifestyle choices and visits with your health care provider that can promote health and wellness. Preventive care visits are also called wellness exams. What can I expect for my preventive care visit? Counseling During your preventive care visit, your health care provider may ask about your: Medical history, including: Past medical problems. Family medical history. Current health, including: Emotional well-being. Home life and relationship well-being. Sexual activity. Lifestyle, including: Alcohol, nicotine or tobacco, and drug use. Access to firearms. Diet, exercise, and sleep habits. Safety issues such as seatbelt and bike helmet use. Sunscreen use. Work and work Astronomer. Physical exam Your health care provider will check your: Height and weight. These may be used to calculate your BMI (body mass index). BMI is a measurement that tells if you are at a healthy weight. Waist circumference. This measures the distance around your waistline. This measurement also tells if you are at a healthy weight and may help predict your risk of certain diseases, such as type 2 diabetes and high blood pressure. Heart rate and blood pressure. Body temperature. Skin for abnormal spots. What immunizations do I need?  Vaccines are usually given at various ages, according to a schedule. Your health care provider will recommend vaccines for you based on your age, medical history, and lifestyle or other factors, such as travel or where you work. What tests do I need? Screening Your health care provider may recommend screening tests for certain conditions. This may include: Lipid and cholesterol levels. Diabetes screening. This is done by checking your blood sugar (glucose) after you have not  eaten for a while (fasting). Hepatitis B test. Hepatitis C test. HIV (human immunodeficiency virus) test. STI (sexually transmitted infection) testing, if you are at risk. Lung cancer screening. Prostate cancer screening. Colorectal cancer screening. Talk with your health care provider about your test results, treatment options, and if necessary, the need for more tests. Follow these instructions at home: Eating and drinking  Eat a diet that includes fresh fruits and vegetables, whole grains, lean protein, and low-fat dairy products. Take vitamin and mineral supplements as recommended by your health care provider. Do not drink alcohol if your health care provider tells you not to drink. If you drink alcohol: Limit how much you have to 0-2 drinks a day. Know how much alcohol is in your drink. In the [place] one drink equals one 12 oz bottle of beer (355 mL), one 5 oz glass of wine (148 mL), or one 1 oz glass of hard liquor (44 mL). Lifestyle Brush your teeth every morning and night with fluoride toothpaste. Floss one time each day. Exercise for at least 30 minutes 5 or more days each week. Do not use any products that contain nicotine or tobacco. These products include cigarettes, chewing tobacco, and vaping devices, such as e-cigarettes. If you need help quitting, ask your health care provider. Do not use drugs. If you are sexually active, practice safe sex. Use a condom or other form of protection to prevent STIs. Take aspirin  only as told by your health care provider. Make sure that you understand how much to take and what form to take. Work with your health care provider to find out whether it is safe and beneficial for you to take aspirin  daily. Find healthy ways to manage  stress, such as: Meditation, yoga, or listening to music. Journaling. Talking to a trusted person. Spending time with friends and family. Minimize exposure to UV radiation to reduce your risk of skin  cancer. Safety Always wear your seat belt while driving or riding in a vehicle. Do not drive: If you have been drinking alcohol. Do not ride with someone who has been drinking. When you are tired or distracted. While texting. If you have been using any mind-altering substances or drugs. Wear a helmet and other protective equipment during sports activities. If you have firearms in your house, make sure you follow all gun safety procedures. What's next? Go to your health care provider once a year for an annual wellness visit. Ask your health care provider how often you should have your eyes and teeth checked. Stay up to date on all vaccines. This information is not intended to replace advice given to you by your health care provider. Make sure you discuss any questions you have with your health care provider. Document Revised: 02/12/2021 Document Reviewed: 02/12/2021 [employer] Patient Education  2024 [employer].

## 2024-09-14 NOTE — Progress Notes (Signed)
 "  Subjective:  Patient ID: Juan Nelson, male    DOB: [birth year]  Age: 59 y.o. MRN: [id number]  CC:  Chief Complaint  Patient presents with   Annual Exam    No questions or concerns.     HPI Juan Nelson presents for Annual Exam  PCP: me Allergy, Dr. 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  Ortho, Dr. Melita, right shoulder pain, visit in November. doing better.  Gastroenterology/hepatology [facility], Dr. Levern, MASH with moderate fibrosis, improved after weight loss, no pathologic fibrosis and improvement of steatohepatitis features on repeat biopsy.  FibroScan on 09/06/2024 unfortunately indicated advanced fibrosis, repeated several times with high LSM.  Plan for MRI with liver elastography to assess more accurately.  In February. Started on Wegovy  given NASH, 0.25 mg weekly written. Was able to start at end of November, meds covered. Weight 258 down to 249 today. Still on 0.25 mg for now. Some initial nausea - better now.  Sleep specialist, Amy Lomax, visit in 06/19/2024, OSA on CPAP - still on cpap. Feels well rested.  ENT, Dr. Karis, with history of high-frequency sensorineural hearing loss, chronic rhinitis and eustachian tube dysfunction, has upcoming appt. Ophthalmology, Dr. Valdemar with history of hypertensive retinopathy, cataracts. Urology, Dr. Selma with history of BPH, LUTS, urolithiasis.  PSA screening through urology.Appointment noted from 08/10/2024.  Treated with tamsulosin  for kidney stone.  PSA 1.6 in December 2025. Doing well.   Prediabetes: Prior metformin , recent start of Wegovy  as above from hepatology.  Prior Lap-Band.  Regular exercise with walking, weight training, and has improved diet previously. Now on Wegovy  0.25mg  weekly.  Lab Results  Component Value Date   HGBA1C 6.2 06/07/2024   Wt Readings from Last 3 Encounters:  09/14/24 249 lb 12.8 oz (113.3 kg)  09/13/24 251 lb (113.9  kg)  06/07/24 246 lb (111.6 kg)   Hypertension: Lisinopril  40 mg daily, amlodipine  2.5 mg and has been seen by nephrology previously.no new med side effects.  Home readings:120/70 range.  BP Readings from Last 3 Encounters:  09/14/24 120/82  06/13/24 (!) 144/90  06/07/24 118/82   Lab Results  Component Value Date   CREATININE 0.85 06/07/2024   Gout: Last flare: None recently Daily meds: Allopurinol  100 mg daily. Prn med: Colchicine  if needed. Lab Results  Component Value Date   LABURIC 6.2 08/01/2021       09/14/2024   11:02 AM 06/07/2024    8:21 AM 08/26/2023   10:18 AM 01/04/2023    9:52 AM 12/21/2022    8:37 AM  Depression screen PHQ 2/9  Decreased Interest 0 0 0 0 0  Down, Depressed, Hopeless 0 0 0 0 0  PHQ - 2 Score 0 0 0 0 0  Altered sleeping 0 0 0  0  Tired, decreased energy 1 0 0 0 1  Change in appetite 0 3 2 2 1   Feeling bad or failure about yourself  0 0 0 0 0  Trouble concentrating 0 0 0  0  Moving slowly or fidgety/restless 0 0 0 0 0  Suicidal thoughts 0 0 0 0 0  PHQ-9 Score 1 3  2   2    Difficult doing work/chores Not difficult at all Not difficult at all  Not difficult at all      Data saved with a previous flowsheet row definition    Health Maintenance  Topic Date Due   Colonoscopy  09/03/2024  Pneumococcal Vaccine: 50+ Years (1 of 1 - PCV) 09/14/2025 (Originally 08/21/2014)   DTaP/Tdap/Td (3 - Td or Tdap) 02/11/2033   Influenza Vaccine  Completed   Hepatitis B Vaccines 19-59 Average Risk  Completed   HPV VACCINES (No Doses Required) Completed   COVID-19 Vaccine  Completed   Hepatitis C Screening  Completed   HIV Screening  Completed   Zoster Vaccines- Shingrix  Completed   Meningococcal B Vaccine  Aged Out  Colonoscopy in 2016, repeat 7 to 10 years. Scheduled in few weeks.  Prostate:tested by urology as above, normal recent testing.  Lab Results  Component Value Date   PSA1 0.6 07/05/2019   PSA1 0.7 06/27/2018   PSA 1.01 08/26/2023   PSA  0.66 01/24/2016      Immunization History  Administered Date(s) Administered   Hepatitis B 12/17/2011, 01/19/2012, 06/21/2012   Influenza Inj Mdck Quad Pf 05/21/2018   Influenza, Mdck, Trivalent,PF 6+ MOS(egg free) 04/29/2023   Influenza, Quadrivalent, Recombinant, Inj, Pf 05/06/2019   Influenza, Seasonal, Injecte, Preservative Fre 04/19/2024   Influenza,inj,Quad PF,6+ Mos 06/03/2017, 05/03/2020, 04/23/2022   Influenza-Unspecified 05/27/2015, 05/01/2016, 05/21/2018, 04/01/2023   PFIZER(Purple Top)SARS-COV-2 Vaccination 11/04/2019, 11/27/2019, 05/28/2020, 11/29/2020   Pfizer(Comirnaty)Fall Seasonal Vaccine 12 years and older 05/17/2024   Tdap 06/03/2017, 02/12/2023   Zoster Recombinant(Shingrix) 01/29/2021, 08/01/2021  PNA vaccine - planning on getting, had Xolair  yesterday - delaying vaccine for 1 week - at pharmacy   No results found.optho yearly - February.   Dental: every 6 months, appt this month.   Alcohol: none  Tobacco: none  Exercise: cardio 5k steps per day. Weights 3 times per week - low weights.    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   Primary hypertension 12/25/2020   GERD with apnea 12/25/2020   Other seasonal allergic rhinitis 03/17/2017   Hx of laparoscopic adjustable gastric banding 02/08/2017   History of kidney stones 04/18/2014   Essential hypertension 04/18/2014   LFT elevation 10/08/2012   Hyperglycemia 10/08/2012   Past Medical History:  Diagnosis Date   Allergy    Arthritis    right knee   GERD (gastroesophageal reflux disease)    History of  kidney stones    multople stones    Hyperlipidemia    Hypertension    Kidney stone    Sleep apnea    Urticaria    Past Surgical History:  Procedure Laterality Date   COLONOSCOPY     first one done at [facility] no polyps per pt   EXTRACORPOREAL SHOCK WAVE LITHOTRIPSY Right 11/05/2016   Procedure: RIGHT EXTRACORPOREAL SHOCK WAVE LITHOTRIPSY (ESWL);  Surgeon: [name], MD;  Location: [facility];  Service: Urology;  Laterality: Right;   GANGLION CYST EXCISION  1989   LAPAROSCOPIC GASTRIC BANDING     NEVUS EXCISION Right 04/28/2017   SPINE SURGERY     Allergies[1] Prior to Admission medications  Medication Sig Start Date End Date Taking? Authorizing Provider  acetaminophen  (TYLENOL ) 500 MG tablet Take 500 mg by mouth every 6 (six) hours as needed for headache.   Yes [provider]  allopurinol  (ZYLOPRIM ) 100  MG tablet TAKE 1 TABLET BY MOUTH EVERY DAY 08/28/24  Yes Levora Reyes SAUNDERS, MD  amLODipine  (NORVASC ) 2.5 MG tablet TAKE 1 TABLET BY MOUTH EVERY DAY 04/13/24  Yes Levora Reyes SAUNDERS, MD  aspirin EC 81 MG tablet Take 81 mg by mouth daily. Swallow whole.   Yes [provider]  cholecalciferol (VITAMIN D ) 1000 UNITS tablet Take 5,000 Units by mouth daily.   Yes [provider]  Cobalamin Combinations (B-12) [phone] MCG SUBL Place under the tongue daily at 12 noon.   Yes [provider]  colchicine  0.6 MG tablet Take two tabs once at the first sign of a gout flare and then one an hour later. Repeat in 24 hours if needed. 08/16/17  Yes Gretta Ozell CROME, PA-C  cyclobenzaprine (FLEXERIL) 10 MG tablet TAKE 1 TABLET(S) EVERY 6-8 HOURS AS NEEDED FOR SPASM Patient taking differently: Take 10 mg by mouth as needed. 06/09/22  Yes [provider]  EPINEPHrine  (AUVI-Q ) 0.3 mg/0.3 mL IJ SOAJ injection Inject 0.3 mg into the muscle as needed for anaphylaxis. 12/29/22  Yes Iva Marty Saltness, MD  Fiber Adult Gummies 2 g CHEW Chew by mouth daily at 12 noon.    Yes [provider]  fluticasone  (FLONASE ) 50 MCG/ACT nasal spray SPRAY 2 SPRAYS INTO EACH NOSTRIL EVERY DAY Patient taking differently: as needed. 08/28/24  Yes Iva Marty Saltness, MD  glucose blood [facility] VERIO) test strip Use as instructed 08/26/23  Yes Levora Reyes SAUNDERS, MD  glucose blood test strip See admin instructions.   Yes [provider]  ipratropium (ATROVENT ) 0.03 % nasal spray PLACE 2 SPRAYS INTO BOTH NOSTRILS 3 (THREE) TIMES DAILY Patient taking differently: Place 2 sprays into both nostrils as needed. 01/20/24  Yes Iva Marty Saltness, MD  lisinopril  (ZESTRIL ) 40 MG tablet TAKE 1 TABLET BY MOUTH EVERY DAY 08/28/24  Yes Levora Reyes SAUNDERS, MD  loratadine  (ALLERGY) 10 MG tablet Take 1 tablet (10 mg total) by mouth daily. Alegra D Patient taking differently: Take 10 mg by mouth as needed. Alegra D 12/29/22  Yes Iva Marty Saltness, MD  metFORMIN  (GLUCOPHAGE ) 500 MG tablet Take 1 tablet (500 mg total) by mouth daily with breakfast. 06/07/24  Yes Levora Reyes SAUNDERS, MD  Na Sulfate-K Sulfate-Mg Sulfate concentrate (SUPREP) 17.5-3.13-1.6 GM/177ML SOLN Take 1 kit by mouth once. 09/13/24  Yes [provider]  pantoprazole  (PROTONIX ) 40 MG tablet Take 1 tablet (40 mg total) by mouth daily. Patient taking differently: Take 40 mg by mouth as needed. 06/13/24  Yes Hagler, Redell, MD  tadalafil (CIALIS) 20 MG tablet Take 20 mg by mouth daily as needed for erectile dysfunction.  10/27/19  Yes [provider]  tamsulosin  (FLOMAX ) 0.4 MG CAPS capsule Take 1 capsule (0.4 mg total) by mouth daily after supper. 02/25/24  Yes Christopher Savannah, PA-C  WEGOVY  0.25 MG/0.5ML SOAJ SQ injection once a week.   Yes [provider]  XOLAIR  150 MG injection INJECT 300 MG UNDER THE SKIN EVERY 28 DAYS (MAY REQUIRE RECONSTITUTION/DILUTION REFER TO PACKAGE INSERT OR MEDICAL ORDER) 10/18/23  Yes Iva Marty Saltness, MD  EPINEPHrine  0.3 mg/0.3 mL IJ SOAJ injection Inject 0.3 mg  into the muscle as needed for anaphylaxis. Patient not taking: Reported on 09/14/2024 05/08/24   Iva Marty Saltness, MD  Multiple Vitamin (MULTIVITAMIN WITH MINERALS) TABS Take 1 tablet by mouth daily.    [provider]  tirzepatide  (ZEPBOUND ) 2.5 MG/0.5ML Pen Inject 2.5 mg into the skin once a week. Patient not  taking: Reported on 09/14/2024 06/07/24   Levora Reyes SAUNDERS, MD   Social History   Socioeconomic History   Marital status: Single    Spouse name: Not on file   Number of children: Not on file   Years of education: Not on file   Highest education level: Doctorate  Occupational History   Not on file  Tobacco Use   Smoking status: Never    Passive exposure: Never   Smokeless tobacco: Never  Vaping Use   Vaping status: Never Used  Substance and Sexual Activity   Alcohol use: Never   Drug use: Never   Sexual activity: Yes    Birth control/protection: Condom  Other Topics Concern   Not on file  Social History Narrative   Lives alone   Caffeine- varies 0-1   Social Drivers of Health   Tobacco Use: Low Risk (09/14/2024)   Patient History    Smoking Tobacco Use: Never    Smokeless Tobacco Use: Never    Passive Exposure: Never  Financial Resource Strain: Low Risk (08/25/2024)   Overall Financial Resource Strain (CARDIA)    Difficulty of Paying Living Expenses: Not hard at all  Food Insecurity: No Food Insecurity (08/25/2024)   Epic    Worried About Radiation Protection Practitioner of Food in the Last Year: Never true    Ran Out of Food in the Last Year: Never true  Transportation Needs: No Transportation Needs (08/25/2024)   Epic    Lack of Transportation (Medical): No    Lack of Transportation (Non-Medical): No  Physical Activity: Sufficiently Active (08/25/2024)   Exercise Vital Sign    Days of Exercise per Week: 4 days    Minutes of Exercise per Session: 50 min  Recent Concern: Physical Activity - Insufficiently Active (06/06/2024)   Exercise Vital Sign    Days of Exercise  per Week: 2 days    Minutes of Exercise per Session: 30 min  Stress: No Stress Concern Present (08/25/2024)   [employer] of Occupational Health - Occupational Stress Questionnaire    Feeling of Stress: Only a little  Social Connections: Moderately Isolated (08/25/2024)   Social Connection and Isolation Panel    Frequency of Communication with Friends and Family: More than three times a week    Frequency of Social Gatherings with Friends and Family: Twice a week    Attends Religious Services: More than 4 times per year    Active Member of [employer] or Organizations: No    Attends Engineer, Structural: Not on file    Marital Status: Never married  Intimate Partner Violence: Not on file  Depression (PHQ2-9): Low Risk (09/14/2024)   Depression (PHQ2-9)    PHQ-2 Score: 1  Alcohol Screen: Not on file  Housing: Low Risk (08/25/2024)   Epic    Unable to Pay for Housing in the Last Year: No    Number of Times Moved in the Last Year: 0    Homeless in the Last Year: No  Utilities: Not on file  Health Literacy: Not on file    Review of Systems 13 point review of systems per patient health survey noted.  Negative other than as indicated above or in HPI.    Objective:   Vitals:   09/14/24 1058  BP: 120/82  Pulse: 78  Resp: 16  Temp: 98.3 F (36.8 C)  TempSrc: Temporal  SpO2: 97%  Weight: 249 lb 12.8 oz (113.3 kg)  Height: 6' 1 (1.854 m)     Physical  Exam Vitals reviewed.  Constitutional:      Appearance: He is well-developed.  HENT:     Head: Normocephalic and atraumatic.  Neck:     Vascular: No carotid bruit or JVD.  Cardiovascular:     Rate and Rhythm: Normal rate and regular rhythm.     Heart sounds: Normal heart sounds. No murmur heard. Pulmonary:     Effort: Pulmonary effort is normal.     Breath sounds: Normal breath sounds. No rales.  Musculoskeletal:     Right lower leg: No edema.     Left lower leg: No edema.  Skin:    General: Skin is warm and  dry.  Neurological:     Mental Status: He is alert and oriented to person, place, and time.  Psychiatric:        Mood and Affect: Mood normal.     Assessment & Plan:  Juan Nelson is a 59 y.o. male . Annual physical exam  - -anticipatory guidance as below in AVS, screening labs above. Health maintenance items as above in HPI discussed/recommended as applicable.   History of gout - Plan: allopurinol  (ZYLOPRIM ) 100 MG tablet  - Stable without recurrence, continue allopurinol  for prevention.  Essential hypertension - Plan: amLODipine  (NORVASC ) 2.5 MG tablet, lisinopril  (ZESTRIL ) 40 MG tablet  - Stable on current regimen, continue same, labs and adjust plan accordingly.  Prediabetes - Plan: metFORMIN  (GLUCOPHAGE ) 500 MG tablet, Comprehensive metabolic panel with GFR, Hemoglobin A1c  - Now on Wegovy , anticipate further weight loss and improved readings.  Check A1c and adjust plan accordingly, continue metformin  same dose for now.  OSA (obstructive sleep apnea)  - Compliant with CPAP, controlled symptoms, continue same.  Class 1 obesity with serious comorbidity and body mass index (BMI) of 32.0 to 32.9 in adult, unspecified obesity type Metabolic dysfunction-associated steatohepatitis (MASH)  - Weight is improving with use of Wegovy , plan for MRI with elastography under the care of hepatology.  Continue follow-up with specialist.  Tolerating Wegovy  current dose, he plans to discuss timing and dosage increases with GI.  Meds ordered this encounter  Medications   allopurinol  (ZYLOPRIM ) 100 MG tablet    Sig: Take 1 tablet (100 mg total) by mouth daily.    Dispense:  90 tablet    Refill:  1   amLODipine  (NORVASC ) 2.5 MG tablet    Sig: Take 1 tablet (2.5 mg total) by mouth daily.    Dispense:  90 tablet    Refill:  1   lisinopril  (ZESTRIL ) 40 MG tablet    Sig: Take 1 tablet (40 mg total) by mouth daily.    Dispense:  90 tablet    Refill:  1   metFORMIN  (GLUCOPHAGE ) 500 MG  tablet    Sig: Take 1 tablet (500 mg total) by mouth daily with breakfast.    Dispense:  90 tablet    Refill:  1   Patient Instructions  Thank you for coming in today. No change in medications at this time. If there are any concerns on your bloodwork, I will let you know. Take care! Preventive Care 16-60 Years Old, Male Preventive care refers to lifestyle choices and visits with your health care provider that can promote health and wellness. Preventive care visits are also called wellness exams. What can I expect for my preventive care visit? Counseling During your preventive care visit, your health care provider may ask about your: Medical history, including: Past medical problems. Family medical history. Current health, including:  Emotional well-being. Home life and relationship well-being. Sexual activity. Lifestyle, including: Alcohol, nicotine or tobacco, and drug use. Access to firearms. Diet, exercise, and sleep habits. Safety issues such as seatbelt and bike helmet use. Sunscreen use. Work and work astronomer. Physical exam Your health care provider will check your: Height and weight. These may be used to calculate your BMI (body mass index). BMI is a measurement that tells if you are at a healthy weight. Waist circumference. This measures the distance around your waistline. This measurement also tells if you are at a healthy weight and may help predict your risk of certain diseases, such as type 2 diabetes and high blood pressure. Heart rate and blood pressure. Body temperature. Skin for abnormal spots. What immunizations do I need?  Vaccines are usually given at various ages, according to a schedule. Your health care provider will recommend vaccines for you based on your age, medical history, and lifestyle or other factors, such as travel or where you work. What tests do I need? Screening Your health care provider may recommend screening tests for certain conditions.  This may include: Lipid and cholesterol levels. Diabetes screening. This is done by checking your blood sugar (glucose) after you have not eaten for a while (fasting). Hepatitis B test. Hepatitis C test. HIV (human immunodeficiency virus) test. STI (sexually transmitted infection) testing, if you are at risk. Lung cancer screening. Prostate cancer screening. Colorectal cancer screening. Talk with your health care provider about your test results, treatment options, and if necessary, the need for more tests. Follow these instructions at home: Eating and drinking  Eat a diet that includes fresh fruits and vegetables, whole grains, lean protein, and low-fat dairy products. Take vitamin and mineral supplements as recommended by your health care provider. Do not drink alcohol if your health care provider tells you not to drink. If you drink alcohol: Limit how much you have to 0-2 drinks a day. Know how much alcohol is in your drink. In the [place] one drink equals one 12 oz bottle of beer (355 mL), one 5 oz glass of wine (148 mL), or one 1 oz glass of hard liquor (44 mL). Lifestyle Brush your teeth every morning and night with fluoride toothpaste. Floss one time each day. Exercise for at least 30 minutes 5 or more days each week. Do not use any products that contain nicotine or tobacco. These products include cigarettes, chewing tobacco, and vaping devices, such as e-cigarettes. If you need help quitting, ask your health care provider. Do not use drugs. If you are sexually active, practice safe sex. Use a condom or other form of protection to prevent STIs. Take aspirin only as told by your health care provider. Make sure that you understand how much to take and what form to take. Work with your health care provider to find out whether it is safe and beneficial for you to take aspirin daily. Find healthy ways to manage stress, such as: Meditation, yoga, or listening to  music. Journaling. Talking to a trusted person. Spending time with friends and family. Minimize exposure to UV radiation to reduce your risk of skin cancer. Safety Always wear your seat belt while driving or riding in a vehicle. Do not drive: If you have been drinking alcohol. Do not ride with someone who has been drinking. When you are tired or distracted. While texting. If you have been using any mind-altering substances or drugs. Wear a helmet and other protective equipment during sports activities. If you have  firearms in your house, make sure you follow all gun safety procedures. What's next? Go to your health care provider once a year for an annual wellness visit. Ask your health care provider how often you should have your eyes and teeth checked. Stay up to date on all vaccines. This information is not intended to replace advice given to you by your health care provider. Make sure you discuss any questions you have with your health care provider. Document Revised: 02/12/2021 Document Reviewed: 02/12/2021 Elsevier Patient Education  2024 Elsevier Inc.    Signed,   Reyes Pines, MD [facility], [facility] 09/14/24 12:04 PM      [1]  Allergies Allergen Reactions   Other Anaphylaxis    Pumpkin Seeds   Pumpkin Seed Hives and Swelling    Swelling of throat, tongue and lips. Swelling of throat, tongue and lips. Swelling of throat, tongue and lips.   Sesame Seed (Diagnostic) Anaphylaxis and Other (See Comments)   Bee Venom Swelling   Insect Extract Hives, Itching and Swelling   Mobic  [Meloxicam ] Nausea Only    just didn't feel well   Molds & Smuts Other (See Comments)    mold extract   Sesame Oil     Other Reaction(s): Not available   "

## 2024-09-14 NOTE — Progress Notes (Signed)
 E-VISIT for Burn  We are sorry that you are not feeling well. Here is how we plan to help!  Based on what you have shared with me it looks like you may have: 2nd degree burn with or without blisters.   Second-degree burns take 14-21 days to heal.  After the burn has healed the skin may look a little darker or lighter than before.  Burns are a type of painful wound caused by thermal, electrical, chemical, or electromagnetic energy.  Smoking and open flame are the leading cause of burn injury for older adults.  Scalding from a hot liquid is the leading cause of burn injury for children.  Both infants and older adults are the greatest risk for burn injury.  First degree burns effect only the outer layers of the skin.  The burn may be red and painful but the skin does not blister.  Long term tissue damage is rare.  Third degree burns destroy both layers of the skin and can also penetrate to underlying  Structures.  A third degree burn may not initially hurt because nerve endings were destroyed.  All third degree burns should be evaluated in person.  HOME CARE:  1.Take Ibuprofen (Advil, Motrin) for pain relief as soon as possible.  The adult dosage is up to 600 mg every 6 hours.  Starting within 6 hours of sun exposure may greatly reduce your discomfort.  If you cannot take Ibuprofen, you may use Acetaminophen  instead. You can also alternate Ibuprofen and Tylenol  every 4 hours as needed.  Elevation of lower and upper extremity burns above the level of the heart can reduce pain and swelling for several days following the injury.   Do not take Ibuprofen if you have stomach problems, kidney disease or are pregnant. Do not take Ibuprofen if you have been told by your doctor or pharmacist to avoid this class of drugs. Do not take Acetaminophen  if you have liver disease. Read the package warnings on any medication that you take!  2. For second- or third-degree burn with painful blistering, you can use an  over-the-counter product called Burn Jel Plus Pain Relieving Gel, or Aloe plus Lidocaine . Apply in a thick even layer over the affected area not more than 3 to 4 times daily.  If you need to cover the area to protect it from friction of clothing, you can use Moist Burn Pads such as Hydrogel Burn Pads, which are available over the counter.  3.  Apply cool compresses (ice/ice pack wrapped in a thin towel) to the burned areas several times a day.   Direct application of ice or iced water should be avoided as this can increase pain and tissue injury.  4.  We suggest washing minor burn wounds using only mild soap and tap water during dressing changes.   5.  Drink plenty of water.    6.  For any broken blisters: Trim off the dead skin with fine scissors.  It is wise to clean the scissors with alcohol before use. Apply antibiotic ointments to the blister.  Apply twice a day for three days.  There are triple antibiotic ointments with topical pain relievers available at stores over the counter. Caution: leave intact blisters alone.  They protect the skin and will allow it to heal.  GET HELP RIGHT AWAY IF:  The area of the burn is larger than 4 palms of our hand. You become short of breath. The site looks infected. Your symptoms persist after you  have completed your treatment plan. The burn has not healed in 14 days.  MAKE SURE YOU:  Understand these instructions. Will watch your condition. Will get help right away if you are not doing well or get worse.  Your e-visit answers were reviewed by a board certified advanced clinical practitioner to complete your personal care plan.  Depending upon the condition, your plan could have included both over the counter or prescription medications.    Please review your pharmacy choice.  Make sure the pharmacy is open so you can pick up prescription now.   If there is a problem, you may contact your provider through [employer] and have the prescription  routed to another pharmacy. Your safety is important to us .  If you have drug allergies check your prescription carefully.    For the next 24 hours you can use MyChart to ask questions about today's visit, request a non-urgent call back, or ask for a work or school excuse.  You will get an email in the next 2 days asking about your experience.  I hope that your e-visit has been valuable and will speed your recovery.   For an urgent face to face visit, [facility] has multiple urgent care centers for your convenience.  Click the link below for the full list of locations and hours, walk-in wait times, appointment scheduling options and driving directions:  Urgent Care - [place], [place], [place], [place], [place], [place]  [place]    I have spent 5 minutes in review of e-visit questionnaire, review and updating patient chart, medical decision making and response to patient.   Delon CHRISTELLA Dickinson, PA-C

## 2024-09-19 ENCOUNTER — Ambulatory Visit: Payer: Self-pay | Admitting: Family Medicine

## 2024-09-19 ENCOUNTER — Encounter: Payer: Self-pay | Admitting: Gastroenterology

## 2024-09-27 ENCOUNTER — Ambulatory Visit: Admitting: Gastroenterology

## 2024-09-27 ENCOUNTER — Encounter: Payer: Self-pay | Admitting: Gastroenterology

## 2024-09-27 VITALS — BP 112/77 | HR 80 | Temp 98.4°F | Resp 14 | Ht 73.0 in | Wt 251.0 lb

## 2024-09-27 DIAGNOSIS — D124 Benign neoplasm of descending colon: Secondary | ICD-10-CM

## 2024-09-27 DIAGNOSIS — K5730 Diverticulosis of large intestine without perforation or abscess without bleeding: Secondary | ICD-10-CM

## 2024-09-27 DIAGNOSIS — D122 Benign neoplasm of ascending colon: Secondary | ICD-10-CM | POA: Diagnosis not present

## 2024-09-27 DIAGNOSIS — Z1211 Encounter for screening for malignant neoplasm of colon: Secondary | ICD-10-CM | POA: Diagnosis present

## 2024-09-27 DIAGNOSIS — D125 Benign neoplasm of sigmoid colon: Secondary | ICD-10-CM | POA: Diagnosis not present

## 2024-09-27 MED ORDER — SODIUM CHLORIDE 0.9 % IV SOLN: 500.0000 mL | Freq: Once | INTRAVENOUS | Status: DC

## 2024-09-27 NOTE — Patient Instructions (Signed)
-  Handout on polyp provided. -await pathology results. -repeat colonoscopy for surveillance recommended. Date to be determined when pathology result become available.  -Continue present medications.  YOU HAD AN ENDOSCOPIC PROCEDURE TODAY AT THE [facility]:   Refer to the procedure report that was given to you for any specific questions about what was found during the examination.  If the procedure report does not answer your questions, please call your gastroenterologist to clarify.  If you requested that your care partner not be given the details of your procedure findings, then the procedure report has been included in a sealed envelope for you to review at your convenience later.  YOU SHOULD EXPECT: Some feelings of bloating in the abdomen. Passage of more gas than usual.  Walking can help get rid of the air that was put into your GI tract during the procedure and reduce the bloating. If you had a lower endoscopy (such as a colonoscopy or flexible sigmoidoscopy) you may notice spotting of blood in your stool or on the toilet paper. If you underwent a bowel prep for your procedure, you may not have a normal bowel movement for a few days.  Please Note:  You might notice some irritation and congestion in your nose or some drainage.  This is from the oxygen used during your procedure.  There is no need for concern and it should clear up in a day or so.  SYMPTOMS TO REPORT IMMEDIATELY:  Following lower endoscopy (colonoscopy or flexible sigmoidoscopy):  Excessive amounts of blood in the stool  Significant tenderness or worsening of abdominal pains  Swelling of the abdomen that is new, acute  Fever of 100F or higher  For urgent or emergent issues, a gastroenterologist can be reached at any hour by calling [phone]. Do not use MyChart messaging for urgent concerns.    DIET:  We do recommend a small meal at first, but then you may proceed to your regular diet.  Drink plenty of  fluids but you should avoid alcoholic beverages for 24 hours.  ACTIVITY:  You should plan to take it easy for the rest of today and you should NOT DRIVE or use heavy machinery until tomorrow (because of the sedation medicines used during the test).    FOLLOW UP: Our staff will call the number listed on your records the next business day following your procedure.  We will call around 7:15- 8:00 am to check on you and address any questions or concerns that you may have regarding the information given to you following your procedure. If we do not reach you, we will leave a message.     If any biopsies were taken you will be contacted by phone or by letter within the next 1-3 weeks.  Please call us  at [phone] if you have not heard about the biopsies in 3 weeks.    SIGNATURES/CONFIDENTIALITY: You and/or your care partner have signed paperwork which will be entered into your electronic medical record.  These signatures attest to the fact that that the information above on your After Visit Summary has been reviewed and is understood.  Full responsibility of the confidentiality of this discharge information lies with you and/or your care-partner.

## 2024-09-27 NOTE — Progress Notes (Signed)
 Called to room to assist during endoscopic procedure.  Patient ID and intended procedure confirmed with present staff. Received instructions for my participation in the procedure from the performing physician.

## 2024-09-27 NOTE — Op Note (Addendum)
 [facility] Patient Name: Juan Nelson Procedure Date: 09/27/2024 12:48 PM MRN: [id number] Endoscopist: Victory L. Legrand , MD, [id number] Age: 59 Referring MD:  Date of Birth: 01/21/1966 Gender: Male Account #: [id number] Procedure:                Colonoscopy Indications:              Screening for colorectal malignant neoplasm                           No polyps last colonoscopy in 2016 at [facility]:                Monitored Anesthesia Care Procedure:                Pre-Anesthesia Assessment:                           - Prior to the procedure, a History and Physical                            was performed, and patient medications and                            allergies were reviewed. The patient's tolerance of                            previous anesthesia was also reviewed. The risks                            and benefits of the procedure and the sedation                            options and risks were discussed with the patient.                            All questions were answered, and informed consent                            was obtained. Prior Anticoagulants: The patient has                            taken no anticoagulant or antiplatelet agents. ASA                            Grade Assessment: II - A patient with mild systemic                            disease. After reviewing the risks and benefits,                            the patient was deemed in satisfactory condition to                            undergo the procedure.  After obtaining informed consent, the colonoscope                            was passed under direct vision. Throughout the                            procedure, the patient's blood pressure, pulse, and                            oxygen saturations were monitored continuously. The                            CF [id number] #[id number] was introduced through the anus                            and advanced to the  the terminal ileum, with                            identification of the appendiceal orifice and IC                            valve. The colonoscopy was performed without                            difficulty. The patient tolerated the procedure                            well. The quality of the bowel preparation was                            excellent. The terminal ileum, ileocecal valve,                            appendiceal orifice, and rectum were photographed. Scope In: 1:16:59 PM Scope Out: 1:31:24 PM Scope Withdrawal Time: 0 hours 12 minutes 58 seconds  Total Procedure Duration: 0 hours 14 minutes 25 seconds  Findings:                 The perianal and digital rectal examinations were                            normal.                           The terminal ileum appeared normal.                           Repeat examination of right colon under NBI                            performed.                           Two sessile polyps were found in the descending  colon and ascending colon. The polyps were                            diminutive in size. These polyps were removed with                            a cold snare. Resection and retrieval were complete.                           Multiple diverticula were found in the left colon                            and right colon.                           A 6 mm polyp was found in the distal sigmoid colon.                            The polyp was pedunculated (and probably                            inflammatory by appearance). The polyp was removed                            with a hot snare. Resection and retrieval were                            complete.                           The exam was otherwise without abnormality on                            direct and retroflexion views. Complications:            No immediate complications. Estimated Blood Loss:     Estimated blood loss was  minimal. Impression:               - The examined portion of the ileum was normal.                           - Two diminutive polyps in the descending colon and                            in the ascending colon, removed with a cold snare.                            Resected and retrieved.                           - Diverticulosis in the left colon and in the right                            colon.                           -  One 6 mm polyp in the distal sigmoid colon,                            removed with a hot snare. Resected and retrieved.                           - The examination was otherwise normal on direct                            and retroflexion views. Recommendation:           - Patient has a contact number available for                            emergencies. The signs and symptoms of potential                            delayed complications were discussed with the                            patient. Return to normal activities tomorrow.                            Written discharge instructions were provided to the                            patient.                           - Resume previous diet.                           - Continue present medications.                           - Await pathology results.                           - Repeat colonoscopy is recommended for                            surveillance. The colonoscopy date will be                            determined after pathology results from today's                            exam become available for review. Lakia Gritton L. Legrand, MD 09/27/2024 1:36:04 PM This report has been signed electronically.

## 2024-09-27 NOTE — Progress Notes (Signed)
 Report to PACU, RN, vss, BBS= Clear.

## 2024-09-27 NOTE — Progress Notes (Signed)
 History and Physical:  This patient presents for endoscopic testing for: Encounter Diagnosis  Name Primary?   Special screening for malignant neoplasms, colon Yes    59 year old man here today for screening colonoscopy.  There is a family history of colon cancer in a grandparent and uncle.  No parents or siblings with colorectal cancer.  This patient also reports having had a colonoscopy at [facility] in 2016 where no polyps were discovered.  Patient denies chronic abdominal pain, rectal bleeding, constipation or diarrhea.   Patient is otherwise without complaints or active issues today.   Past Medical History: Past Medical History:  Diagnosis Date   Allergy    Arthritis    right knee   GERD (gastroesophageal reflux disease)    History of kidney stones    multople stones    Hyperlipidemia    Hypertension    Kidney stone    Sleep apnea    Urticaria      Past Surgical History: Past Surgical History:  Procedure Laterality Date   COLONOSCOPY     first one done at [facility] no polyps per pt   EXTRACORPOREAL SHOCK WAVE LITHOTRIPSY Right 11/05/2016   Procedure: RIGHT EXTRACORPOREAL SHOCK WAVE LITHOTRIPSY (ESWL);  Surgeon: [name], MD;  Location: [facility];  Service: Urology;  Laterality: Right;   GANGLION CYST EXCISION  1989   LAPAROSCOPIC GASTRIC BANDING     NEVUS EXCISION Right 04/28/2017   SPINE SURGERY     L-L5 fusion 2011    Allergies: Allergies[1]  Outpatient Meds: Current Outpatient Medications  Medication Sig Dispense Refill   acetaminophen  (TYLENOL ) 500 MG tablet Take 500 mg by mouth every 6 (six) hours as needed for headache.     allopurinol  (ZYLOPRIM ) 100 MG tablet Take 1 tablet (100 mg total) by mouth daily. 90 tablet 1   amLODipine  (NORVASC ) 2.5 MG tablet Take 1 tablet (2.5 mg total) by mouth daily. 90 tablet 1   aspirin EC 81 MG tablet Take 81 mg by mouth daily. Swallow whole.     cholecalciferol (VITAMIN D ) 1000 UNITS tablet Take 5,000 Units by mouth  daily.     Cobalamin Combinations (B-12) [phone] MCG SUBL Place under the tongue daily at 12 noon.     Fiber Adult Gummies 2 g CHEW Chew by mouth daily at 12 noon.     glucose blood (ONETOUCH VERIO) test strip Use as instructed 50 strip 1   glucose blood test strip See admin instructions.     lisinopril  (ZESTRIL ) 40 MG tablet Take 1 tablet (40 mg total) by mouth daily. 90 tablet 1   loratadine  (ALLERGY) 10 MG tablet Take 1 tablet (10 mg total) by mouth daily. Alegra D (Patient taking differently: Take 10 mg by mouth as needed. Alegra D) 90 tablet 1   metFORMIN  (GLUCOPHAGE ) 500 MG tablet Take 1 tablet (500 mg total) by mouth daily with breakfast. 90 tablet 1   Multiple Vitamin (MULTIVITAMIN WITH MINERALS) TABS Take 1 tablet by mouth daily.     colchicine  0.6 MG tablet Take two tabs once at the first sign of a gout flare and then one an hour later. Repeat in 24 hours if needed. 12 tablet 1   cyclobenzaprine (FLEXERIL) 10 MG tablet TAKE 1 TABLET(S) EVERY 6-8 HOURS AS NEEDED FOR SPASM (Patient taking differently: Take 10 mg by mouth as needed.)     EPINEPHrine  (AUVI-Q ) 0.3 mg/0.3 mL IJ SOAJ injection Inject 0.3 mg into the muscle as needed for anaphylaxis. (Patient not taking: Reported  on 09/27/2024) 2 each 2   fluticasone  (FLONASE ) 50 MCG/ACT nasal spray SPRAY 2 SPRAYS INTO EACH NOSTRIL EVERY DAY (Patient taking differently: as needed.) 48 mL 1   ipratropium (ATROVENT ) 0.03 % nasal spray PLACE 2 SPRAYS INTO BOTH NOSTRILS 3 (THREE) TIMES DAILY (Patient taking differently: Place 2 sprays into both nostrils as needed.) 30 mL 12   pantoprazole  (PROTONIX ) 40 MG tablet Take 1 tablet (40 mg total) by mouth daily. (Patient taking differently: Take 40 mg by mouth as needed.) 30 tablet 0   tadalafil (CIALIS) 20 MG tablet Take 20 mg by mouth daily as needed for erectile dysfunction.      tamsulosin  (FLOMAX ) 0.4 MG CAPS capsule Take 1 capsule (0.4 mg total) by mouth daily after supper. 30 capsule 0   WEGOVY  0.25  MG/0.5ML SOAJ SQ injection once a week.     XOLAIR  150 MG injection INJECT 300 MG UNDER THE SKIN EVERY 28 DAYS (MAY REQUIRE RECONSTITUTION/DILUTION REFER TO PACKAGE INSERT OR MEDICAL ORDER) 2 each 11   Current Facility-Administered Medications  Medication Dose Route Frequency Provider Last Rate Last Admin   0.9 %  sodium chloride  infusion  500 mL Intravenous Once Danis, Fabien Travelstead L III, MD       omalizumab  (XOLAIR ) injection 300 mg  300 mg Subcutaneous Q28 days [name], MD   300 mg at 09/13/24 9051      ___________________________________________________________________ Objective   Exam:  BP (!) 143/95   Pulse 77   Temp 98.4 F (36.9 C)   Ht 6' 1 (1.854 m)   Wt 251 lb (113.9 kg)   SpO2 97%   BMI 33.12 kg/m   CV: regular , S1/S2 Resp: clear to auscultation bilaterally, normal RR and effort noted GI: soft, no tenderness, with active bowel sounds.   Assessment: Encounter Diagnosis  Name Primary?   Special screening for malignant neoplasms, colon Yes     Plan: Colonoscopy   The benefits and risks of the planned procedure(s) were described in detail with the patient or (when appropriate) their health care proxy.  Risks were outlined as including, but not limited to, bleeding, infection, perforation, adverse medication reaction leading to cardiac or pulmonary decompensation, pancreatitis (if ERCP).  The limitation of incomplete mucosal visualization was also discussed.  No guarantees or warranties were given.  The patient was provided an opportunity to ask questions and all were answered. The patient agreed with the plan.   The patient is appropriate for an endoscopic procedure in the ambulatory setting.   - Victory Brand, MD        [1]  Allergies Allergen Reactions   Other Anaphylaxis    Pumpkin Seeds   Pumpkin Seed Hives and Swelling    Swelling of throat, tongue and lips. Swelling of throat, tongue and lips. Swelling of throat, tongue and lips.    Sesame Oil Hives    Other Reaction(s): Not available   Sesame Seed (Diagnostic) Anaphylaxis and Other (See Comments)   Bee Venom Swelling   Insect Extract Hives, Itching and Swelling   Mobic  [Meloxicam ] Nausea Only    just didn't feel well   Molds & Smuts Other (See Comments)    mold extract

## 2024-09-28 ENCOUNTER — Telehealth: Payer: Self-pay | Admitting: *Deleted

## 2024-09-28 NOTE — Telephone Encounter (Signed)
" °  Follow up Call-     09/27/2024   12:56 PM  Call back number  Post procedure Call Back phone  # [phone]  Permission to leave phone message Yes     Patient questions:  Do you have a fever, pain , or abdominal swelling? No. Pain Score  0 *  Have you tolerated food without any problems? Yes.    Have you been able to return to your normal activities? Yes.    Do you have any questions about your discharge instructions: Diet   No. Medications  No. Follow up visit  No.  Do you have questions or concerns about your Care? No.  Actions: * If pain score is 4 or above: No action needed, pain <4.   "

## 2024-10-02 ENCOUNTER — Ambulatory Visit (INDEPENDENT_AMBULATORY_CARE_PROVIDER_SITE_OTHER): Admitting: Otolaryngology

## 2024-10-03 ENCOUNTER — Ambulatory Visit: Payer: Self-pay | Admitting: Gastroenterology

## 2024-10-03 LAB — SURGICAL PATHOLOGY

## 2024-10-04 ENCOUNTER — Encounter: Payer: Self-pay | Admitting: Gastroenterology

## 2024-10-25 ENCOUNTER — Ambulatory Visit

## 2024-11-20 ENCOUNTER — Encounter: Admitting: Family Medicine

## 2025-01-04 ENCOUNTER — Ambulatory Visit: Admitting: Allergy & Immunology

## 2025-03-15 ENCOUNTER — Ambulatory Visit: Admitting: Family Medicine

## 2025-06-18 ENCOUNTER — Ambulatory Visit: Admitting: Family Medicine
